# Patient Record
Sex: Male | Born: 1970 | Race: White | Hispanic: No | Marital: Single | Smoking: Former smoker
Health system: Southern US, Community
[De-identification: ages and names within clinical notes are randomized; demographics above are authoritative.]

## PROBLEM LIST (undated history)

## (undated) DIAGNOSIS — E119 Type 2 diabetes mellitus without complications: Secondary | ICD-10-CM

## (undated) DIAGNOSIS — E039 Hypothyroidism, unspecified: Secondary | ICD-10-CM

## (undated) DIAGNOSIS — R4701 Aphasia: Secondary | ICD-10-CM

## (undated) DIAGNOSIS — I509 Heart failure, unspecified: Secondary | ICD-10-CM

## (undated) DIAGNOSIS — M1990 Unspecified osteoarthritis, unspecified site: Secondary | ICD-10-CM

## (undated) DIAGNOSIS — I739 Peripheral vascular disease, unspecified: Secondary | ICD-10-CM

## (undated) DIAGNOSIS — G4733 Obstructive sleep apnea (adult) (pediatric): Secondary | ICD-10-CM

## (undated) DIAGNOSIS — F32A Depression, unspecified: Secondary | ICD-10-CM

## (undated) DIAGNOSIS — J45909 Unspecified asthma, uncomplicated: Secondary | ICD-10-CM

## (undated) DIAGNOSIS — I10 Essential (primary) hypertension: Secondary | ICD-10-CM

## (undated) DIAGNOSIS — R51 Headache: Secondary | ICD-10-CM

## (undated) DIAGNOSIS — I639 Cerebral infarction, unspecified: Secondary | ICD-10-CM

## (undated) DIAGNOSIS — I69351 Hemiplegia and hemiparesis following cerebral infarction affecting right dominant side: Secondary | ICD-10-CM

## (undated) DIAGNOSIS — I2510 Atherosclerotic heart disease of native coronary artery without angina pectoris: Secondary | ICD-10-CM

## (undated) DIAGNOSIS — R0602 Shortness of breath: Secondary | ICD-10-CM

## (undated) DIAGNOSIS — F329 Major depressive disorder, single episode, unspecified: Secondary | ICD-10-CM

## (undated) DIAGNOSIS — D649 Anemia, unspecified: Secondary | ICD-10-CM

## (undated) HISTORY — PX: THYROIDECTOMY, PARTIAL: SHX18

---

## 2013-11-04 ENCOUNTER — Emergency Department (HOSPITAL_COMMUNITY): Payer: Self-pay

## 2013-11-04 ENCOUNTER — Inpatient Hospital Stay (HOSPITAL_COMMUNITY)
Admission: EM | Admit: 2013-11-04 | Discharge: 2013-11-08 | DRG: 193 | Disposition: A | Discharge status: Home / Self Care | Payer: Self-pay | Attending: Pulmonary Disease | Admitting: Pulmonary Disease

## 2013-11-04 ENCOUNTER — Encounter (HOSPITAL_COMMUNITY): Payer: Self-pay | Admitting: Emergency Medicine

## 2013-11-04 DIAGNOSIS — I509 Heart failure, unspecified: Secondary | ICD-10-CM | POA: Diagnosis present

## 2013-11-04 DIAGNOSIS — J690 Pneumonitis due to inhalation of food and vomit: Secondary | ICD-10-CM

## 2013-11-04 DIAGNOSIS — Z7982 Long term (current) use of aspirin: Secondary | ICD-10-CM

## 2013-11-04 DIAGNOSIS — E119 Type 2 diabetes mellitus without complications: Secondary | ICD-10-CM | POA: Diagnosis present

## 2013-11-04 DIAGNOSIS — R609 Edema, unspecified: Secondary | ICD-10-CM

## 2013-11-04 DIAGNOSIS — E039 Hypothyroidism, unspecified: Secondary | ICD-10-CM | POA: Diagnosis present

## 2013-11-04 DIAGNOSIS — J9600 Acute respiratory failure, unspecified whether with hypoxia or hypercapnia: Secondary | ICD-10-CM | POA: Diagnosis present

## 2013-11-04 DIAGNOSIS — I69959 Hemiplegia and hemiparesis following unspecified cerebrovascular disease affecting unspecified side: Secondary | ICD-10-CM

## 2013-11-04 DIAGNOSIS — J189 Pneumonia, unspecified organism: Principal | ICD-10-CM | POA: Diagnosis present

## 2013-11-04 DIAGNOSIS — E662 Morbid (severe) obesity with alveolar hypoventilation: Secondary | ICD-10-CM | POA: Diagnosis present

## 2013-11-04 DIAGNOSIS — Z6841 Body Mass Index (BMI) 40.0 and over, adult: Secondary | ICD-10-CM

## 2013-11-04 DIAGNOSIS — Z91199 Patient's noncompliance with other medical treatment and regimen due to unspecified reason: Secondary | ICD-10-CM

## 2013-11-04 DIAGNOSIS — J9601 Acute respiratory failure with hypoxia: Secondary | ICD-10-CM | POA: Diagnosis present

## 2013-11-04 DIAGNOSIS — Z794 Long term (current) use of insulin: Secondary | ICD-10-CM

## 2013-11-04 DIAGNOSIS — I10 Essential (primary) hypertension: Secondary | ICD-10-CM | POA: Diagnosis present

## 2013-11-04 DIAGNOSIS — I69920 Aphasia following unspecified cerebrovascular disease: Secondary | ICD-10-CM

## 2013-11-04 DIAGNOSIS — D72829 Elevated white blood cell count, unspecified: Secondary | ICD-10-CM | POA: Diagnosis present

## 2013-11-04 DIAGNOSIS — E739 Lactose intolerance, unspecified: Secondary | ICD-10-CM | POA: Diagnosis present

## 2013-11-04 DIAGNOSIS — N179 Acute kidney failure, unspecified: Secondary | ICD-10-CM | POA: Diagnosis present

## 2013-11-04 DIAGNOSIS — R0689 Other abnormalities of breathing: Secondary | ICD-10-CM | POA: Diagnosis present

## 2013-11-04 DIAGNOSIS — Z79899 Other long term (current) drug therapy: Secondary | ICD-10-CM

## 2013-11-04 DIAGNOSIS — E785 Hyperlipidemia, unspecified: Secondary | ICD-10-CM | POA: Diagnosis present

## 2013-11-04 DIAGNOSIS — I6990 Unspecified sequelae of unspecified cerebrovascular disease: Secondary | ICD-10-CM

## 2013-11-04 DIAGNOSIS — Z87891 Personal history of nicotine dependence: Secondary | ICD-10-CM

## 2013-11-04 DIAGNOSIS — E872 Acidosis, unspecified: Secondary | ICD-10-CM | POA: Diagnosis present

## 2013-11-04 DIAGNOSIS — G4733 Obstructive sleep apnea (adult) (pediatric): Secondary | ICD-10-CM | POA: Diagnosis present

## 2013-11-04 DIAGNOSIS — J811 Chronic pulmonary edema: Secondary | ICD-10-CM

## 2013-11-04 DIAGNOSIS — Z8679 Personal history of other diseases of the circulatory system: Secondary | ICD-10-CM

## 2013-11-04 DIAGNOSIS — J810 Acute pulmonary edema: Secondary | ICD-10-CM | POA: Diagnosis present

## 2013-11-04 DIAGNOSIS — Z9119 Patient's noncompliance with other medical treatment and regimen: Secondary | ICD-10-CM

## 2013-11-04 HISTORY — DX: Depression, unspecified: F32.A

## 2013-11-04 HISTORY — DX: Peripheral vascular disease, unspecified: I73.9

## 2013-11-04 HISTORY — DX: Essential (primary) hypertension: I10

## 2013-11-04 HISTORY — DX: Hypothyroidism, unspecified: E03.9

## 2013-11-04 HISTORY — DX: Heart failure, unspecified: I50.9

## 2013-11-04 HISTORY — DX: Headache: R51

## 2013-11-04 HISTORY — DX: Morbid (severe) obesity due to excess calories: E66.01

## 2013-11-04 HISTORY — DX: Aphasia: R47.01

## 2013-11-04 HISTORY — DX: Obstructive sleep apnea (adult) (pediatric): G47.33

## 2013-11-04 HISTORY — DX: Atherosclerotic heart disease of native coronary artery without angina pectoris: I25.10

## 2013-11-04 HISTORY — DX: Type 2 diabetes mellitus without complications: E11.9

## 2013-11-04 HISTORY — DX: Shortness of breath: R06.02

## 2013-11-04 HISTORY — DX: Hemiplegia and hemiparesis following cerebral infarction affecting right dominant side: I69.351

## 2013-11-04 HISTORY — DX: Major depressive disorder, single episode, unspecified: F32.9

## 2013-11-04 HISTORY — DX: Cerebral infarction, unspecified: I63.9

## 2013-11-04 HISTORY — DX: Unspecified asthma, uncomplicated: J45.909

## 2013-11-04 HISTORY — DX: Unspecified osteoarthritis, unspecified site: M19.90

## 2013-11-04 LAB — CBC WITH DIFFERENTIAL/PLATELET
Basophils Absolute: 0 10*3/uL (ref 0.0–0.1)
Basophils Relative: 0 % (ref 0–1)
EOS ABS: 0 10*3/uL (ref 0.0–0.7)
Eosinophils Relative: 0 % (ref 0–5)
HEMATOCRIT: 37.5 % — AB (ref 39.0–52.0)
Hemoglobin: 13 g/dL (ref 13.0–17.0)
LYMPHS PCT: 7 % — AB (ref 12–46)
Lymphs Abs: 2.1 10*3/uL (ref 0.7–4.0)
MCH: 30.3 pg (ref 26.0–34.0)
MCHC: 34.7 g/dL (ref 30.0–36.0)
MCV: 87.4 fL (ref 78.0–100.0)
MONOS PCT: 2 % — AB (ref 3–12)
Monocytes Absolute: 0.6 10*3/uL (ref 0.1–1.0)
NEUTROS ABS: 27.6 10*3/uL — AB (ref 1.7–7.7)
NEUTROS PCT: 91 % — AB (ref 43–77)
PLATELETS: 318 10*3/uL (ref 150–400)
RBC: 4.29 MIL/uL (ref 4.22–5.81)
RDW: 13.6 % (ref 11.5–15.5)
SMEAR REVIEW: ADEQUATE
WBC: 30.3 10*3/uL — AB (ref 4.0–10.5)

## 2013-11-04 LAB — BLOOD GAS, ARTERIAL
Acid-Base Excess: 1 mmol/L (ref 0.0–2.0)
Acid-base deficit: 0.2 mmol/L (ref 0.0–2.0)
Bicarbonate: 24.5 mEq/L — ABNORMAL HIGH (ref 20.0–24.0)
Bicarbonate: 25.8 mEq/L — ABNORMAL HIGH (ref 20.0–24.0)
DRAWN BY: 31288
DRAWN BY: 41308
Delivery systems: POSITIVE
Delivery systems: POSITIVE
EXPIRATORY PAP: 8
FIO2: 100 %
FIO2: 1 %
INSPIRATORY PAP: 20
LHR: 15 {breaths}/min
MODE: POSITIVE
Mode: POSITIVE
O2 SAT: 94.1 %
O2 Saturation: 94.5 %
PATIENT TEMPERATURE: 98.6
PCO2 ART: 46.5 mmHg — AB (ref 35.0–45.0)
PEEP: 8 cmH2O
PH ART: 7.363 (ref 7.350–7.450)
PO2 ART: 73 mmHg — AB (ref 80.0–100.0)
PRESSURE CONTROL: 12 cmH2O
Patient temperature: 99.5
TCO2: 25.9 mmol/L (ref 0–100)
TCO2: 27.2 mmol/L (ref 0–100)
pCO2 arterial: 45.2 mmHg — ABNORMAL HIGH (ref 35.0–45.0)
pH, Arterial: 7.357 (ref 7.350–7.450)
pO2, Arterial: 68.3 mmHg — ABNORMAL LOW (ref 80.0–100.0)

## 2013-11-04 LAB — URINE MICROSCOPIC-ADD ON

## 2013-11-04 LAB — URINALYSIS, ROUTINE W REFLEX MICROSCOPIC
Bilirubin Urine: NEGATIVE
Ketones, ur: NEGATIVE mg/dL
LEUKOCYTES UA: NEGATIVE
Nitrite: NEGATIVE
PROTEIN: 100 mg/dL — AB
SPECIFIC GRAVITY, URINE: 1.019 (ref 1.005–1.030)
UROBILINOGEN UA: 0.2 mg/dL (ref 0.0–1.0)
pH: 5 (ref 5.0–8.0)

## 2013-11-04 LAB — LACTIC ACID, PLASMA: LACTIC ACID, VENOUS: 0.9 mmol/L (ref 0.5–2.2)

## 2013-11-04 LAB — TROPONIN I
Troponin I: <0.3 ng/mL (ref <0.30)
Troponin I: <0.3 ng/mL (ref <0.30)
Troponin I: <0.3 ng/mL (ref <0.30)

## 2013-11-04 LAB — COMPREHENSIVE METABOLIC PANEL
ALT: 14 U/L (ref 0–53)
AST: 15 U/L (ref 0–37)
Albumin: 3.8 g/dL (ref 3.5–5.2)
Alkaline Phosphatase: 109 U/L (ref 39–117)
BUN: 22 mg/dL (ref 6–23)
CALCIUM: 9 mg/dL (ref 8.4–10.5)
CO2: 22 mEq/L (ref 19–32)
CREATININE: 1.28 mg/dL (ref 0.50–1.35)
Chloride: 96 mEq/L (ref 96–112)
GFR calc Af Amer: 78 mL/min — ABNORMAL LOW (ref >90)
GFR calc non Af Amer: 68 mL/min — ABNORMAL LOW (ref >90)
Glucose, Bld: 319 mg/dL — ABNORMAL HIGH (ref 70–99)
Potassium: 5.4 mEq/L — ABNORMAL HIGH (ref 3.7–5.3)
SODIUM: 133 meq/L — AB (ref 137–147)
TOTAL PROTEIN: 7.5 g/dL (ref 6.0–8.3)
Total Bilirubin: 1.3 mg/dL — ABNORMAL HIGH (ref 0.3–1.2)

## 2013-11-04 LAB — BASIC METABOLIC PANEL
BUN: 28 mg/dL — AB (ref 6–23)
CO2: 22 mEq/L (ref 19–32)
Calcium: 8.6 mg/dL (ref 8.4–10.5)
Chloride: 98 mEq/L (ref 96–112)
Creatinine, Ser: 1.98 mg/dL — ABNORMAL HIGH (ref 0.50–1.35)
GFR calc non Af Amer: 40 mL/min — ABNORMAL LOW (ref >90)
GFR, EST AFRICAN AMERICAN: 46 mL/min — AB (ref >90)
Glucose, Bld: 257 mg/dL — ABNORMAL HIGH (ref 70–99)
POTASSIUM: 4.1 meq/L (ref 3.7–5.3)
Sodium: 135 mEq/L — ABNORMAL LOW (ref 137–147)

## 2013-11-04 LAB — CBC
HEMATOCRIT: 33.5 % — AB (ref 39.0–52.0)
Hemoglobin: 11.5 g/dL — ABNORMAL LOW (ref 13.0–17.0)
MCH: 30.3 pg (ref 26.0–34.0)
MCHC: 34.3 g/dL (ref 30.0–36.0)
MCV: 88.4 fL (ref 78.0–100.0)
Platelets: 261 10*3/uL (ref 150–400)
RBC: 3.79 MIL/uL — ABNORMAL LOW (ref 4.22–5.81)
RDW: 13.8 % (ref 11.5–15.5)
WBC: 19.9 10*3/uL — ABNORMAL HIGH (ref 4.0–10.5)

## 2013-11-04 LAB — GLUCOSE, CAPILLARY
GLUCOSE-CAPILLARY: 204 mg/dL — AB (ref 70–99)
GLUCOSE-CAPILLARY: 247 mg/dL — AB (ref 70–99)
Glucose-Capillary: 257 mg/dL — ABNORMAL HIGH (ref 70–99)
Glucose-Capillary: 195 mg/dL — ABNORMAL HIGH (ref 70–99)
Glucose-Capillary: 166 mg/dL — ABNORMAL HIGH (ref 70–99)
Glucose-Capillary: 224 mg/dL — ABNORMAL HIGH (ref 70–99)
Glucose-Capillary: 192 mg/dL — ABNORMAL HIGH (ref 70–99)

## 2013-11-04 LAB — I-STAT ARTERIAL BLOOD GAS, ED
Acid-base deficit: 2 mmol/L (ref 0.0–2.0)
Bicarbonate: 25.1 mEq/L — ABNORMAL HIGH (ref 20.0–24.0)
O2 SAT: 88 %
PCO2 ART: 53.2 mmHg — AB (ref 35.0–45.0)
PH ART: 7.282 — AB (ref 7.350–7.450)
TCO2: 27 mmol/L (ref 0–100)
pO2, Arterial: 61 mmHg — ABNORMAL LOW (ref 80.0–100.0)

## 2013-11-04 LAB — HEMOGLOBIN A1C
Hgb A1c MFr Bld: 8.8 % — ABNORMAL HIGH (ref <5.7)
Mean Plasma Glucose: 206 mg/dL — ABNORMAL HIGH (ref <117)

## 2013-11-04 LAB — I-STAT TROPONIN, ED: TROPONIN I, POC: 0.02 ng/mL (ref 0.00–0.08)

## 2013-11-04 LAB — INFLUENZA PANEL BY PCR (TYPE A & B)
H1N1 flu by pcr: NOT DETECTED
Influenza A By PCR: NEGATIVE
Influenza B By PCR: NEGATIVE

## 2013-11-04 LAB — MRSA PCR SCREENING: MRSA by PCR: NEGATIVE

## 2013-11-04 LAB — I-STAT CG4 LACTIC ACID, ED: LACTIC ACID, VENOUS: 1.27 mmol/L (ref 0.5–2.2)

## 2013-11-04 LAB — CBG MONITORING, ED: Glucose-Capillary: 264 mg/dL — ABNORMAL HIGH (ref 70–99)

## 2013-11-04 LAB — PRO B NATRIURETIC PEPTIDE: Pro B Natriuretic peptide (BNP): 110.1 pg/mL (ref 0–125)

## 2013-11-04 LAB — TSH: TSH: 0.456 u[IU]/mL (ref 0.350–4.500)

## 2013-11-04 MED ORDER — ALBUTEROL SULFATE (2.5 MG/3ML) 0.083% IN NEBU: 2.5000 mg | INHALATION_SOLUTION | RESPIRATORY_TRACT | Status: DC

## 2013-11-04 MED ORDER — IPRATROPIUM BROMIDE 0.02 % IN SOLN: 0.5000 mg | RESPIRATORY_TRACT | Status: DC | PRN

## 2013-11-04 MED ORDER — FUROSEMIDE 10 MG/ML IJ SOLN
80.0000 mg | Freq: Two times a day (BID) | INTRAMUSCULAR | Status: DC
  Administered 2013-11-04: 80 mg via INTRAVENOUS
  Filled 2013-11-04 (×3): qty 8

## 2013-11-04 MED ORDER — ALBUTEROL SULFATE (2.5 MG/3ML) 0.083% IN NEBU
2.5000 mg | INHALATION_SOLUTION | Freq: Four times a day (QID) | RESPIRATORY_TRACT | Status: DC
  Administered 2013-11-04: 2.5 mg via RESPIRATORY_TRACT
  Filled 2013-11-04: qty 3

## 2013-11-04 MED ORDER — CHLORHEXIDINE GLUCONATE 0.12 % MT SOLN
15.0000 mL | Freq: Two times a day (BID) | OROMUCOSAL | Status: DC
  Administered 2013-11-04 – 2013-11-08 (×7): 15 mL via OROMUCOSAL
  Filled 2013-11-04 (×12): qty 15

## 2013-11-04 MED ORDER — ASPIRIN EC 81 MG PO TBEC
81.0000 mg | DELAYED_RELEASE_TABLET | Freq: Every day | ORAL | Status: DC
  Administered 2013-11-04 – 2013-11-08 (×5): 81 mg via ORAL
  Filled 2013-11-04 (×5): qty 1

## 2013-11-04 MED ORDER — ENOXAPARIN SODIUM 40 MG/0.4ML ~~LOC~~ SOLN
40.0000 mg | SUBCUTANEOUS | Status: DC
  Administered 2013-11-05 – 2013-11-08 (×4): 40 mg via SUBCUTANEOUS
  Filled 2013-11-04 (×6): qty 0.4

## 2013-11-04 MED ORDER — INSULIN GLARGINE 100 UNIT/ML ~~LOC~~ SOLN
25.0000 [IU] | Freq: Every day | SUBCUTANEOUS | Status: DC
  Administered 2013-11-04 – 2013-11-07 (×4): 25 [IU] via SUBCUTANEOUS
  Filled 2013-11-04 (×6): qty 0.25

## 2013-11-04 MED ORDER — ALBUTEROL SULFATE (2.5 MG/3ML) 0.083% IN NEBU
2.5000 mg | INHALATION_SOLUTION | Freq: Four times a day (QID) | RESPIRATORY_TRACT | Status: DC
  Administered 2013-11-04 – 2013-11-06 (×9): 2.5 mg via RESPIRATORY_TRACT
  Filled 2013-11-04 (×9): qty 3

## 2013-11-04 MED ORDER — DEXTROSE 5 % IV SOLN
500.0000 mg | INTRAVENOUS | Status: DC
  Administered 2013-11-04 – 2013-11-05 (×2): 500 mg via INTRAVENOUS
  Filled 2013-11-04 (×4): qty 500

## 2013-11-04 MED ORDER — BIOTENE DRY MOUTH MT LIQD
15.0000 mL | Freq: Two times a day (BID) | OROMUCOSAL | Status: DC
  Administered 2013-11-04 – 2013-11-08 (×8): 15 mL via OROMUCOSAL

## 2013-11-04 MED ORDER — DEXTROSE 5 % IV SOLN: 500.0000 mg | Freq: Once | INTRAVENOUS | Status: DC

## 2013-11-04 MED ORDER — NYSTATIN 100000 UNIT/GM EX POWD
Freq: Three times a day (TID) | CUTANEOUS | Status: DC
  Administered 2013-11-04 – 2013-11-08 (×13): via TOPICAL
  Filled 2013-11-04: qty 15

## 2013-11-04 MED ORDER — CARVEDILOL 3.125 MG PO TABS
3.1250 mg | ORAL_TABLET | Freq: Two times a day (BID) | ORAL | Status: DC
  Administered 2013-11-04: 3.125 mg via ORAL
  Filled 2013-11-04 (×3): qty 1

## 2013-11-04 MED ORDER — DEXTROSE 5 % IV SOLN
1.0000 g | INTRAVENOUS | Status: DC
  Filled 2013-11-04: qty 10

## 2013-11-04 MED ORDER — IPRATROPIUM-ALBUTEROL 0.5-2.5 (3) MG/3ML IN SOLN: 3.0000 mL | RESPIRATORY_TRACT | Status: DC | PRN

## 2013-11-04 MED ORDER — ATORVASTATIN CALCIUM 80 MG PO TABS
80.0000 mg | ORAL_TABLET | Freq: Every day | ORAL | Status: DC
  Administered 2013-11-06 – 2013-11-07 (×2): 80 mg via ORAL
  Filled 2013-11-04 (×6): qty 1

## 2013-11-04 MED ORDER — ALBUTEROL SULFATE (2.5 MG/3ML) 0.083% IN NEBU: 2.5000 mg | INHALATION_SOLUTION | RESPIRATORY_TRACT | Status: DC | PRN

## 2013-11-04 MED ORDER — CARVEDILOL 3.125 MG PO TABS
3.1250 mg | ORAL_TABLET | Freq: Two times a day (BID) | ORAL | Status: DC
  Administered 2013-11-05 – 2013-11-08 (×7): 3.125 mg via ORAL
  Filled 2013-11-04 (×11): qty 1

## 2013-11-04 MED ORDER — FUROSEMIDE 10 MG/ML IJ SOLN
80.0000 mg | Freq: Once | INTRAMUSCULAR | Status: AC
  Administered 2013-11-04: 80 mg via INTRAVENOUS
  Filled 2013-11-04: qty 8

## 2013-11-04 MED ORDER — INSULIN ASPART 100 UNIT/ML ~~LOC~~ SOLN: 0.0000 [IU] | SUBCUTANEOUS | Status: DC

## 2013-11-04 MED ORDER — ENOXAPARIN SODIUM 30 MG/0.3ML ~~LOC~~ SOLN
30.0000 mg | SUBCUTANEOUS | Status: DC
  Administered 2013-11-04: 30 mg via SUBCUTANEOUS
  Filled 2013-11-04 (×2): qty 0.3

## 2013-11-04 MED ORDER — LEVOTHYROXINE SODIUM 200 MCG PO TABS
200.0000 ug | ORAL_TABLET | Freq: Every day | ORAL | Status: DC
  Administered 2013-11-05 – 2013-11-08 (×5): 200 ug via ORAL
  Filled 2013-11-04 (×5): qty 1

## 2013-11-04 MED ORDER — IPRATROPIUM-ALBUTEROL 0.5-2.5 (3) MG/3ML IN SOLN: 3.0000 mL | Freq: Once | RESPIRATORY_TRACT | Status: DC

## 2013-11-04 MED ORDER — SODIUM CHLORIDE 0.9 % IV SOLN: 250.0000 mL | INTRAVENOUS | Status: DC | PRN

## 2013-11-04 MED ORDER — INSULIN ASPART 100 UNIT/ML ~~LOC~~ SOLN
0.0000 [IU] | SUBCUTANEOUS | Status: DC
  Administered 2013-11-04: 3 [IU] via SUBCUTANEOUS
  Administered 2013-11-04: 8 [IU] via SUBCUTANEOUS
  Administered 2013-11-04: 3 [IU] via SUBCUTANEOUS
  Administered 2013-11-04: 8 [IU] via SUBCUTANEOUS
  Administered 2013-11-04: 3 [IU] via SUBCUTANEOUS
  Administered 2013-11-04: 2 [IU] via SUBCUTANEOUS
  Administered 2013-11-05: 3 [IU] via SUBCUTANEOUS
  Administered 2013-11-05: 11 [IU] via SUBCUTANEOUS
  Administered 2013-11-05: 5 [IU] via SUBCUTANEOUS
  Administered 2013-11-05: 3 [IU] via SUBCUTANEOUS
  Administered 2013-11-05: 8 [IU] via SUBCUTANEOUS
  Administered 2013-11-05 – 2013-11-06 (×2): 3 [IU] via SUBCUTANEOUS

## 2013-11-04 MED ORDER — VITAMIN D (ERGOCALCIFEROL) 1.25 MG (50000 UNIT) PO CAPS
50000.0000 [IU] | ORAL_CAPSULE | ORAL | Status: DC
  Administered 2013-11-06 – 2013-11-08 (×2): 50000 [IU] via ORAL
  Filled 2013-11-04 (×2): qty 1

## 2013-11-04 MED ORDER — GABAPENTIN 100 MG PO CAPS
100.0000 mg | ORAL_CAPSULE | Freq: Two times a day (BID) | ORAL | Status: DC
  Administered 2013-11-04 – 2013-11-08 (×9): 100 mg via ORAL
  Filled 2013-11-04 (×10): qty 1

## 2013-11-04 MED ORDER — PIPERACILLIN-TAZOBACTAM 3.375 G IVPB
3.3750 g | Freq: Three times a day (TID) | INTRAVENOUS | Status: DC
  Administered 2013-11-04 – 2013-11-06 (×7): 3.375 g via INTRAVENOUS
  Filled 2013-11-04 (×10): qty 50

## 2013-11-04 MED ORDER — BUPROPION HCL 75 MG PO TABS
75.0000 mg | ORAL_TABLET | Freq: Two times a day (BID) | ORAL | Status: DC
  Administered 2013-11-04 – 2013-11-08 (×9): 75 mg via ORAL
  Filled 2013-11-04 (×10): qty 1

## 2013-11-04 MED ORDER — SODIUM CHLORIDE 0.9 % IJ SOLN: 3.0000 mL | INTRAMUSCULAR | Status: DC | PRN

## 2013-11-04 MED ORDER — NEBIVOLOL HCL 2.5 MG PO TABS: 2.5000 mg | ORAL_TABLET | Freq: Every day | ORAL | Status: DC

## 2013-11-04 MED ORDER — FUROSEMIDE 10 MG/ML IJ SOLN
40.0000 mg | Freq: Two times a day (BID) | INTRAMUSCULAR | Status: AC
  Administered 2013-11-04 – 2013-11-05 (×2): 40 mg via INTRAVENOUS
  Filled 2013-11-04: qty 4

## 2013-11-04 MED ORDER — DEXTROSE 5 % IV SOLN
1.0000 g | Freq: Once | INTRAVENOUS | Status: AC
  Administered 2013-11-04: 1 g via INTRAVENOUS
  Filled 2013-11-04: qty 10

## 2013-11-04 MED ORDER — SODIUM CHLORIDE 0.9 % IJ SOLN
3.0000 mL | Freq: Two times a day (BID) | INTRAMUSCULAR | Status: DC
  Administered 2013-11-04 – 2013-11-07 (×8): 3 mL via INTRAVENOUS

## 2013-11-04 NOTE — ED Provider Notes (Signed)
Ultrasound Guided Angiocath insertion Performed by: Pilar JarvisBrtalik, Laurinda Carreno  Consent: Verbal consent obtained. Risks and benefits: risks, benefits and alternatives were discussed Time out: Immediately prior to procedure a "time out" was called to verify the correct patient, procedure, equipment, support staff and site/side marked as required. Preparation: Patient was prepped and draped in the usual sterile fashion. Vein Location: L AC Ultrasound Guided Gauge: 20 ga Normal blood return and flush without difficulty Patient tolerance: Patient tolerated the procedure well with no immediate complications.     Pilar Jarvisoug Kenyata Napier, MD 11/04/13 0030

## 2013-11-04 NOTE — Progress Notes (Signed)
Pt transported on Servo-bipap to 45M w/ no apparent complications.  Report given to unit RT.

## 2013-11-04 NOTE — ED Notes (Signed)
Patient has been experiencing respiratory distress x2 days which worsened today. EMS placed patient on CPAP and got a low sat of 60% and a high sat of 88%. Patient has rales in all 4 lobes. 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 Patient has peripheral swelling which wife states is worse than normal. BP 128/86 HR 90.

## 2013-11-04 NOTE — ED Provider Notes (Signed)
CSN: [id number]     Arrival date & time 11/04/13  0011 History   First MD Initiated Contact with Patient 11/04/13 0022     Chief Complaint  Patient presents with  . Respiratory Distress     (Consider location/radiation/quality/duration/timing/severity/associated sxs/prior Treatment) The history is provided by the EMS personnel. The history is limited by the condition of the patient (Altered mental status).   43 year old male is brought in by EMS because of respiratory difficulty. Patient is unable to give any history but he apparently has been having difficulty breathing for 2 days. He was noted to be hypoxic en route. He was started on BiPAP on arrival in the ED.  History reviewed. No pertinent past medical history. History reviewed. No pertinent past surgical history. No family history on file. History  Substance Use Topics  . Smoking status: Not on file  . Smokeless tobacco: Not on file  . Alcohol Use: Not on file    Review of Systems  Unable to perform ROS: Mental status change      Allergies  Review of patient's allergies indicates not on file.  Home Medications  No current outpatient prescriptions on file. BP 122/77  Temp(Src) 98.4 F (36.9 C) (Axillary)  Resp 33  SpO2 98% Physical Exam  Nursing note and vitals reviewed.  Morbidly obese 43 year old male, resting comfortably and in no acute distress. Vital signs are significant for tachypnea with respiratory rate of. Oxygen saturation is 98%, which is normal. Head is normocephalic and atraumatic. PERRLA, EOMI. Oropharynx is clear. Neck is nontender and supple without adenopathy or JVD. Back is nontender and there is no CVA tenderness. Lungs have scattered the expiratory wheezes and bibasilar rales. Chest is nontender. Heart has regular rate and rhythm without murmur. Abdomen is soft, flat, nontender without masses or hepatosplenomegaly and peristalsis is normoactive. Extremities have 3+ edema, full range of  motion is present. Skin is warm and dry without rash. Neurologic: He is awake but non-verbal, does not follow commands, cranial nerves are intact, there are no gross motor or sensory deficits.  ED Course  Procedures (including critical care time) Labs Review Results for orders placed during the hospital encounter of 11/04/13  CBC WITH DIFFERENTIAL      Result Value Ref Range   WBC 30.3 (*) 4.0 - 10.5 K/uL   RBC 4.29  4.22 - 5.81 MIL/uL   Hemoglobin 13.0  13.0 - 17.0 g/dL   HCT 74.2 (*) 59.5 - 63.8 %   MCV 87.4  78.0 - 100.0 fL   MCH 30.3  26.0 - 34.0 pg   MCHC 34.7  30.0 - 36.0 g/dL   RDW 75.6  43.3 - 29.5 %   Platelets 318  150 - 400 K/uL   Neutrophils Relative % 91 (*) 43 - 77 %   Lymphocytes Relative 7 (*) 12 - 46 %   Monocytes Relative 2 (*) 3 - 12 %   Eosinophils Relative 0  0 - 5 %   Basophils Relative 0  0 - 1 %   Neutro Abs 27.6 (*) 1.7 - 7.7 K/uL   Lymphs Abs 2.1  0.7 - 4.0 K/uL   Monocytes Absolute 0.6  0.1 - 1.0 K/uL   Eosinophils Absolute 0.0  0.0 - 0.7 K/uL   Basophils Absolute 0.0  0.0 - 0.1 K/uL   RBC Morphology POLYCHROMASIA PRESENT     WBC Morphology MILD LEFT SHIFT (1-5% METAS, OCC MYELO, OCC BANDS)     Smear Review PLATELETS  APPEAR ADEQUATE    COMPREHENSIVE METABOLIC PANEL      Result Value Ref Range   Sodium 133 (*) 137 - 147 mEq/L   Potassium 5.4 (*) 3.7 - 5.3 mEq/L   Chloride 96  96 - 112 mEq/L   CO2 22  19 - 32 mEq/L   Glucose, Bld 319 (*) 70 - 99 mg/dL   BUN 22  6 - 23 mg/dL   Creatinine, Ser 1.61  0.50 - 1.35 mg/dL   Calcium 9.0  8.4 - 09.6 mg/dL   Total Protein 7.5  6.0 - 8.3 g/dL   Albumin 3.8  3.5 - 5.2 g/dL   AST 15  0 - 37 U/L   ALT 14  0 - 53 U/L   Alkaline Phosphatase 109  39 - 117 U/L   Total Bilirubin 1.3 (*) 0.3 - 1.2 mg/dL   GFR calc non Af Amer 68 (*) >90 mL/min   GFR calc Af Amer 78 (*) >90 mL/min  PRO B NATRIURETIC PEPTIDE      Result Value Ref Range   Pro B Natriuretic peptide (BNP) 110.1  0 - 125 pg/mL  URINALYSIS, ROUTINE  W REFLEX MICROSCOPIC      Result Value Ref Range   Color, Urine YELLOW  YELLOW   APPearance CLOUDY (*) CLEAR   Specific Gravity, Urine 1.019  1.005 - 1.030   pH 5.0  5.0 - 8.0   Glucose, UA >1000 (*) NEGATIVE mg/dL   Hgb urine dipstick LARGE (*) NEGATIVE   Bilirubin Urine NEGATIVE  NEGATIVE   Ketones, ur NEGATIVE  NEGATIVE mg/dL   Protein, ur 045 (*) NEGATIVE mg/dL   Urobilinogen, UA 0.2  0.0 - 1.0 mg/dL   Nitrite NEGATIVE  NEGATIVE   Leukocytes, UA NEGATIVE  NEGATIVE  URINE MICROSCOPIC-ADD ON      Result Value Ref Range   WBC, UA 0-2  <3 WBC/hpf   RBC / HPF 21-50  <3 RBC/hpf   Bacteria, UA RARE  RARE   Casts HYALINE CASTS (*) NEGATIVE  I-STAT CG4 LACTIC ACID, ED      Result Value Ref Range   Lactic Acid, Venous 1.27  0.5 - 2.2 mmol/L  I-STAT TROPOININ, ED      Result Value Ref Range   Troponin i, poc 0.02  0.00 - 0.08 ng/mL   Comment 3           I-STAT ARTERIAL BLOOD GAS, ED      Result Value Ref Range   pH, Arterial 7.282 (*) 7.350 - 7.450   pCO2 arterial 53.2 (*) 35.0 - 45.0 mmHg   pO2, Arterial 61.0 (*) 80.0 - 100.0 mmHg   Bicarbonate 25.1 (*) 20.0 - 24.0 mEq/L   TCO2 27  0 - 100 mmol/L   O2 Saturation 88.0     Acid-base deficit 2.0  0.0 - 2.0 mmol/L   Patient temperature 98.6 F     Collection site RADIAL, ALLEN'S TEST ACCEPTABLE     Drawn by Operator     Sample type ARTERIAL    CBG MONITORING, ED      Result Value Ref Range   Glucose-Capillary 264 (*) 70 - 99 mg/dL   Imaging Review Dg Chest Port 1 View  11/04/2013   CLINICAL DATA:  Shortness of breath  EXAM: PORTABLE CHEST - 1 VIEW  COMPARISON:  None.  FINDINGS: Multifocal patchy opacities in the lungs bilaterally. Differential considerations include multifocal pneumonia or moderate to severe pulmonary edema.  No pneumothorax.  Cardiomegaly.  IMPRESSION: Multifocal patchy opacities bilaterally, possibly reflecting multifocal pneumonia or moderate to severe pulmonary edema.   Electronically Signed   By: Charline BillsSriyesh   Krishnan M.D.   On: 11/04/2013 00:52   Images viewed by me.   EKG Interpretation   Date/Time:  Saturday November 04 2013 00:24:54 EDT Ventricular Rate:  90 PR Interval:  159 QRS Duration: 96 QT Interval:  400 QTC Calculation: 489 R Axis:   -17 Text Interpretation:  Age not entered, assumed to be  43 years old for  purpose of ECG interpretation Sinus rhythm Probable left atrial  enlargement Borderline left axis deviation Anteroseptal infarct, age  indeterminate T wave inversion in the  Lateral leads No old tracing to  compare Confirmed by [facility]  MD, Martesha Niedermeier ([id number]) on 11/04/2013 2:16:38 AM      CRITICAL CARE Performed by: FAOZH,YQMVHGLICK,Ramond Darnell Total critical care time: 65 minutes Critical care time was exclusive of separately billable procedures and treating other patients. Critical care was necessary to treat or prevent imminent or life-threatening deterioration. Critical care was time spent personally by me on the following activities: development of treatment plan with patient and/or surrogate as well as nursing, discussions with consultants, evaluation of patient's response to treatment, examination of patient, obtaining history from patient or surrogate, ordering and performing treatments and interventions, ordering and review of laboratory studies, ordering and review of radiographic studies, pulse oximetry and re-evaluation of patient's condition.  MDM   Final diagnoses:  Acute respiratory failure  Peripheral edema  Leukocytosis    Respiratory failure which appears to have multiple causes. He clinically does appear to have CHF and certainly has some component of pickwickian syndrome. He may also have component of congestive heart failure. On BiPAP, he is maintaining adequate oxygen saturations. Workup has been initiated. He has no old records at this institution.  He continues to maintain adequate oxygen saturations on BiPAP. X-ray shows almost complete white out of lungs which is  probably due to CHF. However, BNP has actually come back normal. WBC is markedly elevated at 30,000. Therefore, he is empirically started on antibiotics to treat community-acquired pneumonia with ceftriaxone and azithromycin. He is also started on furosemide. Blood gas appears to be venous based on O2 saturation relative to pulse oximetry and shows modest CO2 retention. Case is discussed with Dr. Lovell SheehanJenkins of [facility] who agrees to admit the patient.  Ultrasound guided IV placement was done by resident under my direct supervision. I was present for all key components of the procedure.  Dione Boozeavid Eilah Common, MD 11/04/13 [phone]

## 2013-11-04 NOTE — Progress Notes (Signed)
Inpatient Diabetes Program Recommendations  AACE/ADA: New Consensus Statement on Inpatient Glycemic Control (2013)  Target Ranges:  Prepandial:   less than 140 mg/dL      Peak postprandial:   less than 180 mg/dL (1-2 hours)      Critically ill patients:  140 - 180 mg/dL    Results for Patrick Case, Patrick Case (MRN [id number]) as of 11/04/2013 13:04  Ref. Range 11/04/2013 00:29 11/04/2013 04:15 11/04/2013 08:43 11/04/2013 08:59 11/04/2013 11:52  Glucose-Capillary Latest Range: 70-99 mg/dL 213264 (H) 086257 (H) 578204 (H) 195 (H) 166 (H)      Reason for Assessment: Received referral for this patient. Patient admitted with SOB/PNA.  Has history of DM2, CVA. HTN.  Diabetes history: Type 2 DM Home DM meds: Lantus 25 units QHS + Humalog 3-15 units tid per SSI [facility] [place] (per Dr. Clarnce FlockHarvette's H&P)   **Patient was started on Novolog Moderate SSI Q4 hours at 4am.  CBGs have started to improve.  **Note patient to start his home dose of Lantus tonight.  A1c has been ordered to assess pre-hospital glucose control.  **Patient to transfer to unit 56M ICU today.   Will follow. Ambrose FinlandJeannine Johnston Raziel Koenigs RN, MSN, CDE Diabetes Coordinator Inpatient Diabetes Program Team Pager: [phone] (8a-10p)

## 2013-11-04 NOTE — Progress Notes (Signed)
[facility] PROGRESS NOTE  Quentin Mullingatrick Yebra ZOX:[id number]:[id number] DOB: 08/15/1970 DOA: 11/04/2013 PCP: No PCP Per Patient  Brief narrative: 43 year old male with past medical history of CHF (no records of 2 D ECHO in EPIC but this admission ECHO showed normal EF), history of CVA and right sided hemiparesis, hypertension, diabetes, OHS/OSA who [facility]04/2013 with worsening shortness of breath, cough, orthopnea and leg swelling for past few days prior to this admission. Patient reported cough productive of yellowish sputum. In ED, patient was found to be hypoxic based on ABG findings of pH 7.28, pCO2 53 and pO2 61. BP was 114/49, Tmax was 100.1 F and RR 34, HR 94. CXR showed multifocal pneumonia and possible pulmonary edema. His BNP was WNL. Blood work further showed leukocytosis of 30. He was started on azithromycin and rocephin for treatment of pneumonia. He also required BiPAP on admission.  Assessment/Plan:  Principal Problem:   Acute respiratory failure with hypoxia  - likely due to OSA/OHS and pneumonia - respiratory status stable on BiPAP. Appreciate PCCM consult and recommendations - pt is on azithromycin and rocephin; blood cultures were not obtained at the time of the admission but we will obtain it now; also follow up on legionella, strep pneumonia, influenza and resp culture results - Albuterol and Atrovent nebulizers as needed and scheduled  Active Problems:   CAP (community acquired pneumonia) - on azithromycin and rocephin - follow up blood culture, resp culture, legionella, strep pneumonia, influenza results   OSA (obstructive sleep apnea), Obesity hypoventilation syndrome - on BiPAP   History of CHF (congestive heart failure) - normal BNP and normal EF on this admission - will restart low dose coreg 3.125 mg BID and hold Norvasc and lisinopril due to soft BP - lasix 40 mg IV BID   Hypertension - Soft BP so hold norvasc and lisinopril - will continue coreg but low  dose 3.125 mg BID   Diabetes mellitus - check A1c - restart home Lantus 25 units at bedtime - gabapentin for neuropathy   Unspecified hypothyroidism - continue levothyroxine   Leukocytosis - secondary to pneumonia   Morbid obesity - nutrition consulted   Code Status: full code Family Communication: no family at the bedside  Disposition Plan: home when stable   Manson PasseyEVINE, Ariannie Penaloza, MD  [facility] Pager [phone]  If 7PM-7AM, please contact night-coverage [email or web address] Password [email or web address] 11/04/2013, 10:30 AM   LOS: 0 days   Consultants:  PCCM  Procedures:  None   Antibiotics:  Azithromycin 3/28 -->  Rocephin 3/28 -->  HPI/Subjective: On BiPAP, no distress.  Objective: Filed Vitals:   11/04/13 0500 11/04/13 0606 11/04/13 0800 11/04/13 0806  BP:  114/49 111/52   Pulse:  86 92   Temp:   100.1 F (37.8 C)   TempSrc:   Axillary   Resp:   28   Height:      Weight: 144.5 kg (318 lb 9 oz)     SpO2:   97% 95%    Intake/Output Summary (Last 24 hours) at 11/04/13 1030 Last data filed at 11/04/13 0700  Gross per 24 hour  Intake    160 ml  Output   1650 ml  Net  -1490 ml    Exam:   General:  Pt is alert, has BiPAP, morbidly obese  Cardiovascular: Regular rate and rhythm, S1/S2 appreciated  Respiratory: diminished breath sounds bilaterally, no wheezing   Abdomen: Soft, non tender, obese, non distended, bowel sounds present, no guarding  Extremities: +  1-2 LE pitting edema, pulses DP and PT palpable bilaterally  Neuro: Grossly nonfocal  Data Reviewed: Basic Metabolic Panel:  Recent Labs Lab 11/04/13 0031  NA 133*  K 5.4*  CL 96  CO2 22  GLUCOSE 319*  BUN 22  CREATININE 1.28  CALCIUM 9.0   Liver Function Tests:  Recent Labs Lab 11/04/13 0031  AST 15  ALT 14  ALKPHOS 109  BILITOT 1.3*  PROT 7.5  ALBUMIN 3.8   No results found for this basename: LIPASE, AMYLASE,  in the last 168 hours No results found for this basename: AMMONIA,  in  the last 168 hours CBC:  Recent Labs Lab 11/04/13 0031  WBC 30.3*  NEUTROABS 27.6*  HGB 13.0  HCT 37.5*  MCV 87.4  PLT 318   Cardiac Enzymes:  Recent Labs Lab 11/04/13 0600  TROPONINI <0.30   BNP: No components found with this basename: POCBNP,  CBG:  Recent Labs Lab 11/04/13 0029 11/04/13 0415 11/04/13 0843 11/04/13 0859  GLUCAP 264* 257* 204* 195*    MRSA PCR SCREENING     Status: None   Collection Time    11/04/13  4:45 AM      Result Value Ref Range Status   MRSA by PCR NEGATIVE  NEGATIVE Final     Studies: Dg Chest Port 1 View 11/04/2013    IMPRESSION: Multifocal patchy opacities bilaterally, possibly reflecting multifocal pneumonia or moderate to severe pulmonary edema.   Electronically Signed   By: Charline Bills M.D.   On: 11/04/2013 00:52    Scheduled Meds: . albuterol  2.5 mg Nebulization Q6H  . aspirin EC  81 mg Oral Daily  . atorvastatin  80 mg Oral Daily  . azithromycin  500 mg Intravenous Q24H  . buPROPion  75 mg Oral BID  . carvedilol  3.125 mg Oral BID WC  . cefTRIAXone   1 g Intravenous Q24H  . enoxaparin (LOVENOX)   30 mg Subcutaneous Q24H  . furosemide  40 mg Intravenous Q12H  . gabapentin  100 mg Oral BID  . insulin aspart  0-15 Units Subcutaneous 6 times per day  . insulin glargine  25 Units Subcutaneous QHS  .  levothyroxine  200 mcg Oral QAC breakfast  . nystatin   Topical TID

## 2013-11-04 NOTE — Significant Event (Signed)
Rapid Response Event Note  Overview:  Called by patient placement for help with identifying appropriate unit for patient. 6M has been assigned. Patient is stable and ready for transport.     Initial Focused Assessment:   Interventions:   Event Summary:   at      at          Patrick Case, Fran Mcree Ann

## 2013-11-04 NOTE — Progress Notes (Signed)
Transferred patient to [facility] bed[facility] via bed. Patient A&Ox4 with no signs of any distress, VS stable,and  on Bipap. Oriented to room. Gave report to Pathmark StoresMelinda RN.

## 2013-11-04 NOTE — Progress Notes (Signed)
ANTIBIOTIC CONSULT NOTE - INITIAL  Pharmacy Consult for Zosyn Indication: aspiration PNA  Allergies  Allergen Reactions  . Other Anaphylaxis    Apricots and peaches   . Lactose Intolerance (Gi) Diarrhea    Patient Measurements: Height: 5\' 5"  (165.1 cm) Weight: 318 lb 9 oz (144.5 kg) IBW/kg (Calculated) : 61.5  Vital Signs: Temp: 100.1 F (37.8 C) (03/28 0800) Temp src: Axillary (03/28 0800) BP: 101/50 mmHg (03/28 1110) Pulse Rate: 100 (03/28 1110) Intake/Output from previous day: 03/27 0701 - 03/28 0700 In: 160 [P.O.:120; I.V.:40] Out: 1650 [Urine:1650] Intake/Output from this shift: Total I/O In: 160 [P.O.:120; I.V.:40] Out: 600 [Urine:600]  Labs:  Recent Labs  11/04/13 0031  WBC 30.3*  HGB 13.0  PLT 318  CREATININE 1.28   Estimated Creatinine Clearance: 100.7 ml/min (by C-G formula based on Cr of 1.28). No results found for this basename: VANCOTROUGH, Leodis Binet, VANCORANDOM, GENTTROUGH, GENTPEAK, GENTRANDOM, TOBRATROUGH, TOBRAPEAK, TOBRARND, AMIKACINPEAK, AMIKACINTROU, AMIKACIN,  in the last 72 hours   Microbiology: Recent Results (from the past 720 hour(s))  MRSA PCR SCREENING     Status: None   Collection Time    11/04/13  4:45 AM      Result Value Ref Range Status   MRSA by PCR NEGATIVE  NEGATIVE Final   Comment:            The GeneXpert MRSA Assay (FDA     approved for NASAL specimens     only), is one component of a     comprehensive MRSA colonization     surveillance program. It is not     intended to diagnose MRSA     infection nor to guide or     monitor treatment for     MRSA infections.    Medical History: Past Medical History  Diagnosis Date  . CVA (cerebral infarction) 06/2012 and 07/2012    x 2  . Hypertension   . Diabetes mellitus   . Hypothyroid   . OSA (obstructive sleep apnea)     not on CPAP or BIPAP  . Expressive aphasia   . Hemiparesis affecting right side as late effect of cerebrovascular accident   . Morbid obesity      Medications:  Scheduled:  . albuterol  2.5 mg Nebulization Q6H  . aspirin EC  81 mg Oral Daily  . atorvastatin  80 mg Oral q1800  . azithromycin  500 mg Intravenous Q24H  . buPROPion  75 mg Oral BID  . carvedilol  3.125 mg Oral BID WC  . enoxaparin (LOVENOX) injection  30 mg Subcutaneous Q24H  . furosemide  40 mg Intravenous Q12H  . gabapentin  100 mg Oral BID  . insulin aspart  0-15 Units Subcutaneous 6 times per day  . insulin glargine  25 Units Subcutaneous QHS  . [START ON 11/05/2013] levothyroxine  200 mcg Oral QAC breakfast  . nystatin   Topical TID  . sodium chloride  3 mL Intravenous Q12H  . [START ON 11/06/2013] Vitamin D (Ergocalciferol)  50,000 Units Oral Q M,W,F   Assessment: 43 yo M presented to ED d/t complaints of worsening SOB, cough, orthopnea, and edema over prior 3 days.  CXR revealed pulmonary edema. Patient was placed on Rocephin and Azithromycin.  Now Rocephin has been discontinued and pharmacy has been consulted to add Zosyn for concerns of aspiration pneumonia.  Labs reveal elevated WBC of 30.3 and SCr 1.28 with estimated CrCl >100.    Rocephin 3/28 1gm x1 Azithromycin 3/28>> Zosyn  3/28>>  Goal of Therapy:  Resolution of infection  Plan:  - start Zosyn IV 3.375g q8h (4h extended infusion) - monitor kidney function, WBC, temperature curve, any cultures, and clinical progression  Harrold DonathNathan E. Achilles [place], PharmD Clinical Pharmacist - Resident Pager: [phone] Pharmacy: [phone] 11/04/2013 12:00 PM

## 2013-11-04 NOTE — ED Notes (Signed)
Report received from Fayrene FearingJames, [place]RN. Assumed Care.

## 2013-11-04 NOTE — Consult Note (Signed)
NAMQuentin Mulling:  Chancellor, Eyob              ACCOUNT NO.:  [id number]  MEDICAL RECORD NO.:  [id number]  LOCATION:  2C15C                        FACILITY:  [facility]  PHYSICIAN:  Barbaraann ShareKeith M. Deklyn Trachtenberg, MD,FCCPDATE OF BIRTH:  [birth year] 09[birth year] 1971  DATE OF CONSULTATION:  11/04/2013 DATE OF DISCHARGE:                                CONSULTATION   REFERRING PHYSICIAN:  [facility].  HISTORY OF PRESENT ILLNESS:  The patient is a 43 year old morbidly obese male, who I have been asked to see for acute respiratory failure and abnormal chest x-ray.  The patient has a history of CVA in 2013 with expressive aphasia and right-sided hemiparesis as well as obstructive sleep apnea for which he does not wear a positive pressure device.  He was admitted in the early morning hours with increasing shortness of breath, cough, as well as worsening edema.  In the emergency room, he was found to have diffuse bilateral infiltrates and hypoxemia and hypercarbia.  This was initially felt to be pulmonary edema, however, his brain natriuretic peptide was totally normal.  Ultimately, it was noted that he has purulent mucus, low-grade fever, and a white blood cell count of 30,000 most consistent with pneumonia.  The patient currently is on a bilevel device 12/8 with 100% FiO2 and running saturations of 94%.  He has only mild increased work of breathing surprisingly.  He is he is being treated with Rocephin and azithromycin, with diuresis as well.  PAST MEDICAL HISTORY: 1. Significant for CVA x2. 2. Hypertension. 3. Diabetes. 4. Hypothyroidism. 5. Sleep apnea, for which he is not on a positive pressure device. 6. He is status post partial thyroidectomy.  ALLERGIES: 1. THE PATIENT IS LACTOSE INTOLERANT. 2. ALSO ALLERGIC TO APRICOTS AND PEACHES WHICH CAUSES ANAPHYLAXIS.  SOCIAL HISTORY:  The patient is on disability, and has a long history of tobacco abuse but quit after his CVA in 2013.  Other social history was not able to be  obtained secondary to the patient with acute respiratory failure, on BiPAP.  FAMILY HISTORY:  From the chart shows a history of hypertension, COPD, lung cancer, and diabetes.  REVIEW OF SYSTEMS:  Not able to be obtained because of the patient's respiratory failure and requiring bilevel.  PHYSICAL EXAMINATION:  GENERAL:  He is a morbidly obese male with mild increased work of breathing who was on BiPAP. HEENT:  Pupils equal, round, reactive to light and accommodation. Extraocular muscles are intact.  Nose and oropharynx not able to be examined. NECK:  Large with no obvious palpable thyromegaly or lymphadenopathy. It was impossible to assess for JVD. CHEST:  Very poor depth of inspiration despite bilevel, and diffuse crackles.  There were no wheezes.  CARDIAC:  With mildly tachycardic, but regular rhythm. ABDOMEN:  Soft, nontender, nondistended with good bowel sounds. GENITAL, RECTAL, BREAST EXAMINATION:  Not done, not indicated. EXTREMITIES:  Lower extremities showed 1+ edema, pulses were difficult to palpate but were present.  No calf tenderness noted. NEUROLOGIC:  The patient was able to follow commands and had minimal movement of his right leg, some movement of his right upper extremity, and appeared to be normal in his left upper and left lower extremity.  IMPRESSION:  Acute respiratory  failure secondary to community-acquired pneumonia with possible aspiration.  I cannot exclude the possibility of a component of congestive heart failure, however, I think this is less likely given his clinical presentation at this point.  The patient is requiring bilevel for noninvasive ventilator support, and I am very concerned that he will decompensate further and require endotracheal intubation with mechanical ventilation.  He is morbidly obese with small lung volumes and has poor reserve.  I think he would benefit from transfer to the ICU for closer monitoring in the event of  further decompensation.  SUGGESTIONS: 1. Transfer the patient to the ICU for further monitoring. 2. Would change Rocephin to Zosyn in order to better cover for     possible aspiration. 3. Add albuterol nebulizers for pulmonary toilet. 4. Continue noninvasive positive pressure ventilation at this point. 5. Agree with gentle diuresis, but will need to watch blood pressure     and renal function very closely. 6. Aggressive treatment of hyperglycemia.  Time spent with the patient in reviewing his records and x-rays was approximately 42 minutes.     Barbaraann Share, MD,FCCP     [facility]  D:  11/04/2013  T:  11/04/2013  Job:  [id number]

## 2013-11-04 NOTE — H&P (Signed)
[facility] History and Physical  Patrick Case ZOX:[id number] DOB: 1971/02/16 DOA: 11/04/2013  Referring physician:  EDP PCP:   Dr.  Zonia Case Patrick Case, ) Specialists:   Chief Complaint:  SOB   HPI: Patrick Case is a 43 y.o. male with a history of CVA (in 2013)with Expressive Aphasia and Right sided Hemiparesis, HTN, DM2, and OSA who was sent to the ED due to complaints of worsening SOB, increased cough and orthopnea and edema over the past 3 days.  He and his wife are visiting their daughter who is a Consulting civil engineer a Chartered certified accountant and they are from the [employer].   He developed worsening cough today and the sputum began as clear but progressed to an orange mucus.  He denies any fevers or chill or chest pain.   In the ED , he was evaluated and was found to be hypoxic and an ABG was done which revealed Respiratory Acidosis with CO2 retention.  His Chest X-ray  Revealed florid Pulmonary Edema.   He was placed on BIPAP and had improvement.   He was also placed on antibiotic therapy to cover CAP due to his cough and leukocytosis.    He was referred for medical admission.      Review of Systems:  Constitutional: No Weight Loss, Night Sweats, Fevers, Chills, Fatigue, or +Generalized Weakness HEENT: No Headaches, Difficulty Swallowing,Tooth/Dental Problems,Sore Throat,  No Sneezing, Rhinitis, Ear Ache, Nasal Congestion, or Post Nasal Drip,  Cardio-vascular:  No Chest pain, +Orthopnea, PND, +Edema in lower extremities, Anasarca, Dizziness, Palpitations  Resp: +Dyspnea, +DOE, No +Productive Cough, No Hemoptysis, No Wheezing.    GI: No Heartburn, Indigestion, Abdominal Pain, Nausea, Vomiting, Diarrhea, Change in Bowel Habits,  Loss of Appetite  GU: No Dysuria, Change in Color of Urine, No Urgency or Frequency.  No flank pain.  Musculoskeletal: No Joint Pain or Swelling.  No Decreased Range of Motion. No Back Pain.  Neurologic: No Syncope, No Seizures, +Muscle Weakness, Paresthesia, Vision Disturbance  or Loss, No Diplopia, No Vertigo, +Difficulty Walking,  Skin: No Rash or Lesions. Psych: No Change in Mood or Affect. No Depression or Anxiety. No Memory loss. No Confusion or Hallucinations   Past Medical History  Diagnosis Date  . CVA (cerebral infarction) 06/2012 and 07/2012    x 2  . Hypertension   . Diabetes mellitus   . Hypothyroid   . OSA (obstructive sleep apnea)     not on CPAP or BIPAP  . Expressive aphasia   . Hemiparesis affecting right side as late effect of cerebrovascular accident   . Morbid obesity       Past Surgical History  Procedure Laterality Date  . Thyroidectomy, partial         Prior to Admission medications   Not on File      Allergies  Allergen Reactions  . Other Anaphylaxis    Apricots and peaches      Social History:  Married,  Walks without assistance, Disabled, Former Smoker, Quit after CVAs in 2013.    has no tobacco, alcohol, and drug history on file.     Family History  Problem Relation Age of Onset  . Hypertension Mother   . Hypertension Sister     x 3  . COPD Mother   . Lung cancer Father   . Diabetes Mellitus II Mother        Physical Exam:  GEN:  Pleasant Morbidly Obese  43 y.o. Caucasian male on BIPAP examined  and in no acute  distress; cooperative with exam Filed Vitals:   11/04/13 0030 11/04/13 0100  BP: 134/72 122/77  Temp:  98.4 F (36.9 C)  TempSrc:  Axillary  Resp: 34 33  SpO2:  98%   Blood pressure 122/77, temperature 98.4 F (36.9 C), temperature source Axillary, resp. rate 33, SpO2 98.00%. PSYCH: He is alert and oriented x4; does not appear anxious does not appear depressed; affect is normal HEENT: Normocephalic and Atraumatic, Mucous membranes pink; PERRLA; EOM intact; Fundi:  Benign;  No scleral icterus, Nares: Patent, Oropharynx: Clear,  Fair Dentition, Neck:  FROM, no cervical lymphadenopathy nor thyromegaly or carotid bruit; no JVD; Breasts:: Not examined CHEST WALL: No tenderness CHEST:  Normal respiration, clear to auscultation bilaterally HEART: Regular rate and rhythm; no murmurs rubs or gallops BACK: No kyphosis or scoliosis; no CVA tenderness ABDOMEN: Positive Bowel Sounds, Obese, soft non-tender; no masses, no organomegaly, +Pannus; +Intertriginous candida. Rectal Exam: Not done EXTREMITIES: No cyanosis, clubbing or 3+ EDEMA BLEs,  No ulcerations. Genitalia: not examined PULSES: 2+ and symmetric SKIN: Normal hydration no rash or ulceration CNS:   Alert and Oriented x 4, + Expressive Aphasia and Right Sided Hemiparesis   (Chronic)  Vascular: pulses palpable throughout    Labs on Admission:  Basic Metabolic Panel:  Recent Labs Lab 11/04/13 0031  NA 133*  K 5.4*  CL 96  CO2 22  GLUCOSE 319*  BUN 22  CREATININE 1.28  CALCIUM 9.0   Liver Function Tests:  Recent Labs Lab 11/04/13 0031  AST 15  ALT 14  ALKPHOS 109  BILITOT 1.3*  PROT 7.5  ALBUMIN 3.8   No results found for this basename: LIPASE, AMYLASE,  in the last 168 hours No results found for this basename: AMMONIA,  in the last 168 hours CBC:  Recent Labs Lab 11/04/13 0031  WBC 30.3*  NEUTROABS 27.6*  HGB 13.0  HCT 37.5*  MCV 87.4  PLT 318   Cardiac Enzymes: No results found for this basename: CKTOTAL, CKMB, CKMBINDEX, TROPONINI,  in the last 168 hours  BNP (last 3 results)  Recent Labs  11/04/13 0016  PROBNP 110.1   CBG:  Recent Labs Lab 11/04/13 0029  GLUCAP 264*    Radiological Exams on Admission: Dg Chest Port 1 View  11/04/2013   CLINICAL DATA:  Shortness of breath  EXAM: PORTABLE CHEST - 1 VIEW  COMPARISON:  None.  FINDINGS: Multifocal patchy opacities in the lungs bilaterally. Differential considerations include multifocal pneumonia or moderate to severe pulmonary edema.  No pneumothorax.  Cardiomegaly.  IMPRESSION: Multifocal patchy opacities bilaterally, possibly reflecting multifocal pneumonia or moderate to severe pulmonary edema.   Electronically Signed   By:  Charline BillsSriyesh  Krishnan M.D.   On: 11/04/2013 00:52      EKG: Independently reviewed. Normal sinus Rhythm rate 91,  Ischemic changes in the Infero-lateral leads.   Old Versus new Anterior Infarct    Assessment/Plan:   43 y.o. male with  Principal Problem:   Acute respiratory failure Active Problems:   Acute CHF   CAP (community acquired pneumonia)   Hypercapnia   OSA (obstructive sleep apnea)   Hypertension   Hypothyroid   Diabetes mellitus   Morbid obesity   Late effects of CVA (cerebrovascular accident)   Leukocytosis     1.   Acute Respiratory Failure- Multifactorial- Due to Acute CHF, CAP and OSA,  Placed on BIPAP/O2 and improving.   Re- check ABG in 1 hour.    2.  Acute CHF- placed on the  Acute CHf Protocol to diurese with IV lasix.   Add Carvedilol Rx, but no ACR or ARB due to Renal Insufficiency.  Cycle Troponins.    3.  CAP-  IV Roecphin and Azithromycin Albuterol NEbs, and O2 PRN.    4.  Hypercapnea due to #2  And #1, and due to OSA and Obesity -Hypoventilation Syndrome.   Should Improve with BIPAP.    5.  HTN- PRN IV hydralazine, continue Carvedilol.     6.  DM2- continue Insulin Rx, and SSi coverage PRN, check Hba1C level.    7.  Late Effects CVA- Chronic.    8.  OSA-  Discussed consequences of untreated OSA.         9.  Morbid Obesity-  Weight Loss discussed.    10. Leukocytosis-  Clinical CAP/Infection versus Stress reaction, monitor Trend.         Code Status:      FULL CODE Family Communication:   Wife and Daughter at Bedside  Disposition Plan:     Inpatient  Time spent:  70 minutes  Ron Parker [facility] Pager [phone]  If 7PM-7AM, please contact night-coverage [email or web address] Password [facility] 11/04/2013, 2:54 AM

## 2013-11-04 NOTE — Consult Note (Signed)
Dictation #:  [id number]

## 2013-11-04 NOTE — Progress Notes (Signed)
INITIAL NUTRITION ASSESSMENT  DOCUMENTATION CODES Per approved criteria  -Morbid Obesity   INTERVENTION: 1.  Modify diet; resume PO diet once medically appropriate per MD discretion and as respiratory status improves. 2.  Enteral nutrition; if pt respiratory status does not improve and TF warranted. Day 1 of TF protocol start feed at 25 mL/hr for the remainder of the day.   Day 2 of TF protocol at 0600 start new goal rate of Vital 1.2  1680 mL (70 mL/hr) run from 0600-05:59.   Maintain at rate ordered unless TF's are held. If TF's are held, calculate the new volume to be provided and adjust the rate to provide total volume ordered within 24 hours (total volume-volume fed, divide this by remaining hours in the 24 hr period to get new goal rate). Max rate of 150 mL/hr. Each day at 0600 return to ordered rate. TF would provide 2016 kcal, 126g protein, and 1362 mL free water.   NUTRITION DIAGNOSIS: Inadequate oral intake related to inability to eat as evidenced by NPO, Bi-pap.   Monitor:  1.  Food/Beverage; pt meeting >/=90% estimated needs with tolerance. 2.  Enteral nutrition; initiation with tolerance if required due to inability to meet needs PO.  Pt to meet >/=90% estimated needs with nutrition support.  3.  Wt/wt change; monitor trends 4.  Labs; monitor trends.  Reason for Assessment: consult  43 y.o. male  Admitting Dx: Acute respiratory failure with hypoxia  ASSESSMENT: Pt admitted with acute respiratory failure.  Pt with expressive aphasia and right hemiparesis from previous CVA. Pt currently on Bi-pap, unable to contribute to nutrition hx.  No family at bedside.  RD consulted for assessment.   Pt currently not stable for PO intake; meals held.  Nutrition Focused Physical Exam: Subcutaneous Fat:  Orbital Region: WNL Upper Arm Region: WNL Thoracic and Lumbar Region: WNL  Muscle:  Temple Region: WNL Clavicle Bone Region: WNL Clavicle and Acromion Bone Region:  WNL Scapular Bone Region: WNL Dorsal Hand: WNL Patellar Region: WNL Anterior Thigh Region: WNL Posterior Calf Region: WNL  Edema: none present  Height: Ht Readings from Last 1 Encounters:  11/04/13 5\' 5"  (1.651 m)    Weight: Wt Readings from Last 1 Encounters:  11/04/13 318 lb 9 oz (144.5 kg)    Ideal Body Weight: 61.8 kg  % Ideal Body Weight: 233%  Wt Readings from Last 10 Encounters:  11/04/13 318 lb 9 oz (144.5 kg)    Usual Body Weight: unknown  % Usual Body Weight: unable to assess  BMI:  Body mass index is 53.01 kg/(m^2).  Estimated Nutritional Needs: Kcal: 1880-2100 Protein: 95-110g Fluid: ~1.8 L/day  Skin: intact  Diet Order: Carb Control  EDUCATION NEEDS: -Education not appropriate at this time   Intake/Output Summary (Last 24 hours) at 11/04/13 1640 Last data filed at 11/04/13 1500  Gross per 24 hour  Intake    600 ml  Output   2250 ml  Net  -1650 ml    Last BM: 3/27  Labs:   Recent Labs Lab 11/04/13 0031  NA 133*  K 5.4*  CL 96  CO2 22  BUN 22  CREATININE 1.28  CALCIUM 9.0  GLUCOSE 319*    CBG (last 3)   Recent Labs  11/04/13 0859 11/04/13 1152 11/04/13 1432  GLUCAP 195* 166* 224*    Scheduled Meds: . albuterol  2.5 mg Nebulization Q6H  . antiseptic oral rinse  15 mL Mouth Rinse q12n4p  . aspirin EC  81  mg Oral Daily  . atorvastatin  80 mg Oral q1800  . azithromycin  500 mg Intravenous Q24H  . buPROPion  75 mg Oral BID  . carvedilol  3.125 mg Oral BID WC  . chlorhexidine  15 mL Mouth Rinse BID  . [START ON 11/05/2013] enoxaparin (LOVENOX) injection  40 mg Subcutaneous Q24H  . furosemide  40 mg Intravenous Q12H  . gabapentin  100 mg Oral BID  . insulin aspart  0-15 Units Subcutaneous 6 times per day  . insulin glargine  25 Units Subcutaneous QHS  . [START ON 11/05/2013] levothyroxine  200 mcg Oral QAC breakfast  . nystatin   Topical TID  . piperacillin-tazobactam (ZOSYN)  IV  3.375 g Intravenous Q8H  . sodium  chloride  3 mL Intravenous Q12H  . [START ON 11/06/2013] Vitamin D (Ergocalciferol)  50,000 Units Oral Q M,W,F    Continuous Infusions:   Past Medical History  Diagnosis Date  . CVA (cerebral infarction) 06/2012 and 07/2012    x 2  . Hypertension   . Diabetes mellitus   . Hypothyroid   . OSA (obstructive sleep apnea)     not on CPAP or BIPAP  . Expressive aphasia   . Hemiparesis affecting right side as late effect of cerebrovascular accident   . Morbid obesity     Past Surgical History  Procedure Laterality Date  . Thyroidectomy, partial      Loyce DysKacie Zoran Yankee, MS RD LDN Clinical Inpatient Dietitian Pager: [phone] Weekend/After hours pager: [phone]

## 2013-11-05 ENCOUNTER — Inpatient Hospital Stay (HOSPITAL_COMMUNITY): Payer: Self-pay

## 2013-11-05 DIAGNOSIS — J811 Chronic pulmonary edema: Secondary | ICD-10-CM

## 2013-11-05 DIAGNOSIS — I517 Cardiomegaly: Secondary | ICD-10-CM

## 2013-11-05 LAB — BASIC METABOLIC PANEL
BUN: 25 mg/dL — AB (ref 6–23)
CHLORIDE: 98 meq/L (ref 96–112)
CO2: 26 mEq/L (ref 19–32)
Calcium: 9.1 mg/dL (ref 8.4–10.5)
Creatinine, Ser: 1.74 mg/dL — ABNORMAL HIGH (ref 0.50–1.35)
GFR calc non Af Amer: 47 mL/min — ABNORMAL LOW (ref >90)
GFR, EST AFRICAN AMERICAN: 54 mL/min — AB (ref >90)
Glucose, Bld: 175 mg/dL — ABNORMAL HIGH (ref 70–99)
POTASSIUM: 4.4 meq/L (ref 3.7–5.3)
Sodium: 137 mEq/L (ref 137–147)

## 2013-11-05 LAB — STREP PNEUMONIAE URINARY ANTIGEN: STREP PNEUMO URINARY ANTIGEN: NEGATIVE

## 2013-11-05 LAB — GLUCOSE, CAPILLARY
GLUCOSE-CAPILLARY: 138 mg/dL — AB (ref 70–99)
GLUCOSE-CAPILLARY: 227 mg/dL — AB (ref 70–99)
GLUCOSE-CAPILLARY: 332 mg/dL — AB (ref 70–99)
Glucose-Capillary: 167 mg/dL — ABNORMAL HIGH (ref 70–99)
Glucose-Capillary: 161 mg/dL — ABNORMAL HIGH (ref 70–99)
Glucose-Capillary: 341 mg/dL — ABNORMAL HIGH (ref 70–99)
Glucose-Capillary: 260 mg/dL — ABNORMAL HIGH (ref 70–99)

## 2013-11-05 LAB — LEGIONELLA ANTIGEN, URINE: Legionella Antigen, Urine: NEGATIVE

## 2013-11-05 LAB — HEMOGLOBIN A1C
HEMOGLOBIN A1C: 8.8 % — AB (ref <5.7)
Mean Plasma Glucose: 206 mg/dL — ABNORMAL HIGH (ref <117)

## 2013-11-05 MED ORDER — ACETAMINOPHEN 325 MG PO TABS
650.0000 mg | ORAL_TABLET | Freq: Four times a day (QID) | ORAL | Status: DC | PRN
  Administered 2013-11-05 (×2): 650 mg via ORAL
  Filled 2013-11-05 (×2): qty 2

## 2013-11-05 MED ORDER — TRAMADOL HCL 50 MG PO TABS
25.0000 mg | ORAL_TABLET | Freq: Four times a day (QID) | ORAL | Status: DC | PRN
  Administered 2013-11-05 – 2013-11-08 (×3): 25 mg via ORAL
  Filled 2013-11-05 (×3): qty 1

## 2013-11-05 MED ORDER — PERFLUTREN LIPID MICROSPHERE
1.0000 mL | INTRAVENOUS | Status: AC | PRN
  Administered 2013-11-05: 4 mL via INTRAVENOUS
  Filled 2013-11-05: qty 10

## 2013-11-05 NOTE — Progress Notes (Addendum)
Name: Patrick Case MRN: [id number] DOB: 01/07/1971    ADMISSION DATE:  11/04/2013 CONSULTATION DATE:  3/28  [facility]  PRIMARY SERVICE:  [facility]   CHIEF COMPLAINT:  Very short of breath   BRIEF PATIENT DESCRIPTION: 43 yo morbidly obese male hx of CVA and OSA -CPAP noncompliant admitted 3/28 for resp distress requiring BIPAP . PCCM consulted 3/28 moved to ICU .   SIGNIFICANT EVENTS / STUDIES:  Transferred to ICU 3/28 on BIPAP  3/29 2 D echo >>  LINES / TUBES:   CULTURES: 3/28 BC x 2 >> 3/28 Influenza >>  ANTIBIOTICS: 3/28 Azithromycin>> 3/28 Zosyn >>  HISTORY OF PRESENT ILLNESS:   The patient is a 43 year old morbidly obese pulmonary consulted on 3/28  male, who I have been asked to see for acute respiratory failure and  abnormal chest x-ray. The patient has a history of CVA in 2013 with  expressive aphasia and right-sided hemiparesis as well as obstructive  sleep apnea for which he does not wear a positive pressure device. He  was admitted in the early morning hours 3/28 with increasing shortness of  breath, cough, as well as worsening edema. In the emergency room, he  was found to have diffuse bilateral infiltrates and hypoxemia and  hypercarbia. This was initially felt to be pulmonary edema, however,  his brain natriuretic peptide was totally normal. Ultimately, it was  noted that he has purulent mucus, low-grade fever, and a white blood  cell count of 30,000 most consistent with pneumonia. The patient  currently is on a bilevel device 12/8 with 100% FiO2 and running  saturations of 94%. He has only mild increased work of breathing  surprisingly. He is he is being treated with Rocephin and azithromycin,  with diuresis as well. Pulmonary consulted , pt to be transferred to ICU (3/28)    SUBJECTIVE:  Feeling better, less dyspnea Decreased WOB   Remains on BIPAP overnight   VITAL SIGNS: Temp:  [98.8 F (37.1 C)-102.5 F (39.2 C)] 99.5 F (37.5 C)  (03/29 0833) Pulse Rate:  [72-101] 85 (03/29 0800) Resp:  [17-30] 20 (03/29 0800) BP: (101-148)/(39-82) 113/55 mmHg (03/29 0800) SpO2:  [91 %-100 %] 97 % (03/29 0800) FiO2 (%):  [70 %-100 %] 70 % (03/29 0600) Weight:  [142.7 kg (314 lb 9.5 oz)] 142.7 kg (314 lb 9.5 oz) (03/29 0357)  PHYSICAL EXAMINATION:   GENERAL: He is a morbidly obese male calm on BIPAP  HEENT:  PERRL, dry mucosa   NECK: Large with no obvious palpable thyromegaly or lymphadenopathy.   CHEST: diminished BS in bases , no wheezing  ABDOMEN: Soft, nontender, nondistended with good bowel sounds.  EXTREMITIES: Lower extremities showed tr -1+ edema, pulses were difficult  to palpate but were present. No calf tenderness noted.  NEUROLOGIC: a/o , f/c    Recent Labs Lab 11/04/13 0031 11/04/13 1540 11/05/13 0230  NA 133* 135* 137  K 5.4* 4.1 4.4  CL 96 98 98  CO2 22 22 26   BUN 22 28* 25*  CREATININE 1.28 1.98* 1.74*  GLUCOSE 319* 257* 175*    Recent Labs Lab 11/04/13 0031 11/04/13 1540  HGB 13.0 11.5*  HCT 37.5* 33.5*  WBC 30.3* 19.9*  PLT 318 261   Dg Chest Port 1 View  11/05/2013   CLINICAL DATA:  Followup infiltrates.  EXAM: PORTABLE CHEST - 1 VIEW  COMPARISON:  11/04/2013  FINDINGS: Bilateral airspace opacities markedly improved. Mild interstitial prominence persists, likely mild edema. Cardiomegaly.  No effusions. No acute bony abnormality.  IMPRESSION: Significant improvement in bilateral airspace disease with mild interstitial opacities, likely mild residual edema.   Electronically Signed   By: Charlett Nose M.D.   On: 11/05/2013 07:05   Dg Chest Port 1 View  11/04/2013   CLINICAL DATA:  Shortness of breath  EXAM: PORTABLE CHEST - 1 VIEW  COMPARISON:  None.  FINDINGS: Multifocal patchy opacities in the lungs bilaterally. Differential considerations include multifocal pneumonia or moderate to severe pulmonary edema.  No pneumothorax.  Cardiomegaly.  IMPRESSION: Multifocal patchy opacities bilaterally,  possibly reflecting multifocal pneumonia or moderate to severe pulmonary edema.   Electronically Signed   By: Charline Bills M.D.   On: 11/04/2013 00:52    ASSESSMENT / PLAN:  PULMONARY :  1.  Acute respiratory failure secondary to acute CHF exacerbation (diastolic dysfunction) Also likely community aquired pneumonia, but given the rapid response think the majority of the problem on 3/28 was pulmonary edema (5liters negative today) 3/29 >wbc tr down , decreased wob , good UOP w/ neg bal (-5 L ) , LA tr down   Plan  Continue IV Abx  Follow cx data  Tr LA  2 D echo pending  Continue BD  Change BIPAP to As needed  And At bedtime   Titrate O2 via  for sat >90% . Continue lasix today, goal -1L   2.DM   Plan  SSI   3. HTN   Plan  Cont coreg   4. Hyperlipidemia   Plan  Cont statin   5. Hypothyroid  tsh ok   Plan  Cont synthroid    6. Renal Insufficiency   Plan  Monitor closely    PARRETT,TAMMY NP- C Pulmonary and Critical Care Medicine [facility] Pager: [phone]  11/05/2013, 9:05 AM  Attending:  I have seen and examined the patient with nurse practitioner/resident and agree with the note above.    He has done well.  As noted above, he has CAP (fever, WBC, cough) but I think the majority of the problem on 3/28 was CHF and pulmonary edema.  Needs CHF work up.  OK to transition back to SDU, [facility]  Yolonda Kida PCCM Pager: [phone] Cell: [phone] If no response, call [phone]

## 2013-11-05 NOTE — Progress Notes (Signed)
  Echocardiogram 2D Echocardiogram has been performed.  Georgian CoWILLIAMS, Tyr Franca 11/05/2013, 9:18 AM

## 2013-11-05 NOTE — Progress Notes (Signed)
Pt taken off BIPAP and placed on 4lpm [place]. Pt tolerating well at this time, and vitals are within normal limits at this time. RT will continue to monitor.

## 2013-11-06 ENCOUNTER — Inpatient Hospital Stay (HOSPITAL_COMMUNITY): Payer: Self-pay

## 2013-11-06 ENCOUNTER — Encounter (HOSPITAL_COMMUNITY): Payer: Self-pay | Admitting: *Deleted

## 2013-11-06 LAB — BASIC METABOLIC PANEL
BUN: 18 mg/dL (ref 6–23)
CHLORIDE: 100 meq/L (ref 96–112)
CO2: 25 mEq/L (ref 19–32)
Calcium: 9.3 mg/dL (ref 8.4–10.5)
Creatinine, Ser: 1.29 mg/dL (ref 0.50–1.35)
GFR calc non Af Amer: 67 mL/min — ABNORMAL LOW (ref >90)
GFR, EST AFRICAN AMERICAN: 78 mL/min — AB (ref >90)
Glucose, Bld: 169 mg/dL — ABNORMAL HIGH (ref 70–99)
POTASSIUM: 4.8 meq/L (ref 3.7–5.3)
Sodium: 140 mEq/L (ref 137–147)

## 2013-11-06 LAB — GLUCOSE, CAPILLARY
GLUCOSE-CAPILLARY: 178 mg/dL — AB (ref 70–99)
GLUCOSE-CAPILLARY: 290 mg/dL — AB (ref 70–99)
GLUCOSE-CAPILLARY: 306 mg/dL — AB (ref 70–99)
Glucose-Capillary: 153 mg/dL — ABNORMAL HIGH (ref 70–99)
Glucose-Capillary: 135 mg/dL — ABNORMAL HIGH (ref 70–99)
Glucose-Capillary: 256 mg/dL — ABNORMAL HIGH (ref 70–99)

## 2013-11-06 LAB — CBC
HEMATOCRIT: 33.8 % — AB (ref 39.0–52.0)
HEMOGLOBIN: 11.5 g/dL — AB (ref 13.0–17.0)
MCH: 30.3 pg (ref 26.0–34.0)
MCHC: 34 g/dL (ref 30.0–36.0)
MCV: 89.2 fL (ref 78.0–100.0)
Platelets: 265 10*3/uL (ref 150–400)
RBC: 3.79 MIL/uL — ABNORMAL LOW (ref 4.22–5.81)
RDW: 13.7 % (ref 11.5–15.5)
WBC: 16.4 10*3/uL — ABNORMAL HIGH (ref 4.0–10.5)

## 2013-11-06 MED ORDER — INSULIN ASPART 100 UNIT/ML ~~LOC~~ SOLN
0.0000 [IU] | Freq: Every day | SUBCUTANEOUS | Status: DC
  Administered 2013-11-06 – 2013-11-07 (×2): 3 [IU] via SUBCUTANEOUS

## 2013-11-06 MED ORDER — INSULIN ASPART 100 UNIT/ML ~~LOC~~ SOLN
0.0000 [IU] | Freq: Three times a day (TID) | SUBCUTANEOUS | Status: DC
Start: 2013-11-06 — End: 2013-11-08
  Administered 2013-11-06: 15 [IU] via SUBCUTANEOUS
  Administered 2013-11-06: 8 [IU] via SUBCUTANEOUS
  Administered 2013-11-07 (×2): 11 [IU] via SUBCUTANEOUS
  Administered 2013-11-07: 4 [IU] via SUBCUTANEOUS
  Administered 2013-11-08 (×2): 11 [IU] via SUBCUTANEOUS
  Administered 2013-11-08: 7 [IU] via SUBCUTANEOUS

## 2013-11-06 MED ORDER — ALBUTEROL SULFATE (2.5 MG/3ML) 0.083% IN NEBU
2.5000 mg | INHALATION_SOLUTION | RESPIRATORY_TRACT | Status: DC | PRN
  Administered 2013-11-08: 2.5 mg via RESPIRATORY_TRACT

## 2013-11-06 MED ORDER — PNEUMOCOCCAL VAC POLYVALENT 25 MCG/0.5ML IJ INJ
0.5000 mL | INJECTION | INTRAMUSCULAR | Status: AC
Start: 2013-11-07 — End: 2013-11-07
  Administered 2013-11-07: 0.5 mL via INTRAMUSCULAR
  Filled 2013-11-06: qty 0.5

## 2013-11-06 MED ORDER — INFLUENZA VAC SPLIT QUAD 0.5 ML IM SUSP
0.5000 mL | INTRAMUSCULAR | Status: AC
  Administered 2013-11-07: 0.5 mL via INTRAMUSCULAR
  Filled 2013-11-06: qty 0.5

## 2013-11-06 MED ORDER — FUROSEMIDE 10 MG/ML IJ SOLN
40.0000 mg | Freq: Once | INTRAMUSCULAR | Status: AC
  Administered 2013-11-06: 40 mg via INTRAVENOUS
  Filled 2013-11-06: qty 4

## 2013-11-06 MED ORDER — INSULIN GLARGINE 100 UNIT/ML ~~LOC~~ SOLN: 25.0000 [IU] | Freq: Every day | SUBCUTANEOUS | Status: DC

## 2013-11-06 NOTE — Progress Notes (Signed)
  Inpatient Diabetes Program Recommendations  AACE/ADA: New Consensus Statement on Inpatient Glycemic Control (2013)  Target Ranges:  Prepandial:   less than 140 mg/dL      Peak postprandial:   less than 180 mg/dL (1-2 hours)      Critically ill patients:  140 - 180 mg/dL   Reason for Visit: referral received.  Attempted to speak to patient however he is aphasic and gets frustrated.  Called and spoke to girlfriend Marcelino DusterMichelle.  She states that she administers patients insulin at home.  His blood sugars had improved however she states that they have worsened recently.  She states that he does well with his meals however he tends to snack which she believes contributes to elevated CBG's.  She states that recently she has had to give Novolog 9-12 units with each meal. Results for Patrick Case, Patrick Case (MRN [id number]) as of 11/06/2013 15:21  Ref. Range 11/04/2013 12:20  Hemoglobin A1C Latest Range: <5.7 % 8.8 (H)   Diabetes history: Type 2 diabetes Outpatient Diabetes medications: Lantus 25 units daily, Novolog 150-200 mg/dL=3 units, [phone] units, 251-300-9 units, 301-350 mg/dL-12 untis, [phone] FA/OZ-30mg/dL-15 units Current orders for Inpatient glycemic control: Lantus 25 units q HS, and Novolog resistant tid with meals.  It appears that patient may benefit from the addition of Novolog meal coverage 4 units tid with meals (in addition to correction) to prevent spikes in CBG's from CHO.  Will also provide information regarding "Diabetes Friendly Snacks" and request dietician consult.  Will follow. Beryl MeagerJenny Deaisa Merida, RN, BC-ADM Inpatient Diabetes Coordinator Pager [phone]

## 2013-11-06 NOTE — Progress Notes (Signed)
Patient received to 2W. Denies chest pain or shortness of breath. Request to sit in bed side chair. No changes from previous assessment. VSS. Call bell near.Mamie LeversBaldwin, Koren Plyler E

## 2013-11-06 NOTE — Clinical Documentation Improvement (Signed)
Presents with Respiratory Failure, possible Aspiration Pneumonia or CAP, Morbid Obesity with BMI of 53.   Patient's initial WBC = 30.3  HR ranging from 78 to 99  RR ranging from 19 to 34  Temperatures ranging from 99.9 Axillary to 102.5 Orally  Patient with Pneumonia, Respiratory Failure  Antibiotics initiated  Please clarify if you feel Sepsis: Ruled in, present and evolving on admission              Ruled out    Please document findings in next progress note and discharge summary.     Thank You, Shellee MiloEileen T Sirr Kabel ,RN Clinical Documentation Specialist:  [phone] : Cell: [phone] [place]- Health Information Management

## 2013-11-06 NOTE — Progress Notes (Signed)
Pt states he will try to wear mask tonight and will call when he is ready for bed.

## 2013-11-06 NOTE — Progress Notes (Signed)
   Name: Patrick Case MRN: [id number] DOB: 05/10/1971    ADMISSION DATE:  11/04/2013 CONSULTATION DATE:  3/28  [facility]  PRIMARY SERVICE:  TRH   CHIEF COMPLAINT:  Very short of breath   BRIEF PATIENT DESCRIPTION:  43 yo morbidly obese male hx of CVA and OSA -CPAP noncompliant admitted 3/28 for resp distress requiring BIPAP. PCCM consulted 3/28 moved to ICU.   SIGNIFICANT EVENTS / STUDIES:  Transferred to ICU 3/28 on BIPAP  3/29 Echo: LVEF 65-60%. Grade 1 diastolic dysfunction  LINES / TUBES:   CULTURES: 3/28 BC x 2 >> 3/28 Influenza >>  ANTIBIOTICS: 3/28 Azithromycin>>3/30 3/28 Zosyn >>3/30 Levofloxacin 3/30 >>    SUBJECTIVE:  No distress. No new complaints. Comfortable on [place] O2   VITAL SIGNS: Temp:  [97.9 F (36.6 C)-99.2 F (37.3 C)] 99 F (37.2 C) (03/30 1602) Pulse Rate:  [70-95] 82 (03/30 1200) Resp:  [16-25] 22 (03/30 1200) BP: (70-144)/(42-102) 107/58 mmHg (03/30 1200) SpO2:  [93 %-100 %] 95 % (03/30 1200) FiO2 (%):  [50 %] 50 % (03/30 0134)  PHYSICAL EXAMINATION: GENERAL: NAD  HEENT:  WNL  CHEST: clear throughout ABDOMEN: Soft, nontender, +BS  EXTREMITIES: 3+ symmetric edema NEUROLOGIC: mild exp aphasia, MAEs  I have reviewed all of today's lab results. Relevant abnormalities are discussed in the A/P section   CXR: improving bilateral infiltrates  ASSESSMENT: Acute respiratory failure Diffuse pulmonary infiltrates - improving AKI - Cr improving Severe volume overload Obesity H/O OSA - noncompliant with CPAP H/O CVA with residual mild expressive aphasia DM2 Htn - controlled Hyperlipidemia  Hypothyroidism   Plan  Transfer to Tele Lasix X 1 3/30 Change abx to PO levofloxacin Check BNP and PCT in AM 3/31 Recheck CXR 3/31 Cont nocturnal BiPAP as ordered   Billy Fischeravid Manjinder Breau, MD ; [facility] service Mobile [phone].  After 5:30 PM or weekends, call [phone]

## 2013-11-06 NOTE — Progress Notes (Signed)
UR Completed.  [phone] 11/06/2013 Vangie BickerBrown, Darin Arndt Jane

## 2013-11-07 ENCOUNTER — Inpatient Hospital Stay (HOSPITAL_COMMUNITY): Payer: MEDICAID

## 2013-11-07 LAB — CBC
HEMATOCRIT: 34 % — AB (ref 39.0–52.0)
HEMOGLOBIN: 11.4 g/dL — AB (ref 13.0–17.0)
MCH: 30 pg (ref 26.0–34.0)
MCHC: 33.5 g/dL (ref 30.0–36.0)
MCV: 89.5 fL (ref 78.0–100.0)
PLATELETS: 324 10*3/uL (ref 150–400)
RBC: 3.8 MIL/uL — AB (ref 4.22–5.81)
RDW: 13.4 % (ref 11.5–15.5)
WBC: 15 10*3/uL — AB (ref 4.0–10.5)

## 2013-11-07 LAB — GLUCOSE, CAPILLARY
GLUCOSE-CAPILLARY: 180 mg/dL — AB (ref 70–99)
GLUCOSE-CAPILLARY: 260 mg/dL — AB (ref 70–99)
GLUCOSE-CAPILLARY: 290 mg/dL — AB (ref 70–99)
Glucose-Capillary: 268 mg/dL — ABNORMAL HIGH (ref 70–99)

## 2013-11-07 LAB — BASIC METABOLIC PANEL
BUN: 18 mg/dL (ref 6–23)
CHLORIDE: 98 meq/L (ref 96–112)
CO2: 28 mEq/L (ref 19–32)
Calcium: 9.3 mg/dL (ref 8.4–10.5)
Creatinine, Ser: 1.3 mg/dL (ref 0.50–1.35)
GFR calc Af Amer: 77 mL/min — ABNORMAL LOW (ref >90)
GFR calc non Af Amer: 66 mL/min — ABNORMAL LOW (ref >90)
Glucose, Bld: 166 mg/dL — ABNORMAL HIGH (ref 70–99)
POTASSIUM: 4.4 meq/L (ref 3.7–5.3)
SODIUM: 139 meq/L (ref 137–147)

## 2013-11-07 LAB — PROCALCITONIN: Procalcitonin: 0.75 ng/mL

## 2013-11-07 LAB — PRO B NATRIURETIC PEPTIDE: Pro B Natriuretic peptide (BNP): 36.7 pg/mL (ref 0–125)

## 2013-11-07 MED ORDER — ALBUTEROL SULFATE (2.5 MG/3ML) 0.083% IN NEBU
2.5000 mg | INHALATION_SOLUTION | Freq: Three times a day (TID) | RESPIRATORY_TRACT | Status: DC
  Administered 2013-11-07 – 2013-11-08 (×4): 2.5 mg via RESPIRATORY_TRACT
  Filled 2013-11-07 (×5): qty 3

## 2013-11-07 MED ORDER — LEVOFLOXACIN 500 MG PO TABS
500.0000 mg | ORAL_TABLET | Freq: Every day | ORAL | Status: DC
  Administered 2013-11-07 – 2013-11-08 (×2): 500 mg via ORAL
  Filled 2013-11-07 (×2): qty 1

## 2013-11-07 NOTE — Progress Notes (Signed)
Transferred to 2w16. O2 sat 79% on Room air; patient denies shortness of breath. Returned to 4L nasal cannula. Patient to bed side chair. Call bell near. Will continue to monitor.Mamie LeversBaldwin, Christophe Rising E

## 2013-11-07 NOTE — Plan of Care (Signed)
Problem: Food- and Nutrition-Related Knowledge Deficit (NB-1.1) Goal: Nutrition education Formal process to instruct or train a patient/client in a skill or to impart knowledge to help patients/clients voluntarily manage or modify food choices and eating behavior to maintain or improve health. Outcome: Completed/Met Date Met:  11/07/13  RD consulted for nutrition education regarding diabetes.     Lab Results  Component Value Date    HGBA1C 8.8* 11/04/2013    RD provided "Carbohydrate Counting for People with Diabetes" handout from the Academy of Nutrition and Dietetics. Discussed different food groups and their effects on blood sugar, emphasizing carbohydrate-containing foods. Provided list of carbohydrates and recommended serving sizes of common foods.  Discussed importance of controlled and consistent carbohydrate intake throughout the day. Provided examples of ways to balance meals/snacks and encouraged intake of high-fiber, whole grain complex carbohydrates. Teach back method used.  Expect fair compliance.  Body mass index is 50.77 kg/(m^2). Pt meets criteria for Obesity Class III based on current BMI.  Current diet order is Carbohydrate Modified, patient is consuming approximately 100% of meals at this time. Labs and medications reviewed. No further nutrition interventions warranted at this time. If additional nutrition issues arise, please re-consult RD.  Arthur Holms, RD, LDN Pager #: [phone] After-Hours Pager #: [phone]

## 2013-11-07 NOTE — Discharge Summary (Signed)
Physician Discharge Summary  Patient ID: Patrick Case MRN: [id number] DOB/AGE: 05-09-1971 43 y.o.  Admit date: 11/04/2013 Discharge date: 11/08/2013    Discharge Diagnoses:  Acute Respiratory Failure - resolving. Pulmonary Edema - resolved. CAP - resolving. Hx of OSA Hyperlipidemia HTN DM Hypothyroidism                                                                       DISCHARGE PLAN BY DIAGNOSIS     Acute Respiratory Failure - resolving. Pulmonary Edema - resolved. CAP - resolving. Hx of OSA Discharge Plan: - Continue Levofloxacin (stop date 11/13/13) - Continue Oxygen as needed, Case management has contacted [employer] who will assist in setting up home O2. - Will need to schedule appt with PCP to arrange for sleep study and CPAP setup.  Hyperlipidemia HTN Discharge Plan: - Continue home meds. - Follow up with PCP.  DM Discharge Plan: - Continue home meds. - Follow up with PCP.  Hypothyroidism Discharge Plan: - Continue home meds. - Follow up with PCP.                DISCHARGE SUMMARY   Patrick Case is a 43 y.o. y/o male with a PMH of morbid obesity, HTN, DM2, OSA, CVA, CHF, presented to [facility] ED on 11/04/13 with worsening SOB, cough, orthopnea, and edema x 3 days.  In ED, pt was found to be in respiratory acidosis with hypoxia and hypercarbia.  CXR suggested pulmonary edema although BNP was totally normal.  Pt was placed on BiPAP with good response.  He was started on Rocephin/Azithromycin to cover CAP due to productive cough, low grade fever, and leukocytosis.  During hospitalization, he was given lasix which caused him to be - 8.1 L since admission. An echo was obtained which showed grade 1 diastolic dysfunction. Antibiotics were changed to Levofloxacin PO on 3/31, he will complete a 7 day course (stop date of 11/13/13). He is still requiring 4L of oxygen but overall, has improved.  Hypoxemia is presumed secondary to CAP  which is resolving and likely obesity  hypoventilation syndrome.  On 4/1, he was deemed stable for discharge home.  He will be discharged with home O2 and will require follow up with his PCP so that he can be referred to a pulmonologist for a sleep study and CPAP setup.             SIGNIFICANT DIAGNOSTIC STUDIES / EVENTS 3/28: Transferred to ICU on BIPAP. 3/29 Echo: LVEF 65-60%. Grade 1 diastolic dysfunction. 3/30: Out of ICU to tele.  MICRO DATA  3/28 BC x 2 >>   ANTIBIOTICS Azithromycin 3/28 >>> 3/30  Zosyn 3/28 >>> 3/30  Levofloxacin 3/31 >>> (stop date 4/6)  CONSULTS None  TUBES / LINES PIV  Discharge Exam: General: Pleasant male, obese, resting in recliner, in NAD. Neuro: A&O x 3, non-focal.  HEENT: Patrick Case/AT. PERRL, sclerae anicteric. Cardiovascular: RRR, no M/R/G.  Lungs: Respirations even and unlabored.  CTA bilaterally, No W/R/R.  Abdomen: BS x 4, soft, NT/ND.  Musculoskeletal: No gross deformities, no edema.  Skin: Intact, warm, no rashes.    Filed Vitals:   11/07/13 1701 11/07/13 2142 11/08/13 0453 11/08/13 0804  BP: 155/75 146/80 135/71   Pulse: 90 79 71  Temp:  98.2 F (36.8 C) 97.9 F (36.6 C)   TempSrc:  Oral Oral   Resp:  19 19   Height:      Weight:   303 lb 12.7 oz (137.8 kg)   SpO2:  98% 98% 97%     Discharge Labs  BMET  Recent Labs Lab 11/04/13 0031 11/04/13 1540 11/05/13 0230 11/06/13 0915 11/07/13 0505  NA 133* 135* 137 140 139  K 5.4* 4.1 4.4 4.8 4.4  CL 96 98 98 100 98  CO2 '22 22 26 25 28  ' GLUCOSE 319* 257* 175* 169* 166*  BUN 22 28* 25* 18 18  CREATININE 1.28 1.98* 1.74* 1.29 1.30  CALCIUM 9.0 8.6 9.1 9.3 9.3    CBC  Recent Labs Lab 11/04/13 1540 11/06/13 0915 11/07/13 0505  HGB 11.5* 11.5* 11.4*  HCT 33.5* 33.8* 34.0*  WBC 19.9* 16.4* 15.0*  PLT 261 265 324    Anti-Coagulation No results found for this basename: INR,  in the last 168 hours      Discharge Orders   Future Orders Complete By Expires   Call MD for:  difficulty breathing,  headache or visual disturbances  As directed    Call MD for:  extreme fatigue  As directed    Call MD for:  persistant dizziness or light-headedness  As directed    Call MD for:  temperature >100.4  As directed    Diet - low sodium heart healthy  As directed    Increase activity slowly  As directed             Medication List         amLODipine 10 MG tablet  Commonly known as:  NORVASC  Take 10 mg by mouth daily.     aspirin EC 81 MG tablet  Take 81 mg by mouth daily.     atorvastatin 80 MG tablet  Commonly known as:  LIPITOR  Take 80 mg by mouth daily.     buPROPion 75 MG tablet  Commonly known as:  WELLBUTRIN  Take 75 mg by mouth 2 (two) times daily.     carvedilol 25 MG tablet  Commonly known as:  COREG  Take 25 mg by mouth 2 (two) times daily with a meal.     gabapentin 100 MG capsule  Commonly known as:  NEURONTIN  Take 100 mg by mouth 2 (two) times daily.     insulin glargine 100 UNIT/ML injection  Commonly known as:  LANTUS  Inject 25 Units into the skin at bedtime.     insulin lispro 100 UNIT/ML injection  Commonly known as:  HUMALOG  Inject 3-15 Units into the skin 3 (three) times daily before meals. Sliding scale     levofloxacin 500 MG tablet  Commonly known as:  LEVAQUIN  Take 1 tablet (500 mg total) by mouth daily.     levothyroxine 200 MCG tablet  Commonly known as:  SYNTHROID, LEVOTHROID  Take 200 mcg by mouth daily before breakfast.     lisinopril 40 MG tablet  Commonly known as:  PRINIVIL,ZESTRIL  Take 40 mg by mouth daily.     minoxidil 10 MG tablet  Commonly known as:  LONITEN  Take 20 mg by mouth daily.     traMADol 50 MG tablet  Commonly known as:  ULTRAM  Take 50 mg by mouth every 6 (six) hours as needed for moderate pain.     Vitamin D (Ergocalciferol) 50000 UNITS Caps capsule  Commonly known as:  DRISDOL  Take 50,000 Units by mouth every Monday, Wednesday, and Friday.          Disposition: Home.  Discharged  Condition: Patrick Case has met maximum benefit of inpatient care and is medically stable and cleared for discharge.  Patient is pending follow up as above.      Follow up: PCP for sleep study referral and monitoring of O2 saturation  Time spent on disposition:  Greater than 45 minutes.   Montey Hora, [place] [facility] Pgr: [phone]  or [phone]   Attending:  I have seen and examined the patient with nurse practitioner/resident and agree with the note above.

## 2013-11-07 NOTE — Care Management Note (Signed)
    Page 1 of 2   11/08/2013     1:56:49 PM   CARE MANAGEMENT NOTE 11/08/2013  Patient:  Patrick Case,Patrick Case   Account Number:  [id number]  Date Initiated:  11/06/2013  Documentation initiated by:  [facility]ARAH  Subjective/Objective Assessment:   ADmitted with resp failure.     Action/Plan:   Anticipated DC Date:  11/08/2013   Anticipated DC Plan:  HOME W HOME HEALTH SERVICES      DC Planning Services  CM consult      Choice offered to / List presented to:     DME arranged  [facility]      [facility]        Status of service:  Completed, signed off Medicare Important Message given?   (If response is "NO", the following Medicare IM given date fields will be blank) Date Medicare IM given:   Date Additional Medicare IM given:    Discharge Disposition:  HOME/SELF CARE  Per UR Regulation:  Reviewed for med. necessity/level of care/duration of stay  If discussed at Long Length of Stay Meetings, dates discussed:    Comments:  Contact:  Portoukalian,Michelle Other [phone]  11/08/13 Suki Crockett,RN,BSN [phone] [facility] IS ABLE TO PROVIDE HOME OXYGEN AND PORTABLE TANKS FOR PT.  PLAN IS TO DELIVER PORTABLE TANKS AND CONCENTRATOR TO WIFE AT [facility] WHERE THEY ARE STAYING; WIFE WILL BRING PORTABLE O2 TANK TO HOSPITAL FOR TRANSPORT HOME ONCE DELIVERED.  11/08/13 Doshie Maggi,RN,BSN [phone] NOTIFIED BY [facility] THAT THEY WILL NOT BE ABLE TO PICK UP PT FOR HOME O2, DUE TO LACK OF INSURANCE.  CALLED [facility] [place]; SPOKE WITH ATNITA:  SHE REFERRED ME TO [facility] [place], THAT CAN SERVICE [place] (PHONE [phone]); SPOKE WITH HEATHER-FAXED REFERRAL TO [phone] FOR REVIEW.  11/07/13 Jamesyn Lindell,RN,BSN [phone] [facility] [place]; WILL NEED HOME OXY[place].  [facility] IN [place] AND CAN SERVICE THIS PT; FAXED REFERRAL TO Chalmers CaterJAMES BRYANT WITH [facility], FAX# [phone].  PT WILL LIKELY NEED SEVERAL PORTABLE TANKS [place];  [place] [facility] TO ARRANGE FOR HOME SET UP ONCE PT BACK IN HIS AREA.  PT/W[place] STATE [place] UP[place].  PLAN TO SEE PCP TO DISCUSS SLEEP STUDY AND IMMINENT CPAP SET UP ASAP.  11-06-13 2:30pm Avie ArenasSarah Brown, RNBSN [phone]- [phone] [place] daughter who is in college.  h/o CVA - CXR shows Pulmonary edema.

## 2013-11-07 NOTE — Progress Notes (Addendum)
   Name: Patrick Case MRN: [id number] DOB: 11/18/1970    ADMISSION DATE:  11/04/2013 CONSULTATION DATE:  3/28  [facility]  PRIMARY SERVICE:  TRH   CHIEF COMPLAINT:  Very short of breath   BRIEF PATIENT DESCRIPTION:  43 yo morbidly obese male hx of CVA and OSA - CPAP noncompliant admitted 3/28 for resp distress requiring BIPAP. PCCM consulted 3/28 moved to ICU.   SIGNIFICANT EVENTS / STUDIES:  3/28: Transferred to ICU on BIPAP  3/29 Echo: LVEF 65-60%. Grade 1 diastolic dysfunction 3/30: Out of ICU to tele.  LINES / TUBES:  CULTURES: 3/28 BC x 2 >> 3/28 Influenza >>  ANTIBIOTICS: Azithromycin 3/28 >>> 3/30 Zosyn 3/28 >>> 3/30 Levofloxacin 3/31  >>> (stop date 4/6)   SUBJECTIVE:  No acute events, rested well overnight.  Denies chest pain, SOB, fevers/chills/sweats.  VITAL SIGNS: Temp:  [97.9 F (36.6 C)-99 F (37.2 C)] 98 F (36.7 C) (03/31 0611) Pulse Rate:  [76-84] 84 (03/31 0751) Resp:  [19-22] 19 (03/31 0611) BP: (107-158)/(54-87) 158/87 mmHg (03/31 0751) SpO2:  [95 %-99 %] 95 % (03/31 0909) Weight:  [305 lb 1.9 oz (138.4 kg)] 305 lb 1.9 oz (138.4 kg) (03/31 0611)  PHYSICAL EXAMINATION: GENERAL: Sitting in recliner, in NAD. HEENT:  WNL  CV:  RRR, no M/R/G appreciated. RESP:  CTAB, no W/R/R. ABDOMEN: Soft, nontender, +BS  EXTREMITIES: 2+ non-pitting edema. NEUROLOGIC: mild exp aphasia, MAEs  I have reviewed all of today's lab results. Relevant abnormalities are discussed in the A/P section  CXR: pulmonary edema resolved.  ASSESSMENT: Acute respiratory failure Diffuse pulmonary infiltrates vs pulmonary edema - improved Leukocytosis AKI - Cr improving Acute on chronic diastolic heart failure> improving Obesity H/O OSA - noncompliant with CPAP H/O CVA with residual mild expressive aphasia DM2 HTN - controlled Hyperlipidemia  Hypothyroidism  Plan: Change abx to PO levofloxacin, 500mg  QD x 7 days. Monitor WBC's/fever curve. Cont nocturnal  BiPAP as ordered. Possible d/c in AM, 11/08/13.   Rutherford Guysahul Desai, PA - [facility] [facility] Pgr: [phone]  or [phone]   Attending:  I have seen and examined the patient with nurse practitioner/resident and agree with the note above.   Still requiring a lot of oxygen, but clinically doing very well; presumably hypoxemia is due to CAP (resolving) and perhaps some residual pulmonary edema (though he looks dry today).  Will plan to DC on CPAP and will need to follow up with us in the pulmonary clinic.  He will need a BIPAP titration study.  D/C in AM  Yolonda KidaMCQUAID, [facility] [place] P[facility]M Pager: [phone] Cell: [phone] If no response, call [phone]

## 2013-11-08 DIAGNOSIS — I6990 Unspecified sequelae of unspecified cerebrovascular disease: Secondary | ICD-10-CM

## 2013-11-08 LAB — GLUCOSE, CAPILLARY
GLUCOSE-CAPILLARY: 269 mg/dL — AB (ref 70–99)
Glucose-Capillary: 215 mg/dL — ABNORMAL HIGH (ref 70–99)
Glucose-Capillary: 263 mg/dL — ABNORMAL HIGH (ref 70–99)

## 2013-11-08 MED ORDER — LEVOFLOXACIN 500 MG PO TABS: 500.0000 mg | ORAL_TABLET | Freq: Every day | ORAL | Status: AC

## 2013-11-08 NOTE — Progress Notes (Signed)
Discharged to home with family office visits in place teaching done , [place]

## 2013-11-10 LAB — CULTURE, BLOOD (ROUTINE X 2)
CULTURE: NO GROWTH
Culture: NO GROWTH

## 2014-10-26 IMAGING — CR DG CHEST 1V PORT
1 series · 1 of 1 positions shown · non-contrast
Comparison: 11/06/2013

CLINICAL DATA: Respiratory failure

EXAM:
PORTABLE CHEST - 1 VIEW

[AP]
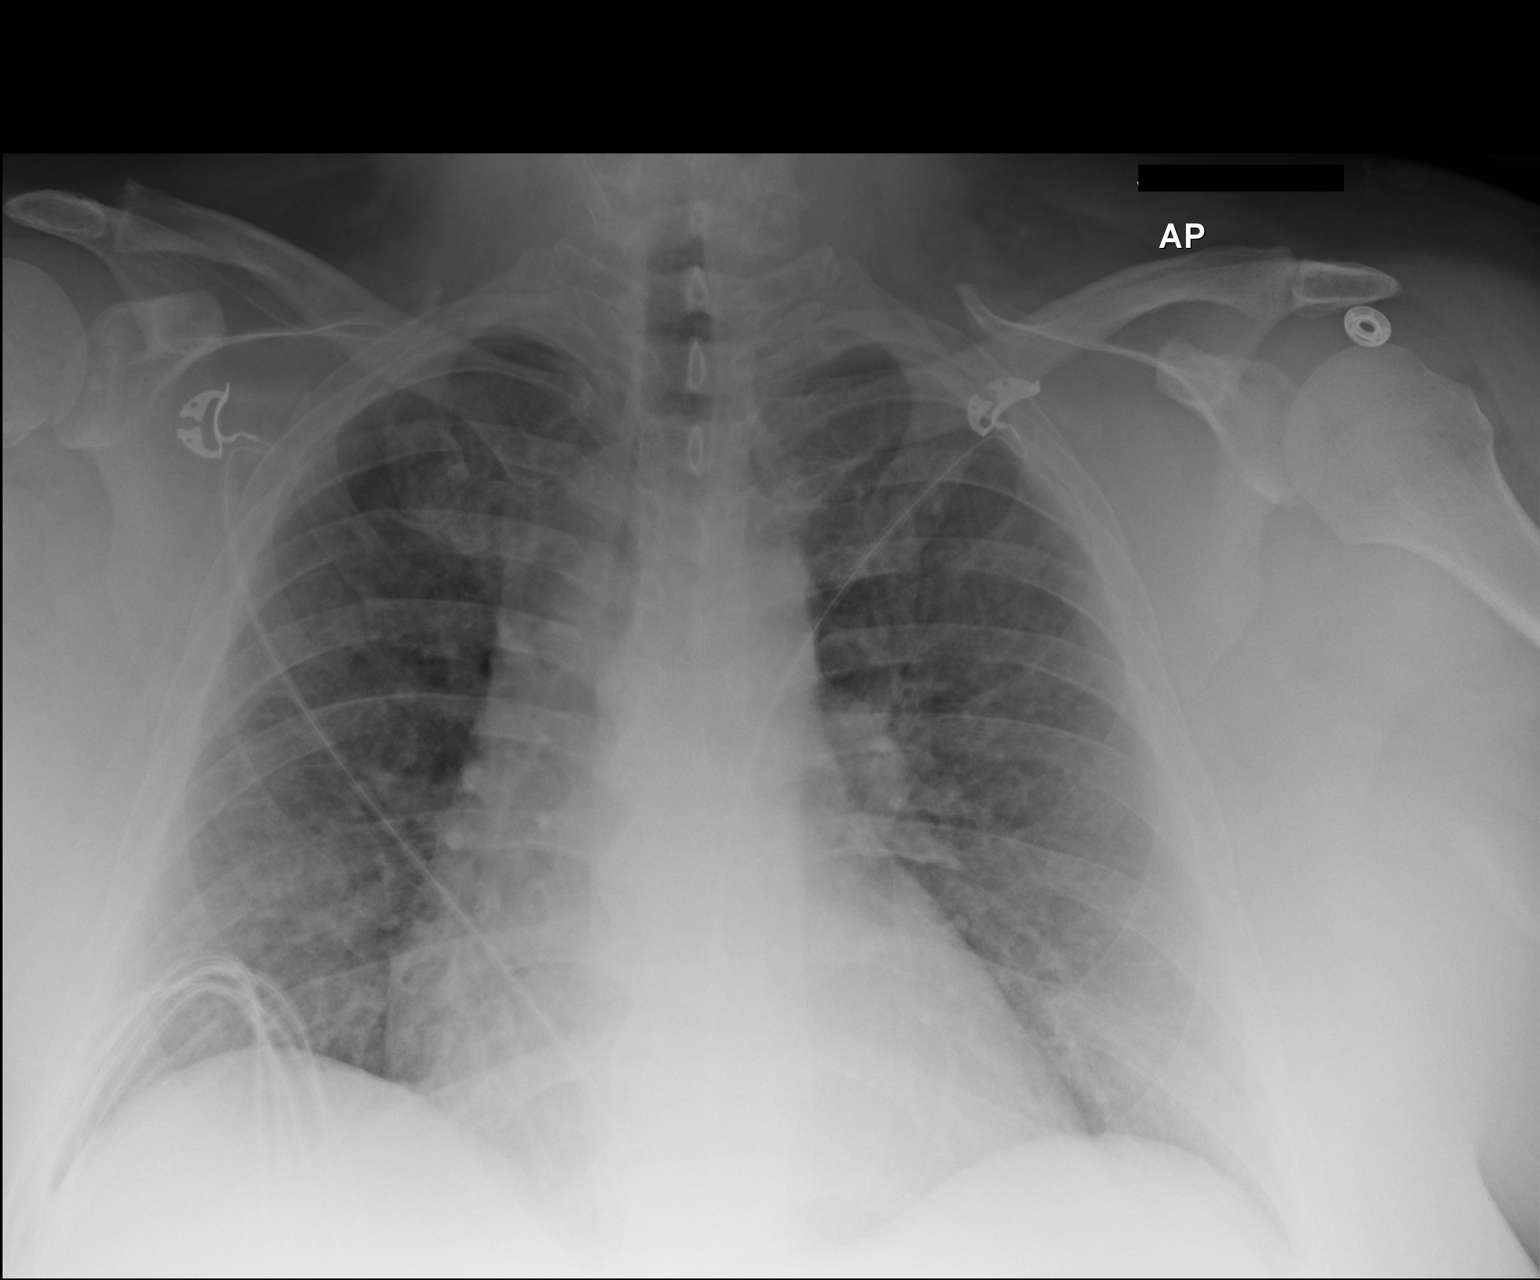

[1 of 1 positions shown; findings below may reference images not displayed]

FINDINGS: Near complete resolution of pulmonary edema. Negative for pneumonia
or effusion. Heart size is mildly enlarged.
IMPRESSION: Interval resolution of pulmonary edema.  Lungs are now clear.

## 2020-08-29 LAB — PSA PROSTATIC SPECIFIC ANTIGEN: PSA: 0.8 ng/mL (ref 0.0–4.0)

## 2020-08-29 LAB — PSA, DIAGNOSTIC (PROSTATE SPECIFIC AG): Prostate Specific Ag: 0.8 ng/mL (ref 0.0–4.0)

## 2021-02-24 ENCOUNTER — Inpatient Hospital Stay
Admit: 2021-02-24 | Discharge: 2021-03-05 | Disposition: A | Discharge status: Skilled Nursing Facility | Payer: MEDICARE | Attending: Internal Medicine | Admitting: Internal Medicine

## 2021-02-24 DIAGNOSIS — I610 Nontraumatic intracerebral hemorrhage in hemisphere, subcortical: Principal | ICD-10-CM

## 2021-02-24 LAB — COMPREHENSIVE METABOLIC PANEL
ALT: 25 U/L (ref 10–49)
AST: 18 U/L (ref 0.0–33.9)
Albumin: 3.1 gm/dl — ABNORMAL LOW (ref 3.4–5.0)
Alkaline Phosphatase: 109 U/L (ref 46–116)
Anion Gap: 8 mmol/L (ref 5–15)
BUN: 41 mg/dl — ABNORMAL HIGH (ref 9–23)
CO2: 23 mEq/L (ref 20–31)
Calcium: 8.8 mg/dl (ref 8.7–10.4)
Chloride: 106 mEq/L (ref 98–107)
Creatinine: 3.51 mg/dl — ABNORMAL HIGH (ref 0.70–1.30)
EGFR IF NonAfrican American: 20
GFR African American: 24
Glucose: 120 mg/dl — ABNORMAL HIGH (ref 74–106)
Potassium: 4.9 mEq/L — ABNORMAL HIGH (ref 3.4–4.5)
Sodium: 137 mEq/L (ref 136–145)
Total Bilirubin: 0.5 mg/dl (ref 0.30–1.20)
Total Protein: 6.4 gm/dl (ref 5.7–8.2)

## 2021-02-24 LAB — CBC WITH AUTO DIFFERENTIAL
Basophils %: 0.6 % (ref 0–3)
Eosinophils %: 1.9 % (ref 0–5)
Hematocrit: 37.2 % (ref 37.0–50.0)
Hemoglobin: 12.8 gm/dl (ref 12.4–17.2)
Immature Granulocytes: 0.3 % (ref 0.0–3.0)
Lymphocytes %: 17.9 % — ABNORMAL LOW (ref 28–48)
MCH: 32.2 pg (ref 23.0–34.6)
MCHC: 34.4 gm/dl (ref 30.0–36.0)
MCV: 93.7 fL (ref 80.0–98.0)
MPV: 8.9 fL (ref 6.0–10.0)
Monocytes %: 4.8 % (ref 1–13)
Neutrophils %: 74.5 % — ABNORMAL HIGH (ref 34–64)
Nucleated RBCs: 0 (ref 0–0)
Platelets: 383 10*3/uL (ref 140–450)
RBC: 3.97 M/uL (ref 3.80–5.70)
RDW-SD: 43.5 (ref 35.1–43.9)
WBC: 13.8 10*3/uL — ABNORMAL HIGH (ref 4.0–11.0)

## 2021-02-24 LAB — PROTIME-INR
INR: 1 (ref 0.1–1.1)
Protime: 12.1 seconds (ref 10.2–12.9)

## 2021-02-24 LAB — POCT GLUCOSE: POC Glucose: 116 mg/dL — ABNORMAL HIGH (ref 65–105)

## 2021-02-24 LAB — CBC WITH AUTOMATED DIFF
BASOPHILS: 0.6 % (ref 0–3)
EOSINOPHILS: 1.9 % (ref 0–5)
HCT: 37.2 % (ref 37.0–50.0)
HGB: 12.8 gm/dl (ref 12.4–17.2)
IMMATURE GRANULOCYTES: 0.3 % (ref 0.0–3.0)
LYMPHOCYTES: 17.9 % — ABNORMAL LOW (ref 28–48)
MCH: 32.2 pg (ref 23.0–34.6)
MCHC: 34.4 gm/dl (ref 30.0–36.0)
MCV: 93.7 fL (ref 80.0–98.0)
MONOCYTES: 4.8 % (ref 1–13)
MPV: 8.9 fL (ref 6.0–10.0)
NEUTROPHILS: 74.5 % — ABNORMAL HIGH (ref 34–64)
NRBC: 0 (ref 0–0)
PLATELET: 383 10*3/uL (ref 140–450)
RBC: 3.97 M/uL (ref 3.80–5.70)
RDW-SD: 43.5 (ref 35.1–43.9)
WBC: 13.8 10*3/uL — ABNORMAL HIGH (ref 4.0–11.0)

## 2021-02-24 LAB — METABOLIC PANEL, COMPREHENSIVE
ALT (SGPT): 25 U/L (ref 10–49)
AST (SGOT): 18 U/L (ref 0.0–33.9)
Albumin: 3.1 gm/dl — ABNORMAL LOW (ref 3.4–5.0)
Alk. phosphatase: 109 U/L (ref 46–116)
Anion gap: 8 mmol/L (ref 5–15)
BUN: 41 mg/dl — ABNORMAL HIGH (ref 9–23)
Bilirubin, total: 0.5 mg/dl (ref 0.30–1.20)
CO2: 23 mEq/L (ref 20–31)
Calcium: 8.8 mg/dl (ref 8.7–10.4)
Chloride: 106 mEq/L (ref 98–107)
Creatinine: 3.51 mg/dl — ABNORMAL HIGH (ref 0.70–1.30)
GFR est AA: 24
GFR est non-AA: 20
Glucose: 120 mg/dl — ABNORMAL HIGH (ref 74–106)
Potassium: 4.9 mEq/L — ABNORMAL HIGH (ref 3.4–4.5)
Protein, total: 6.4 gm/dl (ref 5.7–8.2)
Sodium: 137 mEq/L (ref 136–145)

## 2021-02-24 LAB — PROTHROMBIN TIME + INR
INR: 1 (ref 0.1–1.1)
Prothrombin time: 12.1 seconds (ref 10.2–12.9)

## 2021-02-24 LAB — GLUCOSE, POC: Glucose (POC): 116 mg/dL — ABNORMAL HIGH (ref 65–105)

## 2021-02-24 MED ORDER — SODIUM CHLORIDE 0.9 % INJECTION
40 mg | Freq: Every day | INTRAMUSCULAR | Status: DC
Start: 2021-02-24 — End: 2021-02-25
  Administered 2021-02-25: 13:00:00 via INTRAVENOUS

## 2021-02-24 MED ORDER — GLUCAGON 1 MG INJECTION
1 mg | INTRAMUSCULAR | Status: DC | PRN
Start: 2021-02-24 — End: 2021-02-25

## 2021-02-24 MED ORDER — SODIUM CHLORIDE 0.9 % IV
5005 mg/5 mL | Freq: Two times a day (BID) | INTRAVENOUS | Status: DC
Start: 2021-02-24 — End: 2021-02-24

## 2021-02-24 MED ORDER — DEXTROSE 50% IN WATER (D50W) IV SYRG
INTRAVENOUS | Status: DC | PRN
Start: 2021-02-24 — End: 2021-02-25

## 2021-02-24 MED ORDER — SODIUM CHLORIDE 0.9 % IV
2510 mg/10 mL | INTRAVENOUS | Status: DC
Start: 2021-02-24 — End: 2021-02-24
  Administered 2021-02-24: 22:00:00 via INTRAVENOUS

## 2021-02-24 MED ORDER — ONDANSETRON (PF) 4 MG/2 ML INJECTION
4 mg/2 mL | INTRAMUSCULAR | Status: DC | PRN
Start: 2021-02-24 — End: 2021-03-05

## 2021-02-24 MED ORDER — NICARDIPINE IN SODIUM CHLORIDE(ISO-OSMOTIC) 40 MG/200 ML IV PIGGY BACK
40 mg/200 mL (0.2 mg/mL) | INTRAVENOUS | Status: DC
Start: 2021-02-24 — End: 2021-02-26
  Administered 2021-02-25 (×6): via INTRAVENOUS

## 2021-02-24 MED ORDER — INSULIN LISPRO 100 UNIT/ML INJECTION
100 unit/mL | Freq: Four times a day (QID) | SUBCUTANEOUS | Status: DC
Start: 2021-02-24 — End: 2021-02-25

## 2021-02-24 MED ORDER — ROSUVASTATIN 10 MG TAB
10 mg | Freq: Every evening | ORAL | Status: DC
Start: 2021-02-24 — End: 2021-03-05
  Administered 2021-02-26 – 2021-03-05 (×8): via ORAL

## 2021-02-24 MED ORDER — NICARDIPINE IN SODIUM CHLORIDE(ISO-OSMOTIC) 20 MG/200 ML IV PIGGY BACK
INTRAVENOUS | Status: DC
Start: 2021-02-24 — End: 2021-02-24

## 2021-02-24 MED ORDER — SODIUM CHLORIDE 0.9 % IV
INTRAVENOUS | Status: DC
Start: 2021-02-24 — End: 2021-02-26
  Administered 2021-02-24 – 2021-02-25 (×2): via INTRAVENOUS

## 2021-02-24 MED ORDER — LEVETIRACETAM IN SOD CHLORIDE (ISO-OSM) 500 MG/100 ML IV PIGGY BACK
500 mg/100 mL | Freq: Two times a day (BID) | INTRAVENOUS | Status: DC
Start: 2021-02-24 — End: 2021-02-25
  Administered 2021-02-24 – 2021-02-25 (×3): via INTRAVENOUS

## 2021-02-24 MED ORDER — NALOXONE 0.4 MG/ML INJECTION
0.4 mg/mL | INTRAMUSCULAR | Status: DC | PRN
Start: 2021-02-24 — End: 2021-03-05

## 2021-02-24 MED ORDER — INSULIN GLARGINE 100 UNIT/ML INJECTION
100 unit/mL | Freq: Every evening | SUBCUTANEOUS | Status: DC
Start: 2021-02-24 — End: 2021-02-25

## 2021-02-24 MED FILL — LEVETIRACETAM IN SOD CHLORIDE (ISO-OSM) 500 MG/100 ML IV PIGGY BACK: 500 mg/100 mL | INTRAVENOUS | Qty: 100

## 2021-02-24 MED FILL — NICARDIPINE 2.5 MG/ML IV: 25 mg/10 mL | INTRAVENOUS | Qty: 20

## 2021-02-24 NOTE — H&P (Signed)
History and Physical        Patient: Thomas Jackson               Sex: male          DOA: 02/24/2021         Date of Birth:  September 29, 1970      Age:  50 y.o.        LOS:  LOS: 0 days        HPI:     Thomas Jackson is a 50 y.o. male who was transferred from [facility] for evaluation of intracerebral bleed.  I was not able to obtain much history from the patient most of the history was obtained with the help of patient's significant other.  According to patient's girlfriend they have been together for several years  Last year patient had a seizure and then stroke.  3 days ago patient started getting confused disoriented started having urine and fecal incontinence.  She took him to [facility] where he had CT done which showed right thalamic hemorrhage.    No past medical history on file.  Hypertension  DM type II  History of stroke  CKD  Seizure disorder  No past surgical history on file.  Thyroidectomy  No family history on file.  Both parents were diabetic Sister had CKD  Social History     Socioeconomic History   ??? Marital status: SINGLE       Prior to Admission medications    Not on File   Medication list was reviewed    Allergies   Allergen Reactions   ??? Amoxicillin Other (comments)   ??? Penicillin G Unknown (comments)       Subjective:   Increased confusion    Review of Systems:   Unable to obtain  Objective:     Visit Vitals  BP (!) 158/74   Pulse 75   Temp 98.4 ??F (36.9 ??C)   Resp 18   SpO2 96%       Physical Exam:     General appearance: Middle-age male well-built very confused  Head: Normocephalic, without obvious abnormality, atraumatic  Eyes: Conjunctiva pink, PERLA, EOMI  Neck: supple, no neck stiffness, No JVD  Lungs: CTA bilaterally,  Heart: Regular rhythm, no murmur  Abdomen: soft, distended non-tender, bowel sounds present,   Extremities: No edema, distal pulses palpable  Skin: Warm and dry  Psych: Appropriate Mood and Affect  Neurologic: Awake oriented to person only able to move all 4  extremities, gait not checked    Intake and Output:  Current Shift:  No intake/output data recorded.  Last three shifts:  No intake/output data recorded.    Lab/Data Reviewed:  No results found for this or any previous visit (from the past 48 hour(s)).    Imaging:  No results found.    Medications Reviewed:  Current Facility-Administered Medications   Medication Dose Route Frequency   ??? niCARdipine (CARDENE) 50 mg in 0.9% sodium chloride 250 mL infusion  0-15 mg/hr IntraVENous TITRATE   ??? dextrose (D50W) injection syrg 5-25 g  10-50 mL IntraVENous PRN   ??? glucagon (GLUCAGEN) injection 1 mg  1 mg IntraMUSCular PRN   ??? insulin glargine (LANTUS) injection 1-100 Units  1-100 Units SubCUTAneous QHS   ??? insulin lispro (HUMALOG) injection 1-100 Units  1-100 Units SubCUTAneous Q6H   ??? rosuvastatin (CRESTOR) tablet 10 mg  10 mg Oral QHS   ??? levETIRAcetam (KEPPRA) 500 mg in saline (iso-osm)  100 mL IVPB (premix)  500 mg IntraVENous Q12H     No current outpatient medications on file.       Assessment:   1.  Acute metabolic encephalopathy multifactorial  2.  Acute right thalamic hemorrhage  3.  Hypertension  4.  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  CKD stage IV  6.  Vitamin D deficiency  7.  Seizure disorder  8.  Hyperlipidemia  Plan:   1.  Patient being admitted to ICU for close monitoring.  For better hypertension control patient was started on Cardene drip which will be titrated to keep systolic blood pressure less than 140  2.  For seizure disorder patient was started on Keppra 500 mg IV twice daily  3.  Patient be kept n.p.o. until he is more awake and alert  Meanwhile patient be started on IV fluids  4.  For glycemic control patient be started on low-dose Glucomander protocol  5.  Neurosurgery consult was done.  We will repeat CT of the head in a.m. patient's previous medical record was reviewed in detail  6.  Patient's condition management plan discussed with patient and girlfriend in  detail  7.  Further management depending on patient progress and response to the treatment  Hazel Sams, MD  P# [phone]

## 2021-02-24 NOTE — ED Notes (Signed)
While tending to another pt, noticed MR. Mofield coming ut of his room with PIV line pulled and blood noted in room. Pt unsure why he was coming out of room. He was redirected, linens changed, PIV re established,Primofit attached on suction due to pt's incontinence, placed back on monitor, call bell placed w/in reach.

## 2021-02-24 NOTE — Progress Notes (Signed)
 Rounded on Pt. Pt resting in bed with eyes closed. No further needs or concerns at this time.

## 2021-02-24 NOTE — Consults (Signed)
Consults by Bethann Goo, MD at 02/24/21 1738                Author: Bethann Goo, MD  Service: Neurosurgery  Author Type: Physician       Filed: 02/24/21 1751  Date of Service: 02/24/21 1738  Status: Signed          Editor: Bethann Goo, MD (Physician)            Consult Orders        1. CONSULT  PHYSICIAN [[id number]] ordered by Elveria Royals, MD at 02/24/21 1631                                      NEUROSURGERY CONSULT NOTE          Patient: Thomas Jackson  MRN: [id number]   SSN: [id number]          Date of Birth: 1970-10-02   Age: 50 y.o.   Sex: male         CONSULT DATE: 02/24/2021      REQUESTING PHYSICIAN:  No referring provider defined for this encounter. .ref      CONSULTING PHYSICAIN:  Bethann Goo, MD      REASON FOR CONSULT: Right thalamic hemorrhage and intraventricular hemorrhage      HPI: This patient is a  50 y.o.  handed   male who presents with a history of left PCA infarction.  Apparently he  has had a known hemorrhage in the right thalamus noted on an MRI scan of the brain last January.  His family reports he has not been himself over the last 4 days.  They think he may have fallen and was on the floor for 30 minutes over the weekend.  His  current neurologic baseline is not exactly clear.   He has been hypertensive in the 160s.      He had a CT scan at the [facility] which I have been able to review.  There is a old left PCA infarction through the temporal and occipital lobes open to the parietal area.  An old left putaminal either hemorrhage now hypodensity.  Currently  there is a hemorrhage in the right thalamus about 2 cm with a small amount of blood in the third ventricle.  No hydrocephalus.        Patient Active Problem List        Diagnosis  Code         ?  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  Amoxicillin  Other (comments)         ?  Penicillin G  Unknown (comments)           No past medical history on file.       No past surgical history on file.        Social History          Socioeconomic History         ?  Marital status:  SINGLE              Spouse name:  Not on file         ?  Number of children:  Not  on file     ?  Years of education:  Not on file     ?  Highest education level:  Not on file       Occupational History        ?  Not on file       Tobacco Use         ?  Smoking status:  Not on file     ?  Smokeless tobacco:  Not on file       Substance and Sexual Activity         ?  Alcohol use:  Not on file     ?  Drug use:  Not on file     ?  Sexual activity:  Not on file        Other Topics  Concern        ?  Not on file       Social History Narrative        ?  Not on file          Social Determinants of Health          Financial Resource Strain:         ?  Difficulty of Paying Living Expenses: Not on file       Food Insecurity:         ?  Worried About Running Out of Food in the Last Year: Not on file     ?  Ran Out of Food in the Last Year: Not on file       Transportation Needs:         ?  Lack of Transportation (Medical): Not on file     ?  Lack of Transportation (Non-Medical): Not on file       Physical Activity:         ?  Days of Exercise per Week: Not on file     ?  Minutes of Exercise per Session: Not on file       Stress:         ?  Feeling of Stress : Not on file       Social Connections:         ?  Frequency of Communication with Friends and Family: Not on file     ?  Frequency of Social Gatherings with Friends and Family: Not on file     ?  Attends Religious Services: Not on file     ?  Active Member of Clubs or Organizations: Not on file     ?  Attends Banker Meetings: Not on file     ?  Marital Status: Not on file       Intimate Partner Violence:         ?  Fear of Current or Ex-Partner: Not on file     ?  Emotionally Abused: Not on file     ?  Physically Abused: Not on file     ?  Sexually Abused: Not on file       Housing Stability:         ?  Unable to Pay for Housing in the Last  Year: Not on file     ?  Number of Places Lived in the Last Year: Not on file        ?  Unstable Housing in the Last Year: Not on file  No family history on file.        Current Facility-Administered Medications          Medication  Dose  Route  Frequency           ?  niCARdipine (CARDENE) 50 mg in 0.9% sodium chloride 250 mL infusion   0-15 mg/hr  IntraVENous  TITRATE           ?  dextrose (D50W) injection syrg 5-25 g   10-50 mL  IntraVENous  PRN     ?  glucagon (GLUCAGEN) injection 1 mg   1 mg  IntraMUSCular  PRN     ?  insulin glargine (LANTUS) injection 1-100 Units   1-100 Units  SubCUTAneous  QHS     ?  insulin lispro (HUMALOG) injection 1-100 Units   1-100 Units  SubCUTAneous  Q6H     ?  rosuvastatin (CRESTOR) tablet 10 mg   10 mg  Oral  QHS           ?  levETIRAcetam (KEPPRA) 500 mg in saline (iso-osm) 100 mL IVPB (premix)   500 mg  IntraVENous  Q12H          No current outpatient medications on file.           EXAM:        Visit Vitals      BP  (!) 158/74     Pulse  75     Temp  98.4 ??F (36.9 ??C)     Resp  18        SpO2  96%              Labs Reviewed:   No results found for this or any previous visit (from the past 24 hour(s)).      Mental Status and language function:      He is awake and alert.  Pupils are reactive face is symmetric.  He mainly has a receptive aphasia with word salad.  He is unable to name objects or repeat.  He can follow commands with visual cues.  He is able to raise both hands without a drift move  both lower limbs symmetrically.      LABS:        CBC       No results found for: WBC, RBC, HCT, MCV, MCH, MCHC, RDW, HCTEXT       Basic Metabolic Profile       No results found for: NA, POTASSIUM, CHLORIDE, CO2, BUN           Coagulation       No results found for: INR, APTT, INREXT            STUDIES:   As above      IMPRESSION:    50 y.o.  male with history of left  PCA infarction and left putaminal hemorrhage or infarction   Now with right thalamic hemorrhage, apparently  chronic and noted on an MRI in January, small amount of intraventricular hemorrhage         PLAN:   Certainly no plans for surgical intervention.   We should keep the systolic blood pressure less than 150, given the unclear etiology of his left PCA infarction.   Follow-up CT scan in the morning         Bethann Goo, MD   02/24/2021    5:38 PM

## 2021-02-24 NOTE — ED Notes (Signed)
[facility] transfer  50 y/o male- [facility]  Cc: confusion, seizures, frequent falls since Thursday. R side weakness. Aphasia  CT thalamic bleed  Received Keppra.  Cardene goal SBP <150  139/77  NIH- 6    EMS reports stopping cardene en route per verbal order as systolic was 119

## 2021-02-24 NOTE — ED Notes (Signed)
Call ahead note:    [facility] transfer  50 y/o male- [facility]  Cc: confusion, seizures, frequent falls since Thursday. R side weakness. Aphasia  CT thalamic bleed  Received Keppra.  Cardene goal SBP <150  139/77  NIH- 6

## 2021-02-24 NOTE — Consults (Signed)
[facility]     ICU Consult Note    PATIENT:  Thomas Jackson  SEX:   male  DOB: 1971/02/08  AGE: 50 y.o.  DOA: 02/24/2021  LOS: LOS: 0 days   Code Status: No Order    DATE OF CONSULT:February 24, 2021    REFERRING PHYSICIAN: Dr. Geanie Cooley    REASON FOR CONSULTATION: ICU management, intracranial hemorrhage    ASSESSMENT:     - Acute right thalamic hemorrhage with new minimal extension into the third ventricle on CT 02/24/2021 at [facility].  This is increased since previously visualized hemorrhage on August 15, 2020.   - Acute on chronic kidney disease. Baseline creatinine 3 in 09/2020. Current creatinine is 3.7 at [facility]  - Seizure disorder  - Diabetes  - Hypertension: uncontrolled. Suspect Noncomplaince with meds.  - History of coronary artery disease  - Hypothyroidism following partial thyroidectomy  - Hyperlipidemia   -History of stroke in 2013 with right-sided deficits  - History of intracranial hemorrhage January 2022 seen at [facility] in [facility]    PLAN:     -Currently on room air. Supplemental PRN  to keep SpO2 >92%.  - Obtain CBC, CMP and INR  -Cardene as needed to maintain systolic blood pressure less than 140  -Aspiration precautions  - Keppra twice daily,  -No intervention per neurosurgery at this time.  - Repeat head CT in a.m.  - Home medications per hospitalist  - NIH and neurochecks per ICU protocol  -Avoid nephrotoxins. Renally dose medications. Monitor urine output. Daily weights  -Electrolyte replacements as needed.   -SLP, OT and PT consults  -Glucomander per protocol  -SUP: Protonix    -DVT prophylaxis: SCDs, no anticoagulation due to intracranial hemorrhage    -BMP and CBC in the AM    -Further recommendations will be based on the patient's response to recommended treatment and results of the investigation ordered.    [x] Total critical care time exclusive of procedures 35 minutes with complex decision  making performed and > 50% time spent in face to face consultation.    HISTORY OF PRESENT ILLNESS     Thomas Jackson is a 50 y.o. WHITE/NON-HISPANIC male with PMHx of diabetes with insulin dependence, seizures, hypertension, CAD and previous intracranial hemorrhage in January 2022 who went to the [facility] after family felt that he had been not himself for around 4 days.  They felt that he may have had some falls at home.  Patient at [facility] was found to have intracranial hemorrhage.  It was determined he needed to be transferred to [facility].  Upon arrival to [facility], neurosurgery was consulted and determined that patient could not have CTA due to creatinine of greater than 3.  [facility] was consulted for admission and PCCM for ICU management.    Interval History/Events:  Patient is alert in his bed.  Nods his head appropriately but when asked a specific question gives answer that is not related to the question.  Patient has spontaneous movement of all 4 extremities.  No discernible weakness.  No facial droop.  Pupils are equal and reactive.  Patient's blood pressure is greater than 150.  I have restarted the Cardene drip and notified the bedside nurse.    Past Medical History  No past medical history on file.    Past Surgical History  No past surgical history  on file.    Family History  No family history on file.    Social History  Social History     Tobacco Use   ??? Smoking status: Not on file   ??? Smokeless tobacco: Not on file   Substance Use Topics   ??? Alcohol use: Not on file   ??? Drug use: Not on file        Medications  Prior to Admission medications    Not on File       Current Facility-Administered Medications   Medication Dose Route Frequency   ??? niCARdipine (CARDENE) 50 mg in 0.9% sodium chloride 250 mL infusion  0-15 mg/hr IntraVENous TITRATE     No current outpatient medications on file.       Allergy  Allergies    Allergen Reactions   ??? Amoxicillin Other (comments)   ??? Penicillin G Unknown (comments)         ROS     Review of Systems     Unable to perform an adequate HPI and ROS within the constraints imposed by the patient's altered mental status.    Physical Exam        GENERAL: Awake, alert, comfortable.  Aphasic with nonsensical response  HEENT: Oral mucosa moist. No thrush.   NECK: Supple. Trachea midline.   RESPIRATORY: Bilateral BS present, decreased at bases. No rales or rhonchi.   CARDIOVASCULAR: S1 and S2 present. No murmur, rub, or thrill.   ABDOMEN: Soft and nontender with positive bowel sounds. No organomegaly.   MUSCULOSKELETAL: No joint effusions or joint tenderness.   SKIN: No rash. Skin is warm, dry, and intact.   NEUROLOGICAL: Conscious, aphasic moving all 4 extremities with no obvious deficit.      Patient Vitals for the past 24 hrs:   Temp Pulse Resp BP SpO2   02/24/21 1647 98.4 ??F (36.9 ??C) -- -- -- --   02/24/21 1637 -- 75 18 (!) 158/74 96 %       Intake/Output:   Last shift:      No intake/output data recorded.  Last 3 shifts: No intake/output data recorded.  No intake or output data in the 24 hours ending 02/24/21 1711     Imaging:  CT Results (most recent):  No results found for this or any previous visit.    XR Results (most recent):  No results found for this or any previous visit.    Reviewed CT results from [facility] hospital.    [x] See my orders for details    My assessment, plan of care, findings, medications, side effects etc were discussed with:  [x] nursing [] PT/OT    [x] respiratory therapy [x] Dr.   [] family [x] Patient     Thank you for asking to assist in this patient's care. We will follow along with you.    , ACNPC-AG, FNP  Critical Care Nurse Practitioner  Pager: [phone]  February 24, 2021  5:11 PM         Dragon medical dictation software was used for portions of this report. Unintended voice transcription errors may have occurred.    I have reviewed the patient's  chart. I saw and examined the patient, personally performed the critical or key portions of the service and discussed the management with the treatment team. I reviewed the NP's note and concur with the history, findings and plan of care, which include's my edits and my assessment and plan. Laboratory, hemodynamic and radiological data were reviewed.  Murriel Hopper MD

## 2021-02-24 NOTE — Progress Notes (Signed)
Initial Stroke Team Assessment  Time Patient Arrived: 1620  Time Stroke Activated: 1630  Last known Time/Date at Baseline: 02/20/21  Physician at Bedside: 1623  Patient transferred from [facility], per notes increasing confusion per his family stating since the 14th not acting himself she states thinks he has fallen may be spent 30 minutes on the floor this weekend having increased incontinence.  Hx of R thalamic hemorrhage in January 2022. CT scan at [facility] shows hyperdensity within right thalamus consistent with acute rehemorrhage.  Patient is on Cardene gtt.    NIHSS:  Total: 5 (02/24/21 1721)     ED Triage Vitals   Enc Vitals Group      BP 02/24/21 1637 (!) 158/74      Pulse (Heart Rate) 02/24/21 1637 75      Resp Rate 02/24/21 1637 18      Temp 02/24/21 1647 98.4 F (36.9 C)      Temp src --       O2 Sat (%) 02/24/21 1637 96 %      Weight --       Height --       Head Circumference --       Peak Flow --       Pain Score --       Pain Loc --       Pain Edu? --       Excl. in GC? --        Swallow screen was deferred.    Stroke education brochure including information on hemorrhagic stroke was provided to: patient.  Rationale for acute work-up of symptoms explained.

## 2021-02-24 NOTE — ED Notes (Signed)
Report given to Travis, RN

## 2021-02-24 NOTE — ED Provider Notes (Signed)
ED Provider Notes by Elveria Royals, MD at 02/24/21 1630                Author: Elveria Royals, MD  Service: Emergency Medicine  Author Type: Physician       Filed: 02/24/21 1658  Date of Service: 02/24/21 1630  Status: Signed          Editor: Elveria Royals, MD (Physician)               [facility]   Emergency Department Treatment Report          Patient: Thomas Jackson  Age: 50 y.o.  Sex: male          Date of Birth: [birth year]  Admit Date: 02/24/2021  PCP: Smith Mince, Not On File, MD     MRN: [id number]   CSN: [id number]            Room: ER23/ER23  Time Dictated: 4:30 PM            I hereby certify this patient for admission based upon medical necessity as ??   noted below:      Chief Complaint    tx [facility] ER        History of Present Illness     50 y.o. male  transferred from the [facility] ER for abnormal CT.      Per [facility] record patient presenting with increasing confusion family stating since the 14th oriented not acting himself she states thinks he has fallen may be spent 30 minutes on the floor this weekend having increased incontinence no fevers  or chills with a history of stroke in January.  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  CO2 26.  Magnesium 1.8, CBC showing a hemoglobin of 12.6 white count 13.8 and platelets of 367             Anatomical Region  Laterality  Modality         Extremity  --  Digital Radiography           Impression    Performed by [facility]  (ACCREDITED BY THE COL[employer]GE OF AMER[employer] PATHOLOGISTS)   IMPRESSION:     1. No significant acute osseous abnormality.                 Reading Doctor: Blanchard Mane     Electronic Signature by:  Blanchard Mane      Narrative   Performed by [facility]  (ACCREDITED BY THE COLLEGE OF AMERICAN PATHOLOGISTS)   . HISTORY: Fall     FINDINGS: 2 views of the right foreleg obtained. There is no evidence of fracture or dislocation. No osteolytic or osteoblastic bone abnormalities are  identified. There are no radiopaque foreign bodies.      Procedure  Note      Ailstock, Sharion Balloon, MD - 02/24/2021   Formatting of this note might be different from the original.   . HISTORY: Fall     FINDINGS: 2 views  of the right foreleg obtained. There is no evidence of fracture or dislocation. No osteolytic or osteoblastic bone abnormalities are identified. There are no radiopaque foreign bodies.     IMPRESSION   IMPRESSION:     1. No significant  acute osseous abnormality.                 Reading Doctor: Blanchard Mane     Electronic Signature by: Blanchard Mane      Specimen Collected: 02/24/21 12:16 PM  Last Resulted: 02/24/21 12:17 PM     Received From: [facility]     Result Received: 02/24/21 ??4:27 PM                  Impression   Performed by [facility]  (ACCREDITED BY THE COLLEGE OF AMERICAN PATHOLOGISTS)   IMPRESSION:     1. No acute cardiopulmonary abnormality.                     Reading Doctor: Blanchard Mane     Electronic Signature  by: Blanchard Mane      Narrative   Performed by [facility]  (ACCREDITED BY THE COLLEGE OF AMERICAN PATHOLOGISTS)   HISTORY: ??Confusion     FINDINGS: ?? AP ??chest radiograph demonstrates normal cardiac silhouette size with ??normal pulmonary vascularity. Lungs are clear. There  is no evidence of pneumothorax or pleural effusion.      Procedure Note      Ailstock, Sharion Balloon, MD - 02/24/2021   Formatting of this note might be  different from the original.   HISTORY: ??Confusion     FINDINGS:  ?? AP ??chest radiograph demonstrates normal cardiac silhouette size with ??normal pulmonary vascularity. Lungs are clear. There is no evidence of pneumothorax or pleural effusion.     IMPRESSION   IMPRESSION:     1. No acute cardiopulmonary  abnormality.                     Reading Doctor: Blanchard Mane     Electronic Signature by: Blanchard Mane      Specimen Collected: 02/24/21 12:13 PM  Last Resulted: 02/24/21 12:16 PM     Received From: [facility]     Result Received: 02/24/21 ??4:27 PM              Impression   Performed by [facility]  (ACCREDITED BY THE COLLEGE OF AMERICAN PATHOLOGISTS)   IMPRESSION:     1. 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 Hyperdensity noted within right thalamus consistent  with acute rehemorrhage, maximum dimension 20 mm with new minimal extension to third ventricle increased since hemorrhage visualized on 15 August 2020.       Clinical findings of acute ischemia can precede CT findings by 24 to 48 hours and sometimes  no CT findings develop. MRI with diffusion weighted images has increased early and??overall sensitivity for ischemic findings and may be indicated.                     Reading Doctor: Blanchard Mane     Electronic  Signature by: Blanchard Mane      Narrative   Performed by [facility]  (  ACCREDITED BY THE COLLEGE OF AMERICAN PATHOLOGISTS)   HISTORY: ??Confusion     COMPARISON: 15 August 2020     TECHNIQUE: Axial noncontrast 5 mm images are obtained from skull base to vertex without intravenous contrast.      FINDINGS: Visualized orbits, paranasal sinuses, and mastoid air cells are unremarkable. 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 Hyperdensity noted within right thalamus consistent with  acute rehemorrhage, maximum dimension 20 mm with new minimal  extension to third ventricle. ??Skull base and calvarial bone window images are unremarkable.      Procedure Note      Ailstock, Sharion BalloonLysle K, MD - 02/24/2021   Formatting of this note might be different from the original.   HISTORY: ??Confusion     COMPARISON:  15 August 2020     TECHNIQUE: Axial noncontrast 5 mm images are obtained from skull base to vertex without intravenous contrast.     FINDINGS: Visualized orbits, paranasal sinuses, and mastoid air cells are unremarkable. Left lateral ventricle  hydrocephalus ex vacuo and extensive encephalomalacia left temporoparietal region. Hyperdensity noted within right thalamus consistent with acute rehemorrhage, maximum dimension 20 mm with new minimal extension to third ventricle. ??Skull base and calvarial  bone window images are unremarkable.     IMPRESSION   IMPRESSION:     1. 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 Hyperdensity noted within right thalamus consistent with  acute rehemorrhage, maximum dimension 20 mm with new minimal extension to third ventricle increased since hemorrhage visualized on 15 August 2020.       Clinical findings of acute ischemia can precede CT findings by 24 to 48 hours and sometimes  no CT findings develop. MRI with diffusion weighted images has increased early and overall sensitivity for ischemic findings and may be indicated.                     Reading Doctor: Blanchard ManeAilstock, Lysle     Electronic  Signature by: Blanchard ManeAilstock, Lysle      Specimen Collected: 02/24/21 12:08 PM  Last Resulted: 02/24/21 12:13 PM     Received From: [facility]     Result Received: 02/24/21 ??4:27 PM               Impression   [facility]formed by [facility]  (ACCREDITED BY THE COLLEGE OF AMERICAN PATHOLOGISTS)   IMPRESSION:     1. 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 Hyperdensity noted within right thalamus consistent  with acute  rehemorrhage, maximum dimension 20 mm with new minimal extension to third ventricle increased since hemorrhage visualized on 15 August 2020.       Clinical findings of acute ischemia can precede CT findings by 24 to 48 hours and sometimes  no CT findings develop. MRI with diffusion weighted images has increased early and??overall sensitivity for ischemic findings and may be indicated.                     Reading Doctor: Blanchard ManeAilstock, Lysle     Electronic  Signature by: Blanchard ManeAilstock, Lysle      Narrative   [facility]formed by [facility]  (ACCREDITED BY THE COLLEGE OF AMERICAN PATHOLOGISTS)   HISTORY: ??Confusion     COMPARISON: 15 August 2020     TECHNIQUE: Axial noncontrast 5 mm images are obtained from skull base to vertex without intravenous contrast.  FINDINGS: Visualized orbits, paranasal sinuses, and mastoid air cells are unremarkable. 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 Hyperdensity noted within right thalamus consistent with  acute rehemorrhage, maximum dimension 20 mm with new minimal extension to third ventricle. ??Skull base and calvarial bone window images are unremarkable.      Procedure Note      Ailstock, Sharion Balloon, MD - 02/24/2021   Formatting of this note might be different from the original.   HISTORY: ??Confusion     COMPARISON:  15 August 2020     TECHNIQUE: Axial noncontrast 5 mm images are obtained from skull base to vertex without intravenous contrast.     FINDINGS: Visualized orbits, paranasal sinuses, and mastoid air cells are unremarkable. Left lateral ventricle  hydrocephalus ex vacuo and extensive encephalomalacia left temporoparietal region. Hyperdensity noted within right thalamus consistent with acute rehemorrhage, maximum dimension 20 mm with new minimal extension to third ventricle. ??Skull base and calvarial  bone window images are unremarkable.     IMPRESSION   IMPRESSION:     1. Left lateral ventricle hydrocephalus ex  vacuo and extensive encephalomalacia left temporoparietal region. Hyperdensity noted within right thalamus consistent with  acute rehemorrhage, maximum dimension 20 mm with new minimal extension to third ventricle increased since hemorrhage visualized on 15 August 2020.       Clinical findings of acute ischemia can precede CT findings by 24 to 48 hours and sometimes  no CT findings develop. MRI with diffusion weighted images has increased early and overall sensitivity for ischemic findings and may be indicated.                     Reading Doctor: Blanchard Mane     Electronic  Signature by: Blanchard Mane      Specimen Collected: 02/24/21 12:08 PM  Last Resulted: 02/24/21 12:13 PM     Received From: [facility]     Result Received: 02/24/21 ??4:27 PM                    Review of Systems     Review of Systems    Unable to perform ROS: Mental status change           Past Medical/Surgical History     No past medical history on file.   No past surgical history on file.          Past Medical History:   Diagnosis Date   ? CVA (cerebral infarction) 07/2012   R SIDE AFFECTED   ? Diabetes mellitus (CMS/HCC)    ? HLD (hyperlipidemia)   ? HTN (hypertension)   ? Hypothyroid   ? Seizure (CMS/HCC)     Past Surgical History:   Procedure Laterality Date   ? SUR - OTHER: SPECIFY IN COMMENTS   partial thyroidectomy--non  [place]             Social History          Social History          Socioeconomic History         ?  Marital status:  SINGLE        Unable to obtain due to AMS     Family History     No family history on file.   Unable to obtain due to AMS     Current Medications     List from [facility] reviewed; RN to enter  None          Allergies          Allergies        Allergen  Reactions         ?  Amoxicillin  Other (comments)         ?  Penicillin G  Unknown (comments)          Physical Exam          ED Triage Vitals     Enc Vitals Group           BP          Pulse          Resp          Temp          Temp src          SpO2           Weight          Height          Head Circumference          Peak Flow          Pain Score          Pain Loc          Pain Edu?             Excl. in GC?             Physical Exam   HENT:       Head: Normocephalic.   Eyes :       Conjunctiva/sclera: Conjunctivae normal.      Pupils: Pupils are equal, round, and reactive to light.   Cardiovascular:       Rate and Rhythm: Normal rate and regular rhythm.   Pulmonary :       Effort: Pulmonary effort is normal.      Breath sounds: Normal breath sounds.   Abdominal :      Palpations: Abdomen is soft.      Tenderness: There is no abdominal tenderness.     Musculoskeletal:      Cervical back: No tenderness.      Right lower leg: No edema.      Left lower leg: No edema.    Skin:      General: Skin is dry.   Neurological :       Mental Status: He is alert.      Comments: Patient is alert.  When I asked him his name he starts talking about something that does not make sense.  When asked in the location he is talks  about something that does not make sense.  When I ask him the year he says "it is my birthday"    He does participate in exam, he has no drift in the LUE or LLE  He has mild drift in RUE and RLE  No obvious facial droop  He indicates  he can feel me touching the face arms legs b/l      Psychiatric:         Mood and Affect: Mood normal.             Impression and Management Plan     Patient transferred from outside ER he is awake and alert.   He follows commands but is confused   Consult neurosurgery consult [facility] for admission      Disc is given with images from  [facility] to the x-ray tech to upload into the system     Diagnostic Studies     Lab:    No results found for this or any previous visit (from the past 12 hour(s)).        ED Course/Medical Decision Making     Patient discussed with Dr. Su Ley neurosurgery he recommends admit to ICU, does not recommend CTA at this time, recommends systolic blood pressure less than 140 goal      Consulted ICU nurse  practitioner [facility] deans      Consulted Dr. Tasia Catchings medicine he will admit to ICU         Medications       niCARdipine (CARDENE) 50 mg in 0.9% sodium chloride 250 mL infusion (has no administration in time range)            Critical care time excluding procedures, but including direct patient care, reviewing medical records, evaluating results of diagnostic testing, discussions with family members, and consulting with physicians:3minutes        Final Diagnosis                 ICD-10-CM  ICD-9-CM          1.  Thalamic hemorrhage [facility])   I61.0  431             Disposition     ICU      Elveria Royals, MD   February 24, 2021         Dragon dictation software was used for portions of this report. Unintended errors in transcription may occur.      My signature above authenticates this document and my orders, the final ??   diagnosis (es), discharge prescription (s), and instructions in the Epic ??   record.   If you have any questions please contact [phone].   ??   Nursing notes have been reviewed by the physician/ advanced practice ??   Clinician.

## 2021-02-25 ENCOUNTER — Inpatient Hospital Stay: Admit: 2021-02-25 | Payer: MEDICARE | Primary: Family

## 2021-02-25 LAB — COMPREHENSIVE METABOLIC PANEL
ALT: 26 U/L (ref 10–49)
AST: 15 U/L (ref 0.0–33.9)
Albumin: 2.9 gm/dl — ABNORMAL LOW (ref 3.4–5.0)
Alkaline Phosphatase: 109 U/L (ref 46–116)
Anion Gap: 11 mmol/L (ref 5–15)
BUN: 44 mg/dl — ABNORMAL HIGH (ref 9–23)
CO2: 22 mEq/L (ref 20–31)
Calcium: 8.6 mg/dl — ABNORMAL LOW (ref 8.7–10.4)
Chloride: 108 mEq/L — ABNORMAL HIGH (ref 98–107)
Creatinine: 3.53 mg/dl — ABNORMAL HIGH (ref 0.70–1.30)
EGFR IF NonAfrican American: 20
GFR African American: 24
Glucose: 110 mg/dl — ABNORMAL HIGH (ref 74–106)
Potassium: 5 mEq/L — ABNORMAL HIGH (ref 3.4–4.5)
Sodium: 141 mEq/L (ref 136–145)
Total Bilirubin: 0.5 mg/dl (ref 0.30–1.20)
Total Protein: 5.7 gm/dl (ref 5.7–8.2)

## 2021-02-25 LAB — VITAMIN B12
VITAMIN B12: 377 pg/ml (ref 211–911)
Vitamin B12: 377 pg/ml (ref 211–911)

## 2021-02-25 LAB — POCT GLUCOSE
POC Glucose: 107 mg/dL — ABNORMAL HIGH (ref 65–105)
POC Glucose: 104 mg/dL (ref 65–105)
POC Glucose: 118 mg/dL — ABNORMAL HIGH (ref 65–105)
POC Glucose: 115 mg/dL — ABNORMAL HIGH (ref 65–105)
POC Glucose: 162 mg/dL — ABNORMAL HIGH (ref 65–105)

## 2021-02-25 LAB — CBC WITH AUTO DIFFERENTIAL
Basophils %: 0.5 % (ref 0–3)
Eosinophils %: 1.1 % (ref 0–5)
Hematocrit: 37 % (ref 37.0–50.0)
Hemoglobin: 12.5 gm/dl (ref 12.4–17.2)
Immature Granulocytes: 0.3 % (ref 0.0–3.0)
Lymphocytes %: 23.2 % — ABNORMAL LOW (ref 28–48)
MCH: 32.1 pg (ref 23.0–34.6)
MCHC: 33.8 gm/dl (ref 30.0–36.0)
MCV: 95.1 fL (ref 80.0–98.0)
MPV: 9.1 fL (ref 6.0–10.0)
Monocytes %: 7.1 % (ref 1–13)
Neutrophils %: 67.8 % — ABNORMAL HIGH (ref 34–64)
Nucleated RBCs: 0 (ref 0–0)
Platelets: 384 10*3/uL (ref 140–450)
RBC: 3.89 M/uL (ref 3.80–5.70)
RDW-SD: 42.5 (ref 35.1–43.9)
WBC: 14.8 10*3/uL — ABNORMAL HIGH (ref 4.0–11.0)

## 2021-02-25 LAB — BASIC METABOLIC PANEL
Anion Gap: 9 mmol/L (ref 5–15)
BUN: 38 mg/dl — ABNORMAL HIGH (ref 9–23)
CO2: 22 mEq/L (ref 20–31)
Calcium: 8.2 mg/dl — ABNORMAL LOW (ref 8.7–10.4)
Chloride: 109 mEq/L — ABNORMAL HIGH (ref 98–107)
Creatinine: 3.5 mg/dl — ABNORMAL HIGH (ref 0.70–1.30)
EGFR IF NonAfrican American: 20
GFR African American: 24
Glucose: 108 mg/dl — ABNORMAL HIGH (ref 74–106)
Potassium: 4.8 mEq/L — ABNORMAL HIGH (ref 3.4–4.5)
Sodium: 140 mEq/L (ref 136–145)

## 2021-02-25 LAB — TSH 3RD GENERATION
TSH: 2.782 u[IU]/mL (ref 0.550–4.780)
TSH: 2.782 u[IU]/mL (ref 0.550–4.780)

## 2021-02-25 LAB — HEMOGLOBIN A1C W/O EAG
Hemoglobin A1C: 6.1 % — ABNORMAL HIGH (ref 3.8–5.6)
Hemoglobin A1c: 6.1 % — ABNORMAL HIGH (ref 3.8–5.6)

## 2021-02-25 LAB — LIPID PANEL
CHOL/HDL Ratio: 3.9 Ratio (ref 0.0–5.0)
Chol/HDL Ratio: 3.9 Ratio (ref 0.0–5.0)
Cholesterol, Total: 134 mg/dl (ref 0–199)
Cholesterol, total: 134 mg/dl (ref 0–199)
HDL Cholesterol: 34 mg/dl — ABNORMAL LOW (ref 40–60)
HDL: 34 mg/dl — ABNORMAL LOW (ref 40–60)
LDL Calculated: 64 mg/dl (ref 0–130)
LDL, calculated: 64 mg/dl (ref 0–130)
Triglyceride: 181 mg/dl — ABNORMAL HIGH (ref 0–150)
Triglycerides: 181 mg/dl — ABNORMAL HIGH (ref 0–150)

## 2021-02-25 LAB — MAGNESIUM
Magnesium: 1.7 mg/dL (ref 1.6–2.6)
Magnesium: 1.7 mg/dL (ref 1.6–2.6)

## 2021-02-25 LAB — PHOSPHORUS
Phosphorus: 5 mg/dL (ref 2.4–5.1)
Phosphorus: 5 mg/dL (ref 2.4–5.1)

## 2021-02-25 LAB — CBC WITH AUTOMATED DIFF
BASOPHILS: 0.5 % (ref 0–3)
EOSINOPHILS: 1.1 % (ref 0–5)
HCT: 37 % (ref 37.0–50.0)
HGB: 12.5 gm/dl (ref 12.4–17.2)
IMMATURE GRANULOCYTES: 0.3 % (ref 0.0–3.0)
LYMPHOCYTES: 23.2 % — ABNORMAL LOW (ref 28–48)
MCH: 32.1 pg (ref 23.0–34.6)
MCHC: 33.8 gm/dl (ref 30.0–36.0)
MCV: 95.1 fL (ref 80.0–98.0)
MONOCYTES: 7.1 % (ref 1–13)
MPV: 9.1 fL (ref 6.0–10.0)
NEUTROPHILS: 67.8 % — ABNORMAL HIGH (ref 34–64)
NRBC: 0 (ref 0–0)
PLATELET: 384 10*3/uL (ref 140–450)
RBC: 3.89 M/uL (ref 3.80–5.70)
RDW-SD: 42.5 (ref 35.1–43.9)
WBC: 14.8 10*3/uL — ABNORMAL HIGH (ref 4.0–11.0)

## 2021-02-25 LAB — METABOLIC PANEL, BASIC
Anion gap: 9 mmol/L (ref 5–15)
BUN: 38 mg/dl — ABNORMAL HIGH (ref 9–23)
CO2: 22 meq/L (ref 20–31)
Calcium: 8.2 mg/dl — ABNORMAL LOW (ref 8.7–10.4)
Chloride: 109 mEq/L — ABNORMAL HIGH (ref 98–107)
Creatinine: 3.5 mg/dl — ABNORMAL HIGH (ref 0.70–1.30)
GFR est AA: 24
GFR est non-AA: 20
Glucose: 108 mg/dl — ABNORMAL HIGH (ref 74–106)
Potassium: 4.8 mEq/L — ABNORMAL HIGH (ref 3.4–4.5)
Sodium: 140 meq/L (ref 136–145)

## 2021-02-25 LAB — METABOLIC PANEL, COMPREHENSIVE
ALT (SGPT): 26 U/L (ref 10–49)
AST (SGOT): 15 U/L (ref 0.0–33.9)
Albumin: 2.9 gm/dl — ABNORMAL LOW (ref 3.4–5.0)
Alk. phosphatase: 109 U/L (ref 46–116)
Anion gap: 11 mmol/L (ref 5–15)
BUN: 44 mg/dl — ABNORMAL HIGH (ref 9–23)
Bilirubin, total: 0.5 mg/dl (ref 0.30–1.20)
CO2: 22 mEq/L (ref 20–31)
Calcium: 8.6 mg/dl — ABNORMAL LOW (ref 8.7–10.4)
Chloride: 108 mEq/L — ABNORMAL HIGH (ref 98–107)
Creatinine: 3.53 mg/dl — ABNORMAL HIGH (ref 0.70–1.30)
GFR est AA: 24
GFR est non-AA: 20
Glucose: 110 mg/dl — ABNORMAL HIGH (ref 74–106)
Potassium: 5 mEq/L — ABNORMAL HIGH (ref 3.4–4.5)
Protein, total: 5.7 gm/dl (ref 5.7–8.2)
Sodium: 141 mEq/L (ref 136–145)

## 2021-02-25 LAB — GLUCOSE, POC
Glucose (POC): 107 mg/dL — ABNORMAL HIGH (ref 65–105)
Glucose (POC): 104 mg/dL (ref 65–105)
Glucose (POC): 118 mg/dL — ABNORMAL HIGH (ref 65–105)
Glucose (POC): 115 mg/dL — ABNORMAL HIGH (ref 65–105)
Glucose (POC): 162 mg/dL — ABNORMAL HIGH (ref 65–105)

## 2021-02-25 MED ORDER — INSULIN LISPRO 100 UNIT/ML INJECTION
100 unit/mL | SUBCUTANEOUS | Status: DC | PRN
Start: 2021-02-25 — End: 2021-02-26

## 2021-02-25 MED ORDER — INSULIN GLARGINE 100 UNIT/ML INJECTION
100 unit/mL | Freq: Once | SUBCUTANEOUS | Status: DC | PRN
Start: 2021-02-25 — End: 2021-02-26

## 2021-02-25 MED ORDER — INSULIN GLARGINE 100 UNIT/ML INJECTION
100 unit/mL | Freq: Every evening | SUBCUTANEOUS | Status: DC
Start: 2021-02-25 — End: 2021-02-26
  Administered 2021-02-26: 02:00:00 via SUBCUTANEOUS

## 2021-02-25 MED ORDER — METOPROLOL TARTRATE 25 MG TAB
25 mg | Freq: Two times a day (BID) | ORAL | Status: DC
Start: 2021-02-25 — End: 2021-02-26
  Administered 2021-02-25 – 2021-02-26 (×2): via ORAL

## 2021-02-25 MED ORDER — DEXTROSE 50% IN WATER (D50W) IV SYRG
INTRAVENOUS | Status: DC | PRN
Start: 2021-02-25 — End: 2021-02-26

## 2021-02-25 MED ORDER — INSULIN LISPRO 100 UNIT/ML INJECTION
100 unit/mL | Freq: Four times a day (QID) | SUBCUTANEOUS | Status: DC
Start: 2021-02-25 — End: 2021-02-26

## 2021-02-25 MED ORDER — LEVETIRACETAM 500 MG TAB
500 mg | Freq: Two times a day (BID) | ORAL | Status: DC
Start: 2021-02-25 — End: 2021-03-05
  Administered 2021-02-26 – 2021-03-05 (×16): via ORAL

## 2021-02-25 MED ORDER — GLUCAGON 1 MG INJECTION
1 mg | INTRAMUSCULAR | Status: DC | PRN
Start: 2021-02-25 — End: 2021-03-05

## 2021-02-25 MED ORDER — PANTOPRAZOLE 40 MG TAB, DELAYED RELEASE
40 mg | Freq: Every day | ORAL | Status: DC
Start: 2021-02-25 — End: 2021-03-05
  Administered 2021-02-26 – 2021-03-05 (×10): via ORAL

## 2021-02-25 MED ORDER — AMLODIPINE 5 MG TAB
5 mg | Freq: Every day | ORAL | Status: DC
Start: 2021-02-25 — End: 2021-03-05
  Administered 2021-02-25 – 2021-03-05 (×9): via ORAL

## 2021-02-25 MED FILL — LEVETIRACETAM IN SOD CHLORIDE (ISO-OSM) 500 MG/100 ML IV PIGGY BACK: 500 mg/100 mL | INTRAVENOUS | Qty: 100

## 2021-02-25 MED FILL — METOPROLOL TARTRATE 25 MG TAB: 25 mg | ORAL | Qty: 1

## 2021-02-25 MED FILL — CARDENE 40 MG/200 ML(0.2 MG/ML) IN SOD CHLOR(ISO-OSM) INTRAVENOUS SOLN: 40 mg/200 mL (0.2 mg/mL) | INTRAVENOUS | Qty: 200

## 2021-02-25 MED FILL — AMLODIPINE 5 MG TAB: 5 mg | ORAL | Qty: 2

## 2021-02-25 MED FILL — PANTOPRAZOLE 40 MG IV SOLR: 40 mg | INTRAVENOUS | Qty: 40

## 2021-02-25 MED FILL — SODIUM CHLORIDE 0.9 % IV: INTRAVENOUS | Qty: 1000

## 2021-02-25 NOTE — Progress Notes (Signed)
Problem: Communication Impaired (Adult)  Goal: *Acute Goals and Plan of Care (Insert Text)  Description: Reassessment due: 03/03/2021  To maximize expressive/ receptive language skills, pt will:  Utilize best means of communication to make basic wants/ needs known with moderate assist/ cues  1. Name objects/ items with 50% accuracy  2. Complete automatic speech tasks with 50% accuracy  3. Repeat word with 50% accuracy  4. Answer basic yes/ no questions with 50% accuracy  5. Follow 1 step commands with 80% accuracy    Outcome: Progressing Towards Goal     Problem: Dysphagia (Adult)  Goal: *Acute Goals and Plan of Care (Insert Text)  Description: Reassessment due: 03/03/2021  Goals:  Patient will:  1. Tolerate recommended diet with </= 1 overt/ soft/ silent s/s aspiration or distress b/s for adequate po nutrition/ hydration    Outcome: Progressing Towards Goal

## 2021-02-25 NOTE — Progress Notes (Signed)
 Discharge planning: Walker, cane, bed rails, shower chair, gait belt, wedges, an w/c. Disposition is SNF per spouse. Referral sent to [facility] via CC link. Needs medical transport at discharge.     Patient admitted on 02/24/2021 from [facility] with   Chief Complaint   Patient presents with   . Aphagia   . Stroke          The patient is being treated for    PMH: No past medical history on file.     Treatment Team: Treatment Team: Attending Provider: Cinderella Manila, MD; Consulting Provider: Cinderella Manila, MD; Consulting Provider: Aretta Fairy CROME, MD; Hospitalist: Cinderella Manila, MD; Consulting Provider: Adela Everitt BROCKS, MD; Occupational Therapist: Keith Leita POUR, OT; Speech Language Pathologist: Orvin Camie PARAS, SLP; Staff Nurse: Mckinley Melvena LABOR, RN; Nurse Navigator: Carey Corean DASEN, RN; Physical Therapist: Lavella Nat BROCKS, PT      The patient has been admitted to the hospital 0 times in the past 12 months.      Patient and Family/Caregivers Goals of Care: SNF    Caregivers Participating in Plan of Care/Discharge Plan with the patient: spouse via phone and CM    Tentative dc plan: SNF    Anticipated DME needs for discharge: TBD    PRESCREENING COMPLETED FOR SNF Yes  Will patient qualify for medicaid now or in the next 180 days? Has Medicaid      Has patient had covid vaccine? Yes  Date and type if not in chart under immunization field? Pfizer    Does patient have an ACP? Yes       Does the patient have appropriate clothing available to be worn at discharge? Yes      The patient and care participants are willing to travel 40 miles area for discharge facility.  The patient and plan of care participants have been provided with a list of all available Rehab Facilities or Home Health agencies as applicable. CM will follow up with a list of facilities or agencies that are offer acceptance.    CM has disclosed any financial interest that [facility] may have with any facility or agency.    Anticipated  Discharge Date: 03/04/21 tentative      Barriers to Healthcare Success/ Readmission Risk Factors: repeat CVAs    Consults:  Palliative Care Consult Recommended: n/a  Transitional Care Clinic Referral: n/a  Transitional Nurse Navigator Referral: n/a  Oncology Navigator Referral: n/a  SW consulted: Yes  [facility] Consulted: n/a  Outside Hospital/Community Resources Referrals and Collaboration: n/a    Food/Nutrition Needs: n/a                   Dietician Consulted: n/a    RRAT Score: Low Risk            9 Total Score    9 Pt. Coverage (Medicare=5 , Medicaid, or Self-Pay=4)        Criteria that do not apply:    Has Seen PCP in Last 6 Months (Yes=3, No=0)    Married. Living with Significant Other. Assisted Living. LTAC. SNF. or   Rehab    Patient Length of Stay (>5 days = 3)    IP Visits Last 12 Months (1-3=4, 4=9, >4=11)    Charlson Comorbidity Score (Age + Comorbid Conditions)           PCP: Trudy Alan Sheller, NP . How do you get to your doctor appointment? Spouse drives    Specialists:  Pulmonary, Neurosurgery    Dialysis Unit: n/a    [facility]   Location of pharmacy address: [place], [place]  Are there any medications that you have trouble paying for? NO  Any difficulty getting your medication? NO    DME available at Home: walker, cane, bed rails, shower chair, gait belt, wedges, w/c    Home O2 L Flow: n/a            Home O2 Provider: n/a    Home Environment and Prior Level of Function: Lives at [address]  [place] [address] @[phone]. Lives with spouse  How many stories is home? Two story  Steps into home: 0 steps  Responsibilities at home include: ADL    Prior to admission open services: none    Home Health Agency: n/a  Personal Care Agency: n/a    Extended Emergency Contact Information  Primary Emergency Contact: Lovina Browning  Mobile Phone: [phone]  Relation: Spouse  Secondary Emergency Contact: [place], Schroll  Mobile Phone:  [phone]  Relation: Sister     Transportation: Needs medical transport home    Therapy Recommendations:    OT = eval and treat    PT = eval and treat    SLP =  eval and treat     RT Home O2 Evaluation =  n/a    Wound Care =  n/a    Case Management Assessment    ABUSE/NEGLECT SCREENING                          PRIMARY DECISION MAKER    Self    Spouse Michelle Portoukalan                           CARE MANAGEMENT INTERVENTIONS   Readmission Interview Completed: Not Applicable   PCP Verified by CM: Yes Oscar Pouch)   Last Visit to PCP:  (unknown)   Palliative Care Criteria Met (RRAT>21 & CHF Dx)?: No   Mode of Transport at Discharge: BLS (needs medical transport at discharge)       Transition of Care Consult (CM Consult): SNF,Discharge Planning       Reason Outside Comfort Care Chosen: Patient declined ordered home care/hospice services       Discharge Durable Medical Equipment: No   Physical Therapy Consult: Yes   Occupational Therapy Consult: Yes   Speech Therapy Consult: Yes       Reason for Referral: DCP Rounds   History Provided By: Spouse,Medical Record   Patient Orientation: Person   Cognition: Short Term Memory Deficit   Support System Response: Concerned,Cooperative   Previous Living Arrangement: Lives with Family Dependent   Home Accessibility: Multi Level Home   Prior Functional Level: Assistance with the following:,Toileting,Mobility,Bathing,Dressing   Current Functional Level: Assistance with the following:,Toileting,Mobility,Bathing,Dressing       Can patient return to prior living arrangement: Yes   Ability to make needs known:: Poor   Family able to assist with home care needs:: Yes   Pets: Dog,Cat   Needs help with pets while hospitalized: No   Social Worker Referral: PASAAR   Types of Needs Identified: Disease Management Education,Treatment Education,Functions Dependency Needs,DME,ADLs/IADLs,Activity/Exercise   Anticipated Discharge Needs: Skilled Nursing Facility   Confirm Follow Up  Transport: Other (see comment) (needs medical transport at discharge)   Confirm Transport and Arrange: Yes  DISCHARGE LOCATION    SNF

## 2021-02-25 NOTE — ED Notes (Signed)
Report called to [facility], R.N. PT ready for transport to ICU once bed is ready. Nelva Bush or Pam, R.N. charge will call once bed is ready.

## 2021-02-25 NOTE — Progress Notes (Signed)
OCCUPATIONAL THERAPY EVALUATION     Patient: Thomas Jackson (50 y.o. 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  ADULT DIET Regular    Orders, labs, and chart reviewed. Communicated with nursing staff. Patient cleared to participate. Co-eval with PT to safely assess patient's functional status with OT focus on ADLs, PT focus on mobility     ASSESSMENT:      Patient agreeable to OT evaluation, co-eval with PT. AOx2, able to respond yes/no or gesture. Expressive aphasia. Able to self-feed with Set Up A. Min A bed mobility, CGA sit<>stand and transfer into bedside chair. CGA UB ADLs, Min A LB ADLs and toileting. Patient would benefit from acute skilled OT services to address functional deficits in self care prior to safe dc with support.        Modified Rankin Score (mRS): 3 - Moderate disability; requires some help but able to walk without (human) assistance - cane, walker etc.    PLAN :  Planned Interventions: Adaptive equipment, ADI training, activity tolerance, functional balance training, functional mobility training, therapeutic exercise, therapeutic activity, patient/caregiver education and training and neuromuscular re-education.  Frequency/Duration: Patient to be seen 1-5x/week x 2 weeks.  Discharge Recommendations: skilled nursing facility (SNF)  Further Equipment Recommendations for Discharge: no needs anticipated; has DME available at home     Patient and/or family have participated as able in goal setting and plan of care.    PRIOR LEVEL OF FUNCTION:     Information was obtained by:  patient, significant other  Home environment: Patient lives with significant other in a 2 story house with bedroom on 1st floor. threshold entry, walk-in  shower with shower chair and grab bar     Prior level of function:Patient is independent in basic ADLS.  Patient ambulates independently in the home, with a RW in the community.   Prior level of Instrumental Activities of Daily Living: Patient does not perform any IADLs.  Home equipment: shower chair, raised toilet seat, grab bars, rolling walker, cane, bed rail     EDUCATION:     Barriers to Learning/Limitations: yes;  cognitive and sensory deficits-vision/hearing/speech  Education provided: Patient, Spouse, Role of OT, Benefit of activity while hospitalized, Call for assistance, Out of bed 2-3 times/day, Staff assistance with mobility, Changes positions frequently, ADL training, Safety, Functional mobility, Demonstrates adequately and Verbalized understanding.  Educational Handouts issued: NONE.    SUBJECTIVE:   "I live close to the....."  --------------------------------------------------------------------------------------------------  Pain Assessment: None reported  Pain Location:  N/A    OBJECTIVE DATA SUMMARY:     Patient found Bed, Telemetry, ICU/stepdown equip, IV, Bed alarm and Family present.    Cognition     Mental Status: Oriented to, person and place.  Communication: impaired, aphasia and expressive.  Attention span: fair(15-62min).  Follows commands: impaired and 1 step.  General Cognition: patient presents with, slow processing and delayed responses.  Hearing: grossly intact.  Vision:  glasses.      Activities of Daily Living    Eating: supervision.  Grooming: supervision.  UB Bathing: contact guard.  LB Bathing: min. assist.  UB  Dressing: contact guard.  LB Dressing: min. assist  Toileting: min. assist.    ADL Comment:  Based on clinical assessment and direct observation. Able to pull up sock on R foot. Self-feeding with Set Up A.     Mobility    Supine to sit: min. assist.    Sit to Stand: contact guard.  Functional transfers: bed to chair with  contact guard.    Mobility Comment: step around  transfer to chair without AD    Functional Balance    Static Sitting Balance: fair+.  Dynamic Sitting Balance: fair.  Static Standing Balance: fair-.  Dynamic Standing Balance: fair-.  Activity Tolerance: good.    Upper Extremity Function    BUE ROM/strength [facility] for ADLs    Final Location: bedside chair, chair alarm, all needs close, agrees to call for assistance and nursing present.       Myrtis Ser Index of Independence in Activities of Daily Living    Activities  Points (1 or 0) Independence  (1 point)  NO supervision, direction, or personal assistance Dependence   (0 points)  WITH supervision, direction, personal assistance, or total care   Bathing []  (1 POINT) Bathes self completely or needs help in bathing only a single part of the body such as the back, genital area, or disabled extremity [x]  (0 POINTS) Need help with bathing more than one part of the body, getting in or out of the tub or showering. Requires total bathing.    Dressing []  (1 POINT) Get clothes from  closets and drawers and puts on clothes and outer garments complete with fasteners. May have help tying shoes.  [x]  (0 POINTS) Needs help with dressing self or needs to be completely dressed.    Toileting []  (1 POINT) Goes to toileting, gets on and off, arranges clothes, cleans genital area without help  [x]  Needs help transferring to the toileting, cleaning self or uses bedpan or commode    Transferring []  (1 POINT) Moves in and out of the bed or chair unassisted. Mechanical transfer aids are acceptable  [x] (0 POINTS) Needs help in moving from bed to chair or requires a complete transfer   Continence  []  (1 POINT) Exercises complete self control over urination and defecation  [x]  (0 POINTS) Is partially or totally incontinent of bowel or bladder   Feeding  [x]  (1 POINT) Gets food from plate into mouth without help. Preparation of food may be done by another person []  (0 POINTS) Needs partial or total help with feeding or requires parenteral feeding.      Total Score = 1           Scoring: 6 = High (patient independent) 0 = Low (patient very dependent)      By Christene Slates, PhD, APRN, BC, [facility] [employer], and Metro Kung, PhD, ARNP, [facility]      Occupational Therapy Goals:   OT goals initiated 02/25/2021 and will be met by patient within 10 sessions     1. Patient will complete UB dressing/bathing with Mod I to increase I in ADLs.  2. Patient will complete toilet transfer with Supervision to increase I in functional transfers.  3. Patient will complete LB dressing/bathing with SBA to improve ADL function.  4. Patient will stand x 5 minutes with Fair+ balance to complete grooming tasks.  5. Patient will complete toileting tasks with Supervision to improve ADL function.   _______________________________________________________________________    Thank you for this referral.  Charisse Klinefelter, OTR/L, CNS  Certified Neuro Specialist

## 2021-02-25 NOTE — Progress Notes (Signed)
 Progress Notes by Lavella Nat BROCKS, PT at 02/25/21 1609                Author: Lavella Nat BROCKS, PT  Service: Physical Therapy  Author Type: Physical Therapist       Filed: 02/25/21 1618  Date of Service: 02/25/21 1609  Status: Signed          Editor: Lavella Nat BROCKS, PT (Physical Therapist)                  PHYSICAL THERAPY EVALUATION:                 Patient: Thomas Jackson  (50 y.o. 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 Aphasia.          Isolation:   There are currently no Active Isolations       MDRO: No active infections        Equipment: gait belt         Assessment:         Based on the objective data described below, the patient presents with   - ICU Mobility Level:  6 - Marching on spot at bedside   - Modified Rankin Score (mRS): 3 - Moderate disability; requires some help but able to walk without (human) assistance - cane, walker etc.    - decreased mobility affecting function   - decreased independence with functional mobility   - decreased tolerance to sustained activity   - active participation in mobility assessment, mobility training, therapeutic activities, and patient education   - min A bed mobility with hand held assist   - CGA transfer to bedside chair   - expressive aphasia     - motivated to participate in PT to improve functional activity tolerance and return to prior level of function      Functional Status Score for the Intensive Care Unit (FSS-ICU)      Task   Score     1.  Rolling  5 - Requires cues or coaxing, but can perform physically without assistance     2. Supine to Sit Transfer  4 - Min assist (patient performs 75% or more of the work)     3. Sit to Stand Transfer   6 - Use of bed rail, armrests, or another object to pull on for support     4. Sitting Edge of Bed  6 - Requires hands to balance        5. Walking  1 - Walks <50 feet with the assistance of 1 person OR requires the assistance of 2 people to assist with ambulation of any distance        TOTAL SCORE:  22/35        INITIAL TOTAL SCORE:                Patients rehabilitation potential for below stated goals: Good.        Recommendations:   Recommend continued physical therapy during  acute stay. Occupational Therapy and Speech  Therapy. Recommend out of bed activity to counteract ill effects of bedrest, with assistance from staff as needed.   Discharge Recommendations: Skilled nursing facility (SNF) vs. Home with home health: patient will benefit from further therapy at rehab facility  to increase strength and endurance to return to prior level of function.   Further Equipment Recommendations for Discharge: DME needs to be determined at time  of d/c from rehab.          Plan:         Patient will benefit from skilled Physical Therapy intervention to address the above impairments to return to prior level of function. Patient will be seen 3-5 times/week for at least 1 week.      Patient will achieve PT goals in 1 weeks. Goals were created with input from the  patient.        Physical Therapy Goals:        - Patient will be modified independent with bed mobility and will be modified independent  with transfers in preparation for OOB activities and ambulation.   - Patient will tolerate sitting up in chair for meals.   - Patient will ambulate with supervision/set-up for 50 feet with the least restrictive device to promote functional independence at home.    - Patient will demonstrate good balance to safely enable upright activities and reduce risk for falls.   - Patient will demonstrate good activity tolerance during functional activities.   - Patient will demonstrate good safety awareness during functional activities.   -  Patient will improve FSS-ICU score to at least 26/35 in order to indicate improved functional independence.   - Patient will be standby assist with lower extremity home exercise program to increase strength and endurance.   - Patient will utilize energy conservation techniques during functional activities.        Planned interventions:         Skilled Physical Therapy services will provide functional mobility training, therapeutic exercises, therapeutic activities, patient/caregiver education as indicated.   Skilled Physical Therapy services will modify and progress therapeutic interventions, address functional mobility deficits, address ROM and strength deficits, analyze and cue movement  patterns and assess and modify postural abnormalities to reach the stated goals.        Subjective:         Patient agreeable to PT/OT co-evaluation to maximize safety and mobility. PT emphasis on balance and mobility and OT focus on ADLs. Patient pleasant and cooperative with therapy. Limited by expressive aphasia, but uses yes/no consistently to answer simple  questions. Nice! response to being up in the bedside chair.    Patient reports 0/10 pain before treatment and 0/10 pain at conclusion of treatment.        Objective Data Summary:         Orders, labs, and chart reviewed on Bel[facility] Remington. Communicated with patient's nurse  (patient ok to be seen by PT).       Present illness history:      Patient Active Problem List           Diagnosis  Date Noted         ?  Thalamic hemorrhage (HCC)  02/24/2021         Previous medical history: No past medical history on file.        Prior Level of Function/Home Situation:    Information was obtained by patient and patient's significant other at bedside  Home environment: Patient lives with significant other in a 2 story house with bedroom on 1st floor. threshold entry,  walk-in shower with shower chair and grab bar      Prior level of function:Patient is independent in basic ADLS.   Patient ambulates independently in the home, with a RW in the community.    Home equipment: shower chair, raised toilet seat, grab bars, rolling walker, cane, bed rail         Patient found:        Bed, (+) bed/chair exit alarm, (+) IV, (+) ICU/step down equipment, (+) LE compression therapy (SCDs), (+) visitor: significant other   Most recent value for oxygen in flow sheets:   O2 Device: None (Room air) (02/25/21 0400)          Patient received/participated in 15 minutes of treatment (functional mobility training, bed mobility training, transfer training, therapeutic  activities, patient education) during/immediately following PT evaluation.        Cognitive Status:        Mental Status: Alert and participating, true assessment limited by aphasia   Communication: limited verbalization, expressive aphasia   Follows commands: follows simple commands but requires redirection for compliance  with previous directions, requires cues to stay on task   General Cognition: mildly impaired   Safety/Judgement: needs cueing for safety and precautions        Extremities Assessment:         Sensation:   Intact to light touch BLE      Strength:     WFL, grossly 4+/5 BLE      Range Of Motion:   [facility] BLE        Therapeutic Activities; Functional Mobility and Balance Status:         Focus on functional mobility training, bed mobility training, transfer training, therapeutic activities, patient education. Provided cueing for  hand placement, technique, and safety.          Bed Mobility:      Functional Status     Comments         Supine to sit  min assist/1 person assist, verbal cues, set-up, requires additional time, use of bed rails, hand held assist       Sit to supine  not tested, transferred to bedside chair       Rolling  contact guard/min assist/1 person assist, verbal cues, tactile cues, set-up, requires additional time, use of bed rails              Transfers:      Functional Status     Comments         Sit to stand  contact  guard assist/1 person assist, verbal cues, set-up, assistive device:gait belt       Stand to sit  contact guard assist/1 person assist, verbal cues, set-up, assistive device:gait belt           bed to bedside chair  contact guard assist/1 person assist, verbal cues, set-up, assistive device:gait belt              Ambulation/Gait Training:           Distance   5 feet      Analysis   step to gait pattern, decreased walking speed, shuffled gait, guarded, displays mild LOB when challenged  with distraction, motivated to increase distance        Assistance   contact guard/min assist/1 person assist, verbal cues, tactile cues, set-up, requires additional  time and cueing for sequencing, technique, obstacle avoidance, safety and safety with turns         DME   gait belt           Stair Training   not tested            Functional Balance               Static sitting    Good static: patient able to maintain  balance without handhold support, limited postural sway      Dynamic sitting    Fair dynamic: patient accepts minimal challenge; able to maintain balance while turning head/trunk      Static standing    fair (accepts min challenge)         Dynamic standing    fair (accepts min challenge)          Therapeutic Exercises:            Therapeutic Exercises   performed a few reps of each, AROM BLE, needs verbal cues to stay on task:    ankle pumps, heel slides in supine, hip ab/duction in supine, long arc quads (LAQ), straight leg raise, seated marches, standing marches           Balance   Activities/   Neuromuscular Re-Education   Worked to improve reactive postural responses: stable surface,  sitting edge of bed, weight shift, bilaterally, reaching outside of base of support, reaching high/low, emphasis on sitting and standing balance in order to actively participate in functional/self-care tasks .     Required CG for balance support/correction and verbal/tactile/visual cues for safety and technique.           Activity  Tolerance:        - requires increased time, rest breaks and repeated attempts with functional tasks   - motivated to increase activity   - generalized fatigue   - generalized muscle weakness   - needs increased time to adjust to change in position   - relieved with time to adjust to change in position   - vitals stable   - no apparent distress        Final Location:        Seated in bed side chair, all needs within reach. Patient agrees to call for assistance. Positioned with pillows for comfort. BLE elevated. Patient set up with tray. (+) chair alarm. (+) nurse notified.  (+) nurse present. Patient watching tv. Patient is glad to be OOB.        Communication/Education:        Education: benefits of activity, OOB with assist from staff, OOB activity as tolerated, OOB to chair for meals and mobilize as tolerated,  activity as tolerated to counteract ill effects of bedrest, promote healing and return to prior level of function, call staff for assistance, safety, positioning, DME, disposition, role of PT, PT plan of care, all questions answered .      Education provided to: patient    Opportunity for questions and clarification was provided.      Readiness to learn indicated by: trying to perform skills, showing interest and needs reinforcement      Barriers to learning/limitations:  Yes;  sensory deficits: vision/hearing/speech      Comprehension: In agreement         Thank you for this referral.   Nat JAYSON Friedman, PT, DPT

## 2021-02-25 NOTE — Progress Notes (Signed)
Progress Note    Patient: Thomas Jackson   Age:  50 y.o.  DOA: 02/24/2021   Admit Dx / CC: LOS:  LOS: 1 day     Active Problems   1.  Acute metabolic encephalopathy multifactorial  2.  Acute right thalamic hemorrhage  3.  Hypertension  4.  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  CKD stage IV  6.  Vitamin D deficiency  7.  Seizure disorder  8.  Hyperlipidemia  9.  History of previous stroke  Plan:   1.  Patient was seen and examined patient was feeling fine it was difficult to obtain  Any reasonable history from the patient  He is able to tell his name but he does not know why he is in the hospital he does not know anything about his medical problems.  CT of the head was repeated this morning which showed stable right thalamic hemorrhage with old temporoparietal infarct and encephalomalacia  2.  Speech therapy consult was done.  Patient be started on appropriate diet  3.  Once patient starts eating drinking he can be started on oral antihypertensive medications and Cardene drip can be weaned off  4.  For seizure disorder patient will be continued on Keppra  5.  Patient was discussed with RN in detail    Subjective:   Unable to obtain    Objective:     Visit Vitals  BP (!) 153/73   Pulse 70   Temp 98.4 ??F (36.9 ??C)   Resp 17   Ht 5' 4"  (1.626 m)   Wt 124.1 kg (273 lb 9.6 oz)   SpO2 97%   BMI 46.96 kg/m??       Physical Exam:  General appearance: Middle-age male well-built sitting comfortably no acute distress  Head: Conjunctive are pink, pupils reactive  Neck: supple, no bruit no neck stiffness  Lungs: CTA bilaterally  Heart: Regular rhythm, no murmur  Abdomen: soft, distended, non-tender. Bowel sounds Audible,  Extremities: No edema, distal pulses palpable  Skin: Warm and dry  Neurologic: Awake and alert  Following commands  Intake and Output:  Current Shift:  07/19 0701 - 07/19 1900  In: 912.5 [I.V.:912.5]  Out: 850 [Urine:850]  Last three shifts:  07/17 1901 - 07/19 0700  In: 1540.4  [I.V.:1540.4]  Out: 160 [Urine:160]    Lab/Data Reviewed:  Recent Results (from the past 48 hour(s))   GLUCOSE, POC    Collection Time: 02/24/21  6:10 PM   Result Value Ref Range    Glucose (POC) 116 (H) 65 - 105 mg/dL   CBC WITH AUTOMATED DIFF    Collection Time: 02/24/21  6:18 PM   Result Value Ref Range    WBC 13.8 (H) 4.0 - 11.0 1000/mm3    RBC 3.97 3.80 - 5.70 M/uL    HGB 12.8 12.4 - 17.2 gm/dl    HCT 37.2 37.0 - 50.0 %    MCV 93.7 80.0 - 98.0 fL    MCH 32.2 23.0 - 34.6 pg    MCHC 34.4 30.0 - 36.0 gm/dl    PLATELET 383 140 - 450 1000/mm3    MPV 8.9 6.0 - 10.0 fL    RDW-SD 43.5 35.1 - 43.9      NRBC 0 0 - 0      IMMATURE GRANULOCYTES 0.3 0.0 - 3.0 %    NEUTROPHILS 74.5 (H) 34 - 64 %    LYMPHOCYTES 17.9 (L) 28 - 48 %  MONOCYTES 4.8 1 - 13 %    EOSINOPHILS 1.9 0 - 5 %    BASOPHILS 0.6 0 - 3 %   METABOLIC PANEL, COMPREHENSIVE    Collection Time: 02/24/21  6:18 PM   Result Value Ref Range    Potassium 4.9 (H) 3.4 - 4.5 mEq/L    Chloride 106 98 - 107 mEq/L    Sodium 137 136 - 145 mEq/L    CO2 23 20 - 31 mEq/L    Glucose 120 (H) 74 - 106 mg/dl    BUN 41 (H) 9 - 23 mg/dl    Creatinine 3.51 (H) 0.70 - 1.30 mg/dl    GFR est AA 24.0      GFR est non-AA 20      Calcium 8.8 8.7 - 10.4 mg/dl    Anion gap 8 5 - 15 mmol/L    AST (SGOT) 18.0 0.0 - 33.9 U/L    ALT (SGPT) 25 10 - 49 U/L    Alk. phosphatase 109 46 - 116 U/L    Bilirubin, total 0.50 0.30 - 1.20 mg/dl    Protein, total 6.4 5.7 - 8.2 gm/dl    Albumin 3.1 (L) 3.4 - 5.0 gm/dl   PROTHROMBIN TIME + INR    Collection Time: 02/24/21  6:18 PM   Result Value Ref Range    Prothrombin time 12.1 10.2 - 12.9 seconds    INR 1.0 0.1 - 1.1     GLUCOSE, POC    Collection Time: 02/25/21  2:21 AM   Result Value Ref Range    Glucose (POC) 107 (H) 65 - 105 mg/dL   CBC WITH AUTOMATED DIFF    Collection Time: 02/25/21  3:58 AM   Result Value Ref Range    WBC 14.8 (H) 4.0 - 11.0 1000/mm3    RBC 3.89 3.80 - 5.70 M/uL    HGB 12.5 12.4 - 17.2 gm/dl    HCT 37.0 37.0 - 50.0 %    MCV 95.1 80.0  - 98.0 fL    MCH 32.1 23.0 - 34.6 pg    MCHC 33.8 30.0 - 36.0 gm/dl    PLATELET 384 140 - 450 1000/mm3    MPV 9.1 6.0 - 10.0 fL    RDW-SD 42.5 35.1 - 43.9      NRBC 0 0 - 0      IMMATURE GRANULOCYTES 0.3 0.0 - 3.0 %    NEUTROPHILS 67.8 (H) 34 - 64 %    LYMPHOCYTES 23.2 (L) 28 - 48 %    MONOCYTES 7.1 1 - 13 %    EOSINOPHILS 1.1 0 - 5 %    BASOPHILS 0.5 0 - 3 %   METABOLIC PANEL, COMPREHENSIVE    Collection Time: 02/25/21  3:58 AM   Result Value Ref Range    Potassium 5.0 (H) 3.4 - 4.5 mEq/L    Chloride 108 (H) 98 - 107 mEq/L    Sodium 141 136 - 145 mEq/L    CO2 22 20 - 31 mEq/L    Glucose 110 (H) 74 - 106 mg/dl    BUN 44 (H) 9 - 23 mg/dl    Creatinine 3.53 (H) 0.70 - 1.30 mg/dl    GFR est AA 24.0      GFR est non-AA 20      Calcium 8.6 (L) 8.7 - 10.4 mg/dl    Anion gap 11 5 - 15 mmol/L    AST (SGOT) 15.0 0.0 - 33.9 U/L    ALT (  SGPT) 26 10 - 49 U/L    Alk. phosphatase 109 46 - 116 U/L    Bilirubin, total 0.50 0.30 - 1.20 mg/dl    Protein, total 5.7 5.7 - 8.2 gm/dl    Albumin 2.9 (L) 3.4 - 5.0 gm/dl   HEMOGLOBIN A1C W/O EAG    Collection Time: 02/25/21  3:58 AM   Result Value Ref Range    Hemoglobin A1c 6.1 (H) 3.8 - 5.6 %   LIPID PANEL    Collection Time: 02/25/21  3:58 AM   Result Value Ref Range    Cholesterol, total 134 0 - 199 mg/dl    HDL Cholesterol 34 (L) 40 - 60 mg/dl    Triglyceride 181 (H) 0 - 150 mg/dl    LDL, calculated 64 0 - 130 mg/dl    CHOL/HDL Ratio 3.9 0.0 - 5.0 Ratio   MAGNESIUM    Collection Time: 02/25/21  3:58 AM   Result Value Ref Range    Magnesium 1.7 1.6 - 2.6 mg/dL   PHOSPHORUS    Collection Time: 02/25/21  3:58 AM   Result Value Ref Range    Phosphorus 5.0 2.4 - 5.1 mg/dL   TSH 3RD GENERATION    Collection Time: 02/25/21  3:58 AM   Result Value Ref Range    TSH 2.782 0.550 - 4.780 uIU/mL   VITAMIN B12    Collection Time: 02/25/21  3:58 AM   Result Value Ref Range    Vitamin B12 377 211 - 911 pg/ml   GLUCOSE, POC    Collection Time: 02/25/21  5:54 AM   Result Value Ref Range    Glucose (POC)  104 65 - 105 mg/dL   GLUCOSE, POC    Collection Time: 02/25/21  8:32 AM   Result Value Ref Range    Glucose (POC) 118 (H) 65 - 105 mg/dL   GLUCOSE, POC    Collection Time: 02/25/21 12:22 PM   Result Value Ref Range    Glucose (POC) 115 (H) 65 - 462 mg/dL   METABOLIC PANEL, BASIC    Collection Time: 02/25/21 12:42 PM   Result Value Ref Range    Potassium 4.8 (H) 3.4 - 4.5 mEq/L    Chloride 109 (H) 98 - 107 mEq/L    Sodium 140 136 - 145 mEq/L    CO2 22 20 - 31 mEq/L    Glucose 108 (H) 74 - 106 mg/dl    BUN 38 (H) 9 - 23 mg/dl    Creatinine 3.50 (H) 0.70 - 1.30 mg/dl    GFR est AA 24.0      GFR est non-AA 20      Calcium 8.2 (L) 8.7 - 10.4 mg/dl    Anion gap 9 5 - 15 mmol/L   GLUCOSE, POC    Collection Time: 02/25/21  5:43 PM   Result Value Ref Range    Glucose (POC) 162 (H) 65 - 105 mg/dL       Imaging:  CT HEAD WO CONT    Result Date: 02/25/2021  INDICATION: icb. COMPARISON: 02/24/2021. TECHNIQUE: Axial Head CT without IV contrast. Sagittal and coronal reconstructions were performed. All CT exams at this facility use one or more dose reduction techniques including automatic exposure control, mA/kV adjustment per patient's size, or iterative reconstruction technique. FINDINGS: Again noted is focal hemorrhage centered in the right basal ganglia extending into the right lateral and third ventricles. The overall size of the hemorrhage measures 2.2 x 1.5 cm, unchanged. Ventricles and  sulci are normal in size and configuration. Encephalomalacia/gliosis from remote infarct in the left posterior frontal, parietal, temporal, and occipital lobes. Encephalomalacia from remote infarct in the left basal ganglia. No CT evidence of acute large territorial infarction. Imaged portions of the paranasal sinuses and mastoid air cells are well aerated. Calvarium is unremarkable.     IMPRESSION: 1.  Stable hemorrhage in the right basal ganglia extending to the right lateral ventricle and third ventricle. 2.  Sequelae of remote left MCA  territory infarction.       Medications Reviewed:  Current Facility-Administered Medications   Medication Dose Route Frequency   ??? amLODIPine (NORVASC) tablet 10 mg  10 mg Oral DAILY   ??? metoprolol tartrate (LOPRESSOR) tablet 25 mg  25 mg Oral BID   ??? dextrose (D50W) injection syrg 5-25 g  10-50 mL IntraVENous PRN   ??? glucagon (GLUCAGEN) injection 1 mg  1 mg IntraMUSCular PRN   ??? insulin glargine (LANTUS) injection 1-100 Units  1-100 Units SubCUTAneous QHS   ??? insulin glargine (LANTUS) injection 1-100 Units  1-100 Units SubCUTAneous ONCE PRN   ??? insulin lispro (HUMALOG) injection 1-100 Units  1-100 Units SubCUTAneous AC&HS   ??? insulin lispro (HUMALOG) injection 1-100 Units  1-100 Units SubCUTAneous PRN   ??? naloxone (NARCAN) injection 0.1 mg  0.1 mg IntraVENous PRN   ??? 0.9% sodium chloride infusion  100 mL/hr IntraVENous CONTINUOUS   ??? ondansetron (ZOFRAN) injection 4 mg  4 mg IntraVENous Q4H PRN   ??? rosuvastatin (CRESTOR) tablet 10 mg  10 mg Oral QHS   ??? levETIRAcetam (KEPPRA) 500 mg in saline (iso-osm) 100 mL IVPB (premix)  500 mg IntraVENous Q12H   ??? pantoprazole (PROTONIX) 40 mg in 0.9% sodium chloride 10 mL injection  40 mg IntraVENous DAILY   ??? niCARdipine in Saline (CARDENE) 40 mg/200 mL (0.2 mg/mL) 200 ml premix infusion  0-15 mg/hr IntraVENous TITRATE         Dragon medical dictation software was used for portions of this report. Unintended errors may occur.   Elon Spanner, MD  P# [phone]  February 25, 2021

## 2021-02-25 NOTE — Progress Notes (Signed)
[facility]    ICU Daily Progress Note    Patient: Thomas Jackson               Sex: male          DOA: 02/24/2021  Date of Birth:  September 19, 1970       Age:  50 y.o.        LOS:  LOS: 1 day        Code Status: Full Code        [x] I have reviewed the flowsheet and previous day???s notes. Events, vitals, medications and notes from last 24 hours reviewed.   IMPRESSION   50 y.o.m with PMHx DM2 with insulin dependence, seizures, HTN, CAD and previous intracranial hemorrhage in 2015 and again January 2022. Evaluated at [facility] after family felt his mentation had been off for 4 days with multiple falls, CT head indicating ICH. Transferred to [facility] for neurosurgery consultation. Remains in ICU due to BP constraints on Cardene gtt.       Interval HISTORY     Interval History/Past 24 Hour Events:  Transfer to ICU. NIH 7 7/19  ASSESSMENT:   Active problem list:  - Acute right thalamic hemorrhage with new minimal extension into the third ventricle on CT 02/24/2021 at [facility].  This is increased since previously visualized hemorrhage on August 15, 2020. Last NIH 7. No intervention per Neurosurgery.  stable on CT head 02/25/2021.  - Acute on chronic kidney disease. Baseline creatinine 3 in 09/2020. Cr 3.51 > 3.53  - Seizure disorder  - Diabetes  - hyperkalemia 5.0.   - Hypertension: uncontrolled on Cardene. Suspect Noncomplaince with meds.  - History of coronary artery disease  - Hypothyroidism following partial thyroidectomy  - Hyperlipidemia   -History of stroke in 2013 with right-sided deficits  - History of intracranial hemorrhage January 2022 seen at [facility] in [place]:    - Currently on room air. Supplemental PRN  to keep SpO2 >92%.  -Cardene as needed to maintain systolic blood pressure less than 140. Hopeful to wean off.   - passed speech evaluation, cleared for regular diet. Start 26m Norvasc and 226mMetoprolol BID.    -Aspiration precautions  - Keppra twice daily  - Home medications per hospitalist  - NIH and neurochecks per ICU protocol  - continue IVFs with AKI on CKD- daily BMP  -Avoid nephrotoxins. Renally dose medications. Monitor urine output. Daily weights  -Electrolyte replacements as needed.   - Glucommander per protocol  -SLP, OT and PT consults    SUP: protonix  DVT prophylaxis: SCDs     My assessment, plan of care, findings, medications, side effects etc discussed with nursing staff.       [x] Total critical care time exclusive of procedures 31 minutes with complex decision making performed and > 50% time spent in face to face consultation.                 ROS     Review of Systems   Constitutional: Negative.    HENT: Negative.    Respiratory: Negative.    Cardiovascular: Negative.    Gastrointestinal: Negative.    Genitourinary: Negative.    Neurological: Positive for speech difficulty and weakness.         Physical Exam       GENERAL: Awake, alert, comfortable.  Aphasic with nonsensical response  HEENT: Oral mucosa moist. No thrush.  NECK: Supple. Trachea midline.   RESPIRATORY: Bilateral BS present, decreased at bases. No rales or rhonchi.   CARDIOVASCULAR: S1 and S2 present. No murmur, rub, or thrill.   ABDOMEN: Soft and nontender with positive bowel sounds. No organomegaly.   MUSCULOSKELETAL: No joint effusions or joint tenderness.   SKIN: No rash. Skin is warm, dry, and intact.   NEUROLOGICAL: Conscious, aphasic moving all 4 extremities with no obvious deficit.        Recent Labs     02/25/21  0358 02/24/21  1818   WBC 14.8* 13.8*   HCT 37.0 37.2   BUN 44* 41*   NA 141 137       Recent Results (from the past 24 hour(s))   GLUCOSE, POC    Collection Time: 02/24/21  6:10 PM   Result Value Ref Range    Glucose (POC) 116 (H) 65 - 105 mg/dL   CBC WITH AUTOMATED DIFF    Collection Time: 02/24/21  6:18 PM   Result Value Ref Range    WBC 13.8 (H) 4.0 - 11.0 1000/mm3    RBC 3.97 3.80 - 5.70 M/uL    HGB 12.8 12.4 - 17.2  gm/dl    HCT 37.2 37.0 - 50.0 %    MCV 93.7 80.0 - 98.0 fL    MCH 32.2 23.0 - 34.6 pg    MCHC 34.4 30.0 - 36.0 gm/dl    PLATELET 383 140 - 450 1000/mm3    MPV 8.9 6.0 - 10.0 fL    RDW-SD 43.5 35.1 - 43.9      NRBC 0 0 - 0      IMMATURE GRANULOCYTES 0.3 0.0 - 3.0 %    NEUTROPHILS 74.5 (H) 34 - 64 %    LYMPHOCYTES 17.9 (L) 28 - 48 %    MONOCYTES 4.8 1 - 13 %    EOSINOPHILS 1.9 0 - 5 %    BASOPHILS 0.6 0 - 3 %   METABOLIC PANEL, COMPREHENSIVE    Collection Time: 02/24/21  6:18 PM   Result Value Ref Range    Potassium 4.9 (H) 3.4 - 4.5 mEq/L    Chloride 106 98 - 107 mEq/L    Sodium 137 136 - 145 mEq/L    CO2 23 20 - 31 mEq/L    Glucose 120 (H) 74 - 106 mg/dl    BUN 41 (H) 9 - 23 mg/dl    Creatinine 3.51 (H) 0.70 - 1.30 mg/dl    GFR est AA 24.0      GFR est non-AA 20      Calcium 8.8 8.7 - 10.4 mg/dl    Anion gap 8 5 - 15 mmol/L    AST (SGOT) 18.0 0.0 - 33.9 U/L    ALT (SGPT) 25 10 - 49 U/L    Alk. phosphatase 109 46 - 116 U/L    Bilirubin, total 0.50 0.30 - 1.20 mg/dl    Protein, total 6.4 5.7 - 8.2 gm/dl    Albumin 3.1 (L) 3.4 - 5.0 gm/dl   PROTHROMBIN TIME + INR    Collection Time: 02/24/21  6:18 PM   Result Value Ref Range    Prothrombin time 12.1 10.2 - 12.9 seconds    INR 1.0 0.1 - 1.1     GLUCOSE, POC    Collection Time: 02/25/21  2:21 AM   Result Value Ref Range    Glucose (POC) 107 (H) 65 - 105 mg/dL   CBC WITH AUTOMATED DIFF  Collection Time: 02/25/21  3:58 AM   Result Value Ref Range    WBC 14.8 (H) 4.0 - 11.0 1000/mm3    RBC 3.89 3.80 - 5.70 M/uL    HGB 12.5 12.4 - 17.2 gm/dl    HCT 37.0 37.0 - 50.0 %    MCV 95.1 80.0 - 98.0 fL    MCH 32.1 23.0 - 34.6 pg    MCHC 33.8 30.0 - 36.0 gm/dl    PLATELET 384 140 - 450 1000/mm3    MPV 9.1 6.0 - 10.0 fL    RDW-SD 42.5 35.1 - 43.9      NRBC 0 0 - 0      IMMATURE GRANULOCYTES 0.3 0.0 - 3.0 %    NEUTROPHILS 67.8 (H) 34 - 64 %    LYMPHOCYTES 23.2 (L) 28 - 48 %    MONOCYTES 7.1 1 - 13 %    EOSINOPHILS 1.1 0 - 5 %    BASOPHILS 0.5 0 - 3 %   METABOLIC PANEL, COMPREHENSIVE     Collection Time: 02/25/21  3:58 AM   Result Value Ref Range    Potassium 5.0 (H) 3.4 - 4.5 mEq/L    Chloride 108 (H) 98 - 107 mEq/L    Sodium 141 136 - 145 mEq/L    CO2 22 20 - 31 mEq/L    Glucose 110 (H) 74 - 106 mg/dl    BUN 44 (H) 9 - 23 mg/dl    Creatinine 3.53 (H) 0.70 - 1.30 mg/dl    GFR est AA 24.0      GFR est non-AA 20      Calcium 8.6 (L) 8.7 - 10.4 mg/dl    Anion gap 11 5 - 15 mmol/L    AST (SGOT) 15.0 0.0 - 33.9 U/L    ALT (SGPT) 26 10 - 49 U/L    Alk. phosphatase 109 46 - 116 U/L    Bilirubin, total 0.50 0.30 - 1.20 mg/dl    Protein, total 5.7 5.7 - 8.2 gm/dl    Albumin 2.9 (L) 3.4 - 5.0 gm/dl   HEMOGLOBIN A1C W/O EAG    Collection Time: 02/25/21  3:58 AM   Result Value Ref Range    Hemoglobin A1c 6.1 (H) 3.8 - 5.6 %   LIPID PANEL    Collection Time: 02/25/21  3:58 AM   Result Value Ref Range    Cholesterol, total 134 0 - 199 mg/dl    HDL Cholesterol 34 (L) 40 - 60 mg/dl    Triglyceride 181 (H) 0 - 150 mg/dl    LDL, calculated 64 0 - 130 mg/dl    CHOL/HDL Ratio 3.9 0.0 - 5.0 Ratio   MAGNESIUM    Collection Time: 02/25/21  3:58 AM   Result Value Ref Range    Magnesium 1.7 1.6 - 2.6 mg/dL   PHOSPHORUS    Collection Time: 02/25/21  3:58 AM   Result Value Ref Range    Phosphorus 5.0 2.4 - 5.1 mg/dL   TSH 3RD GENERATION    Collection Time: 02/25/21  3:58 AM   Result Value Ref Range    TSH 2.782 0.550 - 4.780 uIU/mL   VITAMIN B12    Collection Time: 02/25/21  3:58 AM   Result Value Ref Range    Vitamin B12 377 211 - 911 pg/ml   GLUCOSE, POC    Collection Time: 02/25/21  5:54 AM   Result Value Ref Range    Glucose (POC) 104 65 - 105  mg/dL   GLUCOSE, POC    Collection Time: 02/25/21  8:32 AM   Result Value Ref Range    Glucose (POC) 118 (H) 65 - 105 mg/dL           No results for input(s): FIO2I, IFO2, HCO3I, IHCO3, HCOPOC, PCO2I, PCOPOC, IPHI, PHI, PHPOC, PO2I, PO2POC in the last 72 hours.    No lab exists for component: IPOC2    Imaging:  Personally reviewed.         Charlesetta Ivory, PA-C  Pulmonary Critical  Care Medicine  [facility]   February 25, 2021 11:33 AM       I have reviewed the patient's chart. I saw and examined the patient, personally performed the critical or key portions of the service and discussed the management with the treatment team. I reviewed the NP's note and concur with the history, findings and plan of care, which include's my edits and my assessment and plan. Laboratory, hemodynamic and radiological data were reviewed.      Murriel Hopper MD

## 2021-02-25 NOTE — Progress Notes (Signed)
 SPEECH LANGUAGE PATHOLOGY   BEDSIDE DYSPHAGIA AND SPEECH- LANGUAGE EVALUATIONS     Patient: Thomas Jackson (50 y.o. male) [birth year]  Room: I204/I204  Primary Diagnosis: Thalamic hemorrhage (HCC) [I61.0]        PMH: No past medical history on file.    Isolation:  There are currently no Active Isolations       MDRO: No current active infections  PPE: surgical mask, gloves, goggles  Precautions:  universal, aspiration and fall    Date: 02/25/2021   Start Time:  824 End Time:  847 Total Time: 23 minutes   Dysphagia Evaluation: 13 minutes    Speech- Language Evaluation: 10 minutes      ASSESSMENTS:   Orientation: Pt awake, alert, confused    Dysphagia Evaluation: Bedside swallowing evaluation completed at bedside. Patient with cough x1 with large sequential sips of thin liquids out of multiple trials. Patient needed max cues to take small sips. Patient unable to follow commands to assess oral motor function. Patient consumed solids without any overt clinical signs or symptoms suggestive of penetration or aspiration. Recommend IDDSI-7 Regular diet with IDDSI-0 Thin liquids as tolerated. Meds as tolerated. Strict aspiration precautions with supervision for PO. POC discussed with patient, RN, PA Robynn who gave orders to place diet. SLP will follow accordingly.   Functional Oral Intake Scale: Level 7: Total oral intake without restrictions    Speech- Language Evaluation:  Speech- language eval completed at bedside. Per chart review patient with severe expressive and receptive language deficits at baseline per outpatient speech therapy notes in 09/2020 but unsure what current baseline is. Patient able to state name but unable to state last name, place, situation, month, or year. Patient able to imitate simple 1 step commands with 20% accuracy. Patient unable to complete automatic speech routines, imitate counting, or label objects around the room. Patient would benefit from SLP services while in house. SLP will follow  accordingly.   Aphasia Severity Rating: ASR 1- The individual may occasionally produce words or phrases that are meaningful in context, but communication is fragmentary and not possible without significant help from the listener, e.g. guessing, questioning.  [place] is very 'one sided' with the listener bearing almost all of the burden.  An extremely limited amount or range of information is exchanged.  Misunderstandings or failed communications are frequent    PLAN/ RECOMMENDATIONS:                 Diet Recommendations: initiate IDDSI 7- regular, IDDSI 0- thin   Compensatory Swallow Strategies: small bites/ sip, slow rate of eating/ feeding, alternate solids/ liquids, meticulous oral care at least 3x/ day, basic compensatory swallow strategies   Medication Administration: as tolerated   Aspiration Precautions: supervision for PO intake  Risk(s) for Aspiration: mental ability/ status, acute CVA, brain lesions, head injury, h/o CVA, brain lesions, head injury     Therapy Recommendations: pt would benefit from initiation of SLP for dysphagia, speech- language skills 3-5x/ week to address goals per POC   Therapy Prognosis: fair   Discharge Recommendations: to be determined  Other Recommended Services: OT, PT, Dietitian     SUBJECTIVE:    Patient Stated: Thomas Jackson.   Pain Prior to Treatment: no pain reported   Pain Following Treatment: no pain reported     Diet Prior to Admission: IDDSI 7- regular, IDDSI 0- thin  Current Diet: NPO     OBJECTIVE:   Position: HOB ~60*  Oral Motor Assessment: Unable to follow  Dentition: natural  Voice:  WFL  Consistencies: thin via straw, regular   Oral Phase: WFL  Pharyngeal Phase: immediate cough following thin  Esophageal Phase: wfl for b/s swallow eval    Receptive/ Expressive Language Skills:    See care plan    Safety: Following session, pt in bed, lowest position, HOB 60*, 4 side rails up, bed alarm on  Modified Rankin Score (MRS): Defer to OT/ PT    EDUCATION:   Education  Provided: POC, deficits and diet recommendations  Individual Educated: patient  Comprehension: query pt's level carry over   Staff Education: Educated Nurse to POC, deficits and diet recommendations.      Thank you for this referral,  Camie Pickles MA-CCC-SLP  Speech Pathologist   Office [phone]

## 2021-02-26 LAB — COMPREHENSIVE METABOLIC PANEL
ALT: 19 U/L (ref 10–49)
AST: 16 U/L (ref 0.0–33.9)
Albumin: 3 gm/dl — ABNORMAL LOW (ref 3.4–5.0)
Alkaline Phosphatase: 102 U/L (ref 46–116)
Anion Gap: 8 mmol/L (ref 5–15)
BUN: 43 mg/dl — ABNORMAL HIGH (ref 9–23)
CO2: 23 mEq/L (ref 20–31)
Calcium: 8.7 mg/dl (ref 8.7–10.4)
Chloride: 108 mEq/L — ABNORMAL HIGH (ref 98–107)
Creatinine: 3.8 mg/dl — ABNORMAL HIGH (ref 0.70–1.30)
EGFR IF NonAfrican American: 18
GFR African American: 22
Glucose: 101 mg/dl (ref 74–106)
Potassium: 4.8 mEq/L — ABNORMAL HIGH (ref 3.4–4.5)
Sodium: 139 mEq/L (ref 136–145)
Total Bilirubin: 0.4 mg/dl (ref 0.30–1.20)
Total Protein: 6.1 gm/dl (ref 5.7–8.2)

## 2021-02-26 LAB — CBC
Hematocrit: 36 % — ABNORMAL LOW (ref 37.0–50.0)
Hemoglobin: 12.3 gm/dl — ABNORMAL LOW (ref 12.4–17.2)
MCH: 32.4 pg (ref 23.0–34.6)
MCHC: 34.2 gm/dl (ref 30.0–36.0)
MCV: 94.7 fL (ref 80.0–98.0)
MPV: 9.2 fL (ref 6.0–10.0)
Platelets: 360 10*3/uL (ref 140–450)
RBC: 3.8 M/uL (ref 3.80–5.70)
RDW-SD: 42.3 (ref 35.1–43.9)
WBC: 12.9 10*3/uL — ABNORMAL HIGH (ref 4.0–11.0)

## 2021-02-26 LAB — POCT GLUCOSE
POC Glucose: 111 mg/dL — ABNORMAL HIGH (ref 65–105)
POC Glucose: 89 mg/dL (ref 65–105)
POC Glucose: 149 mg/dL — ABNORMAL HIGH (ref 65–105)
POC Glucose: 136 mg/dL — ABNORMAL HIGH (ref 65–105)
POC Glucose: 128 mg/dL — ABNORMAL HIGH (ref 65–105)

## 2021-02-26 LAB — MAGNESIUM
Magnesium: 1.6 mg/dL (ref 1.6–2.6)
Magnesium: 1.6 mg/dL (ref 1.6–2.6)

## 2021-02-26 LAB — PHOSPHORUS
Phosphorus: 4.9 mg/dL (ref 2.4–5.1)
Phosphorus: 4.9 mg/dL (ref 2.4–5.1)

## 2021-02-26 LAB — FOLATE
Folate: 26.7 ng/ml — ABNORMAL HIGH (ref 5.38–24.00)
Folate: 26.7 ng/ml — ABNORMAL HIGH (ref 5.38–24.00)

## 2021-02-26 LAB — METABOLIC PANEL, COMPREHENSIVE
ALT (SGPT): 19 U/L (ref 10–49)
AST (SGOT): 16 U/L (ref 0.0–33.9)
Albumin: 3 gm/dl — ABNORMAL LOW (ref 3.4–5.0)
Alk. phosphatase: 102 U/L (ref 46–116)
Anion gap: 8 mmol/L (ref 5–15)
BUN: 43 mg/dl — ABNORMAL HIGH (ref 9–23)
Bilirubin, total: 0.4 mg/dl (ref 0.30–1.20)
CO2: 23 mEq/L (ref 20–31)
Calcium: 8.7 mg/dl (ref 8.7–10.4)
Chloride: 108 mEq/L — ABNORMAL HIGH (ref 98–107)
Creatinine: 3.8 mg/dl — ABNORMAL HIGH (ref 0.70–1.30)
GFR est AA: 22
GFR est non-AA: 18
Glucose: 101 mg/dl (ref 74–106)
Potassium: 4.8 mEq/L — ABNORMAL HIGH (ref 3.4–4.5)
Protein, total: 6.1 gm/dl (ref 5.7–8.2)
Sodium: 139 mEq/L (ref 136–145)

## 2021-02-26 LAB — CBC W/O DIFF
HCT: 36 % — ABNORMAL LOW (ref 37.0–50.0)
HGB: 12.3 gm/dl — ABNORMAL LOW (ref 12.4–17.2)
MCH: 32.4 pg (ref 23.0–34.6)
MCHC: 34.2 gm/dl (ref 30.0–36.0)
MCV: 94.7 fL (ref 80.0–98.0)
MPV: 9.2 fL (ref 6.0–10.0)
PLATELET: 360 10*3/uL (ref 140–450)
RBC: 3.8 M/uL (ref 3.80–5.70)
RDW-SD: 42.3 (ref 35.1–43.9)
WBC: 12.9 10*3/uL — ABNORMAL HIGH (ref 4.0–11.0)

## 2021-02-26 LAB — GLUCOSE, POC
Glucose (POC): 111 mg/dL — ABNORMAL HIGH (ref 65–105)
Glucose (POC): 89 mg/dL (ref 65–105)
Glucose (POC): 149 mg/dL — ABNORMAL HIGH (ref 65–105)
Glucose (POC): 136 mg/dL — ABNORMAL HIGH (ref 65–105)
Glucose (POC): 128 mg/dL — ABNORMAL HIGH (ref 65–105)

## 2021-02-26 MED ORDER — INSULIN LISPRO 100 UNIT/ML INJECTION
100 unit/mL | Freq: Four times a day (QID) | SUBCUTANEOUS | Status: DC
Start: 2021-02-26 — End: 2021-03-03
  Administered 2021-02-26 – 2021-03-02 (×9): via SUBCUTANEOUS

## 2021-02-26 MED ORDER — INSULIN GLARGINE 100 UNIT/ML INJECTION
100 unit/mL | Freq: Every evening | SUBCUTANEOUS | Status: DC
Start: 2021-02-26 — End: 2021-03-03
  Administered 2021-02-27 – 2021-03-03 (×5): via SUBCUTANEOUS

## 2021-02-26 MED ORDER — METOPROLOL TARTRATE 50 MG TAB
50 mg | Freq: Two times a day (BID) | ORAL | Status: DC
Start: 2021-02-26 — End: 2021-02-26

## 2021-02-26 MED ORDER — HYDRALAZINE 50 MG TAB
50 mg | Freq: Three times a day (TID) | ORAL | Status: DC
Start: 2021-02-26 — End: 2021-03-03
  Administered 2021-02-26 – 2021-03-03 (×16): via ORAL

## 2021-02-26 MED ORDER — FOLIC ACID 1 MG TAB
1 mg | Freq: Every day | ORAL | Status: DC
Start: 2021-02-26 — End: 2021-02-26
  Administered 2021-02-26: 13:00:00 via ORAL

## 2021-02-26 MED ORDER — INSULIN LISPRO 100 UNIT/ML INJECTION
100 unit/mL | SUBCUTANEOUS | Status: DC | PRN
Start: 2021-02-26 — End: 2021-03-05

## 2021-02-26 MED ORDER — CYANOCOBALAMIN (VITAMIN B-12) 2,500 MCG SUBLINGUAL TAB
2500 mcg | Freq: Every day | SUBLINGUAL | Status: DC
Start: 2021-02-26 — End: 2021-03-05
  Administered 2021-02-26 – 2021-03-05 (×8): via SUBLINGUAL

## 2021-02-26 MED ORDER — LABETALOL 5 MG/ML IV SOLN
5 mg/mL | Freq: Once | INTRAVENOUS | Status: AC | PRN
Start: 2021-02-26 — End: 2021-02-26
  Administered 2021-02-26: 08:00:00 via INTRAVENOUS

## 2021-02-26 MED ORDER — THIAMINE HCL 100 MG TAB
100 mg | Freq: Every day | ORAL | Status: DC
Start: 2021-02-26 — End: 2021-03-05
  Administered 2021-02-26 – 2021-03-05 (×8): via ORAL

## 2021-02-26 MED ORDER — HYDRALAZINE 20 MG/ML IJ SOLN
20 mg/mL | Freq: Four times a day (QID) | INTRAMUSCULAR | Status: DC | PRN
Start: 2021-02-26 — End: 2021-03-05
  Administered 2021-02-26 – 2021-02-27 (×2): via INTRAVENOUS

## 2021-02-26 MED ORDER — METOPROLOL TARTRATE 100 MG TAB
100 mg | Freq: Two times a day (BID) | ORAL | Status: DC
Start: 2021-02-26 — End: 2021-03-03
  Administered 2021-02-26 – 2021-03-03 (×11): via ORAL

## 2021-02-26 MED ORDER — LABETALOL 5 MG/ML IV SOLN
5 mg/mL | Freq: Four times a day (QID) | INTRAVENOUS | Status: DC | PRN
Start: 2021-02-26 — End: 2021-02-26
  Administered 2021-02-26: 10:00:00 via INTRAVENOUS

## 2021-02-26 MED ORDER — ERGOCALCIFEROL (VITAMIN D2) 50,000 UNIT CAP
1250 mcg (50,000 unit) | ORAL | Status: DC
Start: 2021-02-26 — End: 2021-03-05
  Administered 2021-02-26 – 2021-03-05 (×2): via ORAL

## 2021-02-26 MED ORDER — DEXTROSE 50% IN WATER (D50W) IV SYRG
INTRAVENOUS | Status: DC | PRN
Start: 2021-02-26 — End: 2021-03-05

## 2021-02-26 MED FILL — ERGOCALCIFEROL (VITAMIN D2) 50,000 UNIT CAP: 1250 mcg (50,000 unit) | ORAL | Qty: 1

## 2021-02-26 MED FILL — AMLODIPINE 5 MG TAB: 5 mg | ORAL | Qty: 2

## 2021-02-26 MED FILL — LEVETIRACETAM 500 MG TAB: 500 mg | ORAL | Qty: 1

## 2021-02-26 MED FILL — LANTUS U-100 INSULIN 100 UNIT/ML SUBCUTANEOUS SOLUTION: 100 unit/mL | SUBCUTANEOUS | Qty: 1

## 2021-02-26 MED FILL — ADMELOG U-100 INSULIN LISPRO 100 UNIT/ML SUBCUTANEOUS SOLUTION: 100 unit/mL | SUBCUTANEOUS | Qty: 1

## 2021-02-26 MED FILL — METOPROLOL TARTRATE 25 MG TAB: 25 mg | ORAL | Qty: 1

## 2021-02-26 MED FILL — VITAMIN B-1 100 MG TABLET: 100 mg | ORAL | Qty: 1

## 2021-02-26 MED FILL — LABETALOL 5 MG/ML IV SOLN: 5 mg/mL | INTRAVENOUS | Qty: 20

## 2021-02-26 MED FILL — PANTOPRAZOLE 40 MG TAB, DELAYED RELEASE: 40 mg | ORAL | Qty: 1

## 2021-02-26 MED FILL — METOPROLOL TARTRATE 100 MG TAB: 100 mg | ORAL | Qty: 1

## 2021-02-26 MED FILL — VITAMIN B-12  2,500 MCG SUBLINGUAL TABLET: 2500 mcg | SUBLINGUAL | Qty: 1

## 2021-02-26 MED FILL — HYDRALAZINE 50 MG TAB: 50 mg | ORAL | Qty: 1

## 2021-02-26 MED FILL — FOLIC ACID 1 MG TAB: 1 mg | ORAL | Qty: 1

## 2021-02-26 MED FILL — HYDRALAZINE 20 MG/ML IJ SOLN: 20 mg/mL | INTRAMUSCULAR | Qty: 1

## 2021-02-26 MED FILL — ROSUVASTATIN 10 MG TAB: 10 mg | ORAL | Qty: 1

## 2021-02-26 NOTE — Progress Notes (Signed)
Progress Note    Patient: Thomas Jackson   Age:  50 y.o.  DOA: 02/24/2021   Admit Dx / CC: LOS:  LOS: 2 days     Active Problems   1.  Acute metabolic encephalopathy multifactorial  2.  Acute right thalamic hemorrhage  3.  Hypertension  4.  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  CKD stage IV  6.  Vitamin D deficiency  7.  Seizure disorder  8.  Hyperlipidemia  9.  History of previous stroke  Plan:   1.  Patient seen and examined.  There has been no change in patient's mental status.  For better hypertension control metoprolol was increased to 100 mg twice daily.  We will add hydralazine 50 mg 3 times daily  2.  Oral intake has improved we will discontinue IV fluids.  3.  Patient was started on thiamine folic acid and L24 supplement  4.  For seizure disorder patient be continued on Keppra 500 mg p.o. twice daily  5.  Patient's condition management plan and discharge planning was discussed with patient significant other in detail requested for skilled nursing facility.  Patient is hemodynamically stable to be transferred to telemetry.  We will consult case manager to assist with discharge planning  Subjective:   Unable to obtain any reasonable history    Objective:     Visit Vitals  BP (!) 164/76   Pulse 75   Temp 98.1 ??F (36.7 ??C)   Resp 15   Ht _0  (1.626 m)   Wt 124.1 kg (273 lb 9.6 oz)   SpO2 98%   BMI 46.96 kg/m??       Physical Exam:  General appearance: Middle-age male well-built sitting comfortably no acute distress  Lungs: CTA bilaterally  Heart: Regular rhythm, no murmur  Abdomen: soft, distended, non-tender. Bowel sounds Audible,  Extremities: No edema, distal pulses palpable  Skin: Warm and dry  Neurologic: Awake and alert oriented to person only  Following commands, able to move all 4 extremities  Intake and Output:  Current Shift:  No intake/output data recorded.  Last three shifts:  07/18 1901 - 07/20 0700  In: 4010 [P.O.:450; I.V.:3181]  Out: 1385  [Urine:1385]    Lab/Data Reviewed:  Recent Results (from the past 48 hour(s))   GLUCOSE, POC    Collection Time: 02/24/21  6:10 PM   Result Value Ref Range    Glucose (POC) 116 (H) 65 - 105 mg/dL   CBC WITH AUTOMATED DIFF    Collection Time: 02/24/21  6:18 PM   Result Value Ref Range    WBC 13.8 (H) 4.0 - 11.0 1000/mm3    RBC 3.97 3.80 - 5.70 M/uL    HGB 12.8 12.4 - 17.2 gm/dl    HCT 37.2 37.0 - 50.0 %    MCV 93.7 80.0 - 98.0 fL    MCH 32.2 23.0 - 34.6 pg    MCHC 34.4 30.0 - 36.0 gm/dl    PLATELET 383 140 - 450 1000/mm3    MPV 8.9 6.0 - 10.0 fL    RDW-SD 43.5 35.1 - 43.9      NRBC 0 0 - 0      IMMATURE GRANULOCYTES 0.3 0.0 - 3.0 %    NEUTROPHILS 74.5 (H) 34 - 64 %    LYMPHOCYTES 17.9 (L) 28 - 48 %    MONOCYTES 4.8 1 - 13 %    EOSINOPHILS 1.9 0 - 5 %  BASOPHILS 0.6 0 - 3 %   METABOLIC PANEL, COMPREHENSIVE    Collection Time: 02/24/21  6:18 PM   Result Value Ref Range    Potassium 4.9 (H) 3.4 - 4.5 mEq/L    Chloride 106 98 - 107 mEq/L    Sodium 137 136 - 145 mEq/L    CO2 23 20 - 31 mEq/L    Glucose 120 (H) 74 - 106 mg/dl    BUN 41 (H) 9 - 23 mg/dl    Creatinine 3.51 (H) 0.70 - 1.30 mg/dl    GFR est AA 24.0      GFR est non-AA 20      Calcium 8.8 8.7 - 10.4 mg/dl    Anion gap 8 5 - 15 mmol/L    AST (SGOT) 18.0 0.0 - 33.9 U/L    ALT (SGPT) 25 10 - 49 U/L    Alk. phosphatase 109 46 - 116 U/L    Bilirubin, total 0.50 0.30 - 1.20 mg/dl    Protein, total 6.4 5.7 - 8.2 gm/dl    Albumin 3.1 (L) 3.4 - 5.0 gm/dl   PROTHROMBIN TIME + INR    Collection Time: 02/24/21  6:18 PM   Result Value Ref Range    Prothrombin time 12.1 10.2 - 12.9 seconds    INR 1.0 0.1 - 1.1     GLUCOSE, POC    Collection Time: 02/25/21  2:21 AM   Result Value Ref Range    Glucose (POC) 107 (H) 65 - 105 mg/dL   CBC WITH AUTOMATED DIFF    Collection Time: 02/25/21  3:58 AM   Result Value Ref Range    WBC 14.8 (H) 4.0 - 11.0 1000/mm3    RBC 3.89 3.80 - 5.70 M/uL    HGB 12.5 12.4 - 17.2 gm/dl    HCT 37.0 37.0 - 50.0 %    MCV 95.1 80.0 - 98.0 fL    MCH 32.1  23.0 - 34.6 pg    MCHC 33.8 30.0 - 36.0 gm/dl    PLATELET 384 140 - 450 1000/mm3    MPV 9.1 6.0 - 10.0 fL    RDW-SD 42.5 35.1 - 43.9      NRBC 0 0 - 0      IMMATURE GRANULOCYTES 0.3 0.0 - 3.0 %    NEUTROPHILS 67.8 (H) 34 - 64 %    LYMPHOCYTES 23.2 (L) 28 - 48 %    MONOCYTES 7.1 1 - 13 %    EOSINOPHILS 1.1 0 - 5 %    BASOPHILS 0.5 0 - 3 %   METABOLIC PANEL, COMPREHENSIVE    Collection Time: 02/25/21  3:58 AM   Result Value Ref Range    Potassium 5.0 (H) 3.4 - 4.5 mEq/L    Chloride 108 (H) 98 - 107 mEq/L    Sodium 141 136 - 145 mEq/L    CO2 22 20 - 31 mEq/L    Glucose 110 (H) 74 - 106 mg/dl    BUN 44 (H) 9 - 23 mg/dl    Creatinine 3.53 (H) 0.70 - 1.30 mg/dl    GFR est AA 24.0      GFR est non-AA 20      Calcium 8.6 (L) 8.7 - 10.4 mg/dl    Anion gap 11 5 - 15 mmol/L    AST (SGOT) 15.0 0.0 - 33.9 U/L    ALT (SGPT) 26 10 - 49 U/L    Alk. phosphatase 109 46 - 116 U/L  Bilirubin, total 0.50 0.30 - 1.20 mg/dl    Protein, total 5.7 5.7 - 8.2 gm/dl    Albumin 2.9 (L) 3.4 - 5.0 gm/dl   FOLATE    Collection Time: 02/25/21  3:58 AM   Result Value Ref Range    Folate 26.70 (H) 5.38 - 24.00 ng/ml   HEMOGLOBIN A1C W/O EAG    Collection Time: 02/25/21  3:58 AM   Result Value Ref Range    Hemoglobin A1c 6.1 (H) 3.8 - 5.6 %   LIPID PANEL    Collection Time: 02/25/21  3:58 AM   Result Value Ref Range    Cholesterol, total 134 0 - 199 mg/dl    HDL Cholesterol 34 (L) 40 - 60 mg/dl    Triglyceride 181 (H) 0 - 150 mg/dl    LDL, calculated 64 0 - 130 mg/dl    CHOL/HDL Ratio 3.9 0.0 - 5.0 Ratio   MAGNESIUM    Collection Time: 02/25/21  3:58 AM   Result Value Ref Range    Magnesium 1.7 1.6 - 2.6 mg/dL   PHOSPHORUS    Collection Time: 02/25/21  3:58 AM   Result Value Ref Range    Phosphorus 5.0 2.4 - 5.1 mg/dL   TSH 3RD GENERATION    Collection Time: 02/25/21  3:58 AM   Result Value Ref Range    TSH 2.782 0.550 - 4.780 uIU/mL   VITAMIN B12    Collection Time: 02/25/21  3:58 AM   Result Value Ref Range    Vitamin B12 377 211 - 911 pg/ml    GLUCOSE, POC    Collection Time: 02/25/21  5:54 AM   Result Value Ref Range    Glucose (POC) 104 65 - 105 mg/dL   GLUCOSE, POC    Collection Time: 02/25/21  8:32 AM   Result Value Ref Range    Glucose (POC) 118 (H) 65 - 105 mg/dL   GLUCOSE, POC    Collection Time: 02/25/21 12:22 PM   Result Value Ref Range    Glucose (POC) 115 (H) 65 - 706 mg/dL   METABOLIC PANEL, BASIC    Collection Time: 02/25/21 12:42 PM   Result Value Ref Range    Potassium 4.8 (H) 3.4 - 4.5 mEq/L    Chloride 109 (H) 98 - 107 mEq/L    Sodium 140 136 - 145 mEq/L    CO2 22 20 - 31 mEq/L    Glucose 108 (H) 74 - 106 mg/dl    BUN 38 (H) 9 - 23 mg/dl    Creatinine 3.50 (H) 0.70 - 1.30 mg/dl    GFR est AA 24.0      GFR est non-AA 20      Calcium 8.2 (L) 8.7 - 10.4 mg/dl    Anion gap 9 5 - 15 mmol/L   GLUCOSE, POC    Collection Time: 02/25/21  5:43 PM   Result Value Ref Range    Glucose (POC) 162 (H) 65 - 105 mg/dL   GLUCOSE, POC    Collection Time: 02/25/21  9:07 PM   Result Value Ref Range    Glucose (POC) 111 (H) 65 - 237 mg/dL   METABOLIC PANEL, COMPREHENSIVE    Collection Time: 02/26/21 12:30 AM   Result Value Ref Range    Potassium 4.8 (H) 3.4 - 4.5 mEq/L    Chloride 108 (H) 98 - 107 mEq/L    Sodium 139 136 - 145 mEq/L    CO2 23 20 - 31 mEq/L  Glucose 101 74 - 106 mg/dl    BUN 43 (H) 9 - 23 mg/dl    Creatinine 3.80 (H) 0.70 - 1.30 mg/dl    GFR est AA 22.0      GFR est non-AA 18      Calcium 8.7 8.7 - 10.4 mg/dl    Anion gap 8 5 - 15 mmol/L    AST (SGOT) 16.0 0.0 - 33.9 U/L    ALT (SGPT) 19 10 - 49 U/L    Alk. phosphatase 102 46 - 116 U/L    Bilirubin, total 0.40 0.30 - 1.20 mg/dl    Protein, total 6.1 5.7 - 8.2 gm/dl    Albumin 3.0 (L) 3.4 - 5.0 gm/dl   CBC W/O DIFF    Collection Time: 02/26/21 12:30 AM   Result Value Ref Range    WBC 12.9 (H) 4.0 - 11.0 1000/mm3    RBC 3.80 3.80 - 5.70 M/uL    HGB 12.3 (L) 12.4 - 17.2 gm/dl    HCT 36.0 (L) 37.0 - 50.0 %    MCV 94.7 80.0 - 98.0 fL    MCH 32.4 23.0 - 34.6 pg    MCHC 34.2 30.0 - 36.0 gm/dl     PLATELET 360 140 - 450 1000/mm3    MPV 9.2 6.0 - 10.0 fL    RDW-SD 42.3 35.1 - 43.9     MAGNESIUM    Collection Time: 02/26/21 12:30 AM   Result Value Ref Range    Magnesium 1.6 1.6 - 2.6 mg/dL   PHOSPHORUS    Collection Time: 02/26/21 12:30 AM   Result Value Ref Range    Phosphorus 4.9 2.4 - 5.1 mg/dL   GLUCOSE, POC    Collection Time: 02/26/21  8:10 AM   Result Value Ref Range    Glucose (POC) 89 65 - 105 mg/dL   GLUCOSE, POC    Collection Time: 02/26/21 12:10 PM   Result Value Ref Range    Glucose (POC) 149 (H) 65 - 105 mg/dL   GLUCOSE, POC    Collection Time: 02/26/21  4:09 PM   Result Value Ref Range    Glucose (POC) 136 (H) 65 - 105 mg/dL       Imaging:  No results found.      Medications Reviewed:  Current Facility-Administered Medications   Medication Dose Route Frequency    hydrALAZINE (APRESOLINE) tablet 50 mg  50 mg Oral TID    metoprolol tartrate (LOPRESSOR) tablet 100 mg  100 mg Oral BID    thiamine HCL (B-1) tablet 100 mg  100 mg Oral DAILY    folic acid (FOLVITE) tablet 1 mg  1 mg Oral DAILY    cyanocobalamin (VITAMIN B12) sublingual tablet 2,500 mcg  2,500 mcg SubLINGual DAILY    ergocalciferol capsule 50,000 Units  50,000 Units Oral Q7D    dextrose (D50W) injection syrg 5-25 g  10-50 mL IntraVENous PRN    insulin glargine (LANTUS) injection 1-100 Units  1-100 Units SubCUTAneous QHS    insulin lispro (HUMALOG) injection 1-100 Units  1-100 Units SubCUTAneous AC&HS    insulin lispro (HUMALOG) injection 1-100 Units  1-100 Units SubCUTAneous PRN    hydrALAZINE (APRESOLINE) 20 mg/mL injection 20 mg  20 mg IntraVENous Q6H PRN    amLODIPine (NORVASC) tablet 10 mg  10 mg Oral DAILY    glucagon (GLUCAGEN) injection 1 mg  1 mg IntraMUSCular PRN    levETIRAcetam (KEPPRA) tablet 500 mg  500 mg Oral BID  pantoprazole (PROTONIX) tablet 40 mg  40 mg Oral ACB    naloxone (NARCAN) injection 0.1 mg  0.1 mg IntraVENous PRN    ondansetron (ZOFRAN) injection 4 mg  4 mg IntraVENous Q4H PRN    rosuvastatin (CRESTOR)  tablet 10 mg  10 mg Oral QHS         Dragon medical dictation software was used for portions of this report. Unintended errors may occur.   Elon Spanner, MD  P# [phone]  February 26, 2021

## 2021-02-26 NOTE — Progress Notes (Signed)
[facility]    ICU Daily Progress Note    Patient: Thomas Jackson               Sex: male          DOA: 02/24/2021  Date of Birth:  [birth year]       Age:  50 y.o.        LOS:  LOS: 2 days        Code Status: Full Code        [x] I have reviewed the flowsheet and previous day???s notes. Events, vitals, medications and notes from last 24 hours reviewed.   IMPRESSION   50 y.o.m with PMHx DM2 with insulin dependence, seizures, HTN, CAD and previous intracranial hemorrhage in 2015 and again January 2022. Evaluated at [facility] after family felt his mentation had been off for 4 days with multiple falls, CT head indicating ICH. Transferred to [facility] for neurosurgery consultation. Remains in ICU due to BP constraints on Cardene gtt.       Interval HISTORY     Interval History/Past 24 Hour Events:  Patient is alert and oriented to self.  Still with word salad and expressive aphasia, unable to determine full orientation.  Patient nods no to any complaints at this time.  Denies pain, nausea, dizziness or headache.    ASSESSMENT:   Active problem list:  - Acute right thalamic hemorrhage with new minimal extension into the third ventricle on CT 02/24/2021 at [facility].  This is increased since previously visualized hemorrhage on August 15, 2020. Last NIH 7. No intervention per Neurosurgery.  stable on CT head 02/25/2021.  - Acute on chronic kidney disease. Baseline creatinine 3 in 09/2020. Cr 3.51 > 3.53>3.8  - Seizure disorder  - Diabetes  - hyperkalemia 5.0.   - Hypertension: uncontrolled. Suspect Noncomplaince with meds.  - History of coronary artery disease  - Hypothyroidism following partial thyroidectomy  - Hyperlipidemia   -History of stroke in 2013 with right-sided deficits  - History of intracranial hemorrhage January 2022 seen at [facility] in [place]:    - Currently on room air. Supplemental PRN  to keep SpO2 >92%.  -Cardene off  overnight.  - continue regular diet with aspiration precautions   - continue norvasc, hydralazine. increase metoprolol d/t HTN overnight requiring labetalol.   -Hydralazine as needed for SBP >150  - Keppra twice daily  - Home medications per hospitalist  - NIH and neurochecks per neurosurgery  -Avoid nephrotoxins. Renally dose medications. Monitor urine output. Daily weights  -Electrolyte replacements as needed.   - Glucommander per protocol  -SLP, OT and PT consults  -transfer to telemetry neuro floor  -PCCM will sign off    SUP: protonix  DVT prophylaxis: SCDs no anticoagulation due to [place]    My assessment, plan of care, findings, medications, side effects etc discussed with nursing staff.                  ROS     Review of Systems   Constitutional: Negative.    HENT: Negative.     Respiratory: Negative.     Cardiovascular: Negative.    Gastrointestinal: Negative.    Genitourinary: Negative.    Neurological:  Positive for speech difficulty and weakness.       Physical Exam       GENERAL: Awake, alert, comfortable.  Aphasic with nonsensical response  HEENT: Oral mucosa moist.  No thrush.   NECK: Supple. Trachea midline.   RESPIRATORY: Bilateral BS present, decreased at bases. No rales or rhonchi.   CARDIOVASCULAR: S1 and S2 present. No murmur, rub, or thrill.   ABDOMEN: Soft and nontender with positive bowel sounds. No organomegaly.   MUSCULOSKELETAL: No joint effusions or joint tenderness.   SKIN: No rash. Skin is warm, dry, and intact.   NEUROLOGICAL: Conscious, aphasic moving all 4 extremities with no obvious deficit.        Recent Labs     02/26/21  0030 02/25/21  1242 02/25/21  0358 02/24/21  1818   WBC 12.9*  --  14.8* 13.8*   HCT 36.0*  --  37.0 37.2   BUN 43* 38* 44* 41*   NA 139 140 141 137         Recent Results (from the past 24 hour(s))   GLUCOSE, POC    Collection Time: 02/25/21  8:32 AM   Result Value Ref Range    Glucose (POC) 118 (H) 65 - 105 mg/dL   GLUCOSE, POC    Collection Time: 02/25/21 12:22 PM    Result Value Ref Range    Glucose (POC) 115 (H) 65 - 932 mg/dL   METABOLIC PANEL, BASIC    Collection Time: 02/25/21 12:42 PM   Result Value Ref Range    Potassium 4.8 (H) 3.4 - 4.5 mEq/L    Chloride 109 (H) 98 - 107 mEq/L    Sodium 140 136 - 145 mEq/L    CO2 22 20 - 31 mEq/L    Glucose 108 (H) 74 - 106 mg/dl    BUN 38 (H) 9 - 23 mg/dl    Creatinine 3.50 (H) 0.70 - 1.30 mg/dl    GFR est AA 24.0      GFR est non-AA 20      Calcium 8.2 (L) 8.7 - 10.4 mg/dl    Anion gap 9 5 - 15 mmol/L   GLUCOSE, POC    Collection Time: 02/25/21  5:43 PM   Result Value Ref Range    Glucose (POC) 162 (H) 65 - 105 mg/dL   GLUCOSE, POC    Collection Time: 02/25/21  9:07 PM   Result Value Ref Range    Glucose (POC) 111 (H) 65 - 355 mg/dL   METABOLIC PANEL, COMPREHENSIVE    Collection Time: 02/26/21 12:30 AM   Result Value Ref Range    Potassium 4.8 (H) 3.4 - 4.5 mEq/L    Chloride 108 (H) 98 - 107 mEq/L    Sodium 139 136 - 145 mEq/L    CO2 23 20 - 31 mEq/L    Glucose 101 74 - 106 mg/dl    BUN 43 (H) 9 - 23 mg/dl    Creatinine 3.80 (H) 0.70 - 1.30 mg/dl    GFR est AA 22.0      GFR est non-AA 18      Calcium 8.7 8.7 - 10.4 mg/dl    Anion gap 8 5 - 15 mmol/L    AST (SGOT) 16.0 0.0 - 33.9 U/L    ALT (SGPT) 19 10 - 49 U/L    Alk. phosphatase 102 46 - 116 U/L    Bilirubin, total 0.40 0.30 - 1.20 mg/dl    Protein, total 6.1 5.7 - 8.2 gm/dl    Albumin 3.0 (L) 3.4 - 5.0 gm/dl   CBC W/O DIFF    Collection Time: 02/26/21 12:30 AM   Result Value Ref Range  WBC 12.9 (H) 4.0 - 11.0 1000/mm3    RBC 3.80 3.80 - 5.70 M/uL    HGB 12.3 (L) 12.4 - 17.2 gm/dl    HCT 36.0 (L) 37.0 - 50.0 %    MCV 94.7 80.0 - 98.0 fL    MCH 32.4 23.0 - 34.6 pg    MCHC 34.2 30.0 - 36.0 gm/dl    PLATELET 360 140 - 450 1000/mm3    MPV 9.2 6.0 - 10.0 fL    RDW-SD 42.3 35.1 - 43.9     MAGNESIUM    Collection Time: 02/26/21 12:30 AM   Result Value Ref Range    Magnesium 1.6 1.6 - 2.6 mg/dL   PHOSPHORUS    Collection Time: 02/26/21 12:30 AM   Result Value Ref Range    Phosphorus 4.9  2.4 - 5.1 mg/dL        Imaging:  CT Results (most recent):  Results from Hospital Encounter encounter on 02/24/21    CT HEAD WO CONT    Narrative  INDICATION: icb.  COMPARISON: 02/24/2021.  TECHNIQUE: Axial Head CT without IV contrast. Sagittal and coronal  reconstructions were performed.    All CT exams at this facility use one or more dose reduction techniques  including automatic exposure control, mA/kV adjustment per patient's size, or  iterative reconstruction technique.    FINDINGS:  Again noted is focal hemorrhage centered in the right basal ganglia extending  into the right lateral and third ventricles. The overall size of the hemorrhage  measures 2.2 x 1.5 cm, unchanged.    Ventricles and sulci are normal in size and configuration.  Encephalomalacia/gliosis from remote infarct in the left posterior frontal,  parietal, temporal, and occipital lobes. Encephalomalacia from remote infarct in  the left basal ganglia. No CT evidence of acute large territorial infarction.    Imaged portions of the paranasal sinuses and mastoid air cells are well aerated.  Calvarium is unremarkable.    Impression  IMPRESSION:  1.  Stable hemorrhage in the right basal ganglia extending to the right lateral  ventricle and third ventricle.  2.  Sequelae of remote left MCA territory infarction.      Verline Lema, FNP ACNPC-AG    Pulmonary Critical Care Medicine  [address] [facility]   February 26, 2021 9:08 AM       I have reviewed the patient's chart. I saw and examined the patient, personally performed the critical or key portions of the service and discussed the management with the treatment team. I reviewed the NP's note and concur with the history, findings and plan of care, which include's my edits and my assessment and plan. Laboratory, hemodynamic and radiological data were reviewed.      Murriel Hopper MD

## 2021-02-26 NOTE — Progress Notes (Signed)
Problem: Falls - Risk of  Goal: *Absence of Falls  Description: Document Schmid Fall Risk and appropriate interventions in the flowsheet.  Outcome: Progressing Towards Goal  Note: Fall Risk Interventions:  Mobility Interventions: Bed/chair exit alarm, Patient to call before getting OOB    Mentation Interventions: Bed/chair exit alarm    Medication Interventions: Bed/chair exit alarm, Patient to call before getting OOB    Elimination Interventions: Call light in reach, Bed/chair exit alarm    History of Falls Interventions: Bed/chair exit alarm

## 2021-02-26 NOTE — Progress Notes (Signed)
SW CONSULT: PASRR    Previous PASRR 2013    New PASRR initiated in [place] MUST    PASRR completed and updated due to patent having an existing PASRR.  Copy loaded to media and sent to [facility] via [employer] (FAX)

## 2021-02-26 NOTE — Progress Notes (Signed)
Bedside shift change report given to Maralyn Sago, RN (oncoming nurse) by Lafonda Mosses, RN (offgoing nurse). Report included the following information SBAR, Kardex, MAR, and Recent Results.

## 2021-02-26 NOTE — Progress Notes (Signed)
 SPEECH LANGUAGE PATHOLOGY   BEDSIDE DYSPHAGIA TREATMENT      Patient: Thomas Jackson (49 y.o. male) 06-14-1971  Room: I204/I204  Primary Diagnosis: Thalamic hemorrhage (HCC) [I61.0]          Isolation:  There are currently no Active Isolations       MDRO: No current active infections  PPE: surgical mask, gloves, goggles  Precautions:  universal, aspiration, and fall     Date: 02/26/2021  Start Time:  1206 End Time:  1214    TREATMENT:    Treatment: Swallow treatment completed at bedside. Patient alert and consumed solids without any overt clinical signs or symptoms suggestive of penetration or aspiration. Patient without coughing or vocal changes with trials. Patient with cough x1 with large sequential sips of thin liquids. Patient without further coughing with single sips of thin liquids. Recommend IDDSI-7 Regular diet with IDDSI-0 Thin liquids as tolerated. Meds as tolerated. Standard aspiration precautions with intermittent supervision for PO. POC discussed with patient and RN. SLP will follow accordingly.    Functional Oral Intake Scale: Level 7: Total oral intake without restrictions    PLAN/ RECOMMENDATIONS:                 Diet Recommendations: continue IDDSI 7- regular, IDDSI 0- thin   Compensatory Swallow Strategies: small bites/ sip, slow rate of eating/ feeding, alternate solids/ liquids, meticulous oral care at least 3x/ day, basic compensatory swallow strategies   Medication Administration: as tolerated   Aspiration Precautions: intermittent supervision with meals  Risk(s) for Aspiration: mental ability/ status     Therapy Recommendations: pt would benefit from continuation of SLP as per POC    Therapy Prognosis: fair    Discharge Recommendations: no follow up indicated  Other Recommended Services: OT, PT, Dietitian     SUBJECTIVE:    Patient Stated: hello.   Pain Prior to Treatment: no pain reported   Pain Following Treatment: no pain reported     OBJECTIVE:    Position: b/s chair  Consistencies: thin  via straw, regular   Oral Phase: WFL  Pharyngeal Phase: immediate cough following thin  Exercises:  n/a    Safety: Following session, pt in b/s chair and tray table, phone, and call bell within reach   Modified Rankin Score (MRS): Defer to OT/ PT    EDUCATION:   Education Provided: POC and diet recommendations  Individual Educated: patient  Comprehension: query pt's level carry over  Staff Education: Educated Nurse to POC and diet recommendations.       Thank you,  Camie Pickles MA-CCC-SLP  Speech Pathologist   Office [phone]

## 2021-02-26 NOTE — Progress Notes (Signed)
 Progress Notes by Lavella Nat BROCKS, PT at 02/26/21 1233                Author: Lavella Nat BROCKS, PT  Service: Physical Therapy  Author Type: Physical Therapist       Filed: 02/26/21 1241  Date of Service: 02/26/21 1233  Status: Signed          Editor: Lavella Nat BROCKS, PT (Physical Therapist)                    PHYSICAL THERAPY TREATMENT:        Patient: Jarred Purtee (50 y.o. 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 Aphasia.          Isolation:   There are currently no Active Isolations       MDRO: No active infections        Equipment: rolling walker, gait belt          Assessment:         Based on the objective data described below, the patient presents with   - ICU Mobility Level:  8 - Walking with assist of 1 person   - Modified Rankin Score (mRS): 3 - Moderate disability; requires some help but able to walk without (human) assistance - cane, walker etc.    - impaired but improving mobility   - active participation in mobility training, therapeutic activities, and gait training   - min/mod A x 1 supine > sit   - active participation in gait training: 60 feet with gait belt/rolling walker, CGA assist x 1   - decreased independence with functional mobility   - improving tolerance to therapeutic exercises/activities   - good motivation to participate in PT to return to prior level of function   - progressing slowly towards PT goals      Functional Status Score for the Intensive Care Unit (FSS-ICU)      Task   Score     1.  Rolling  5 - Requires cues or coaxing, but can perform physically without assistance     2. Supine to Sit Transfer  4 - Min assist (patient performs 75% or more of the work)     3. Sit to Stand Transfer  5 - Requires cues or coaxing, but  can physically perform without assistance (may push up from surface)     4. Sitting Edge of Bed  7 - Independent, maintains hands free     5. Walking  2 - Walks 50+ feet with the assistance of only 1 person      TOTAL SCORE:  23/35        INITIAL TOTAL SCORE:  22/35                  Recommendations:   Recommend continued physical therapy during acute stay. Occupational Therapy and Speech Therapy.  Recommend out of bed activity to counteract ill effects of bedrest, with assistance from staff as needed.   Discharge Recommendations: Skilled nursing facility (SNF): patient will benefit from further therapy at rehab facility  to increase strength and  endurance to return to prior level of function.   Further Equipment Recommendations for Discharge: DME needs to be determined at time of d/c from rehab.          Plan:         Patient will be followed by physical therapy to address goals per initial  plan of care.        Subjective:         Patient agreeable to PT/OT co-treatment to maximize safety and mobility. PT emphasis on balance and mobility and OT focus on ADLs. Patient pleasant and cooperative with therapy, continues  to demonstrate expressive aphasia. Uses yes/no consistently to answer simple questions. Well it depends, there's the 12 Patient trying to state the year but begins to read the numbers from the clock hanging on the wall.   Patient reports 0/10 pain before treatment and 0/10 pain at conclusion of treatment.        Objective Data Summary:         Orders, labs, and chart reviewed on [facility]. Communicated with patient's nurse (patient ok to be seen by PT).        Patient found:        Bed, (+) bed/chair exit alarm, (+) ICU/step down equipment, (+) adult brief   Most recent value for oxygen in flow sheets:   O2 Device: None (Room air) (02/26/21 0400)               Cognitive Status:        Mental Status: Alert and oriented x at least 1, limited by aphasia.     Safety/Judgement: needs cueing for safety  and precautions.        Therapeutic Activities; Functional Mobility and Balance Status:         Focus on functional mobility training, gait training, therapeutic exercises, therapeutic activities, patient education. Provided cueing for hand placement, technique, and safety.          Bed Mobility:      Functional Status     Comments         Supine to sit  min/mod assist/1 person assist, verbal cues, set-up, requires additional time, use of bed rails           Sit to supine  not tested, transferred to bedside chair           Rolling  contact guard/min assist, verbal cues, requires additional time, use of bed rails              Transfers:      Functional Status     Comments         Sit to stand  contact guard assist/1 person assist, verbal cues, set-up, requires additional time, assistive device:rolling walker, gait belt       Stand to sit  contact guard assist/1 person assist, verbal cues, set-up, requires additional time, assistive device:rolling walker, gait belt           bed to bedside chair  contact guard assist/1 person assist, verbal cues, set-up, requires additional time, assistive device:rolling walker, gait belt              Ambulation/Gait Training:           Distance   60 feet      Analysis   reciprocal gait pattern, decreased walking speed, shuffled gait, displays mild LOB when challenged with distraction, motivated to increase distance  Assistance   contact guard assist/1 person assist, verbal cues, tactile cues, set-up, requires additional time and cueing for posture, sequencing, walker management, activity  pacing, technique, obstacle avoidance, safety, and safety with turns         DME   rolling walker, gait belt           Stair Training   not tested, patient does not have to negotiate stairs at home            Functional Balance               Static sitting    Good static: patient able to maintain balance  without handhold support, limited postural sway      Dynamic sitting    Fair dynamic:  patient accepts minimal challenge; able to maintain balance while turning head/trunk      Static standing    needs assistive device, fair (accepts min challenge)         Dynamic standing    needs assistive device, fair (accepts min challenge)          Therapeutic Exercises:            Therapeutic Exercises   performed a few reps of each, AROM BLE, needs verbal cues to stay on task:    ankle pumps, heel slides in supine, hip ab/duction in supine, long arc quads (LAQ), straight leg raise, seated marches, standing marches           Balance   Activities/   Neuromuscular   Re-Education   Worked to improve reactive postural responses: stable surface,  standing, sitting, weight shift, bilaterally.     Required CGA for balance support/correction and verbal cues for safety and technique.           Activity Tolerance:        - requires increased time, rest breaks and repeated attempts with functional tasks   - motivated to increase activity   - generalized muscle weakness   - vitals stable   - no apparent distress        Final Location:        Seated in bed side chair, all needs within reach. Patient agrees to call for assistance. Positioned with pillows for comfort. Patient set up with tray. (+) chair alarm. (+) nurse notified. Patient watching  tv. Patient is glad to be OOB.        Communication/Education:        Education: benefits of activity, OOB with assist from staff, OOB activity as tolerated, OOB to chair for meals and mobilize as tolerated, activity  as tolerated to counteract ill effects of bedrest, promote healing and return to prior level of function, activity pacing, energy conservation, call staff for assistance, safety, HEP, ankle pumps to promote improved circulation and prevent DVTs, precautions,  DME, disposition, role of PT, PT plan of care, reviewed previously taught information, all questions answered.      Education provided to: patient    Opportunity for questions and clarification was provided.       Readiness to learn indicated by: trying to perform skills, asking questions, and showing interest      Barriers to learning/limitations:  Yes;  sensory deficits: vision/hearing/speech      Comprehension: In agreement      Thank you for this referral.   Nat JAYSON Friedman, PT, DPT

## 2021-02-26 NOTE — Progress Notes (Signed)
 OCCUPATIONAL THERAPY TREATMENT     Patient: Thomas Jackson (49 y.o. 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    ASSESSMENT:      Patient agreeable to OT treatment. Able to state his name, otherwise continued expressive aphasia. Min A bed mob, CGA x2 sit<>stand, CGA functional ambulation. Min A for LB ADLs, extra time required. Up in chair at end of session.     Progression toward goals:  [x]           Improving appropriately and progressing toward goals  []           Improving slowly and progressing toward goals  []           Not making progress toward goals and plan of care will be adjusted     Modified Rankin Score (mRS): 3 - Moderate disability; requires some help but able to walk without (human) assistance - cane, walker etc.     PLAN: 1-5x/week    Patient continues to benefit from skilled intervention to address the above impairments.  Continue treatment per established plan of care.    Discharge Recommendations: SNF   Further Equipment Recommendations for Discharge: None identified at this time.     EDUCATION:     Barriers to Learning/Limitations: yes;  sensory deficits-vision/hearing/speech  Education provided: Patient, Role of OT, Benefit of activity while hospitalized, Call for assistance, Out of bed 2-3 times/day, Staff assistance with mobility, Changes positions frequently, ADL training, Safety, Functional mobility, Demonstrates adequately, and Verbalized understanding.  Educational Handouts issued: NONE.    SUBJECTIVE:   Thomas Jackson.    OBJECTIVE DATA SUMMARY:   Patient found: Bed, Telemetry, ICU/stepdown equip, and Bed alarm    Pain assessment: None reported  Pain location: N/A    Mental Status: Oriented to, person, and  place  Attention span: good(>65min)  Follows commands: impaired and 1 step    Activities of Daily Living:  LB Dressing -  min. assist to don/doff socks seated in chair  Mobility:  Right Rolling -  min. assist  Supine to sit -  min. assist  Sit to Stand -  contact guard  Stand to Sit -  contact guard  Functional ambulation into hallway with CGA using RW.   Balance:  Static Sitting Balance -  good-  Dynamic Sitting Balance -  fair  Static Standing Balance -  fair  Dynamic Standing Balance -  fair-.  Activity Tolerance: good.    Final Location: bedside chair, chair alarm, all needs close, agrees to call for assistance, and nurse notified     Thank you.  Leita MARLA Shan, OTR/L, CNS  Certified Neuro Specialist

## 2021-02-27 LAB — CBC
Hematocrit: 36.6 % — ABNORMAL LOW (ref 37.0–50.0)
Hemoglobin: 12.5 gm/dl (ref 12.4–17.2)
MCH: 32 pg (ref 23.0–34.6)
MCHC: 34.2 gm/dl (ref 30.0–36.0)
MCV: 93.6 fL (ref 80.0–98.0)
MPV: 9.3 fL (ref 6.0–10.0)
Platelets: 373 10*3/uL (ref 140–450)
RBC: 3.91 M/uL (ref 3.80–5.70)
RDW-SD: 43.2 (ref 35.1–43.9)
WBC: 12.8 10*3/uL — ABNORMAL HIGH (ref 4.0–11.0)

## 2021-02-27 LAB — COMPREHENSIVE METABOLIC PANEL
ALT: 16 U/L (ref 10–49)
AST: 14 U/L (ref 0.0–33.9)
Albumin: 3.1 gm/dl — ABNORMAL LOW (ref 3.4–5.0)
Alkaline Phosphatase: 98 U/L (ref 46–116)
Anion Gap: 10 mmol/L (ref 5–15)
BUN: 44 mg/dl — ABNORMAL HIGH (ref 9–23)
CO2: 20 mEq/L (ref 20–31)
Calcium: 8.6 mg/dl — ABNORMAL LOW (ref 8.7–10.4)
Chloride: 106 mEq/L (ref 98–107)
Creatinine: 3.89 mg/dl — ABNORMAL HIGH (ref 0.70–1.30)
EGFR IF NonAfrican American: 18
GFR African American: 21
Glucose: 111 mg/dl — ABNORMAL HIGH (ref 74–106)
Potassium: 4.8 mEq/L — ABNORMAL HIGH (ref 3.4–4.5)
Sodium: 136 mEq/L (ref 136–145)
Total Bilirubin: 0.3 mg/dl (ref 0.30–1.20)
Total Protein: 6.1 gm/dl (ref 5.7–8.2)

## 2021-02-27 LAB — POCT GLUCOSE
POC Glucose: 108 mg/dL — ABNORMAL HIGH (ref 65–105)
POC Glucose: 97 mg/dL (ref 65–105)
POC Glucose: 177 mg/dL — ABNORMAL HIGH (ref 65–105)
POC Glucose: 102 mg/dL (ref 65–105)

## 2021-02-27 LAB — GLUCOSE, POC
Glucose (POC): 108 mg/dL — ABNORMAL HIGH (ref 65–105)
Glucose (POC): 97 mg/dL (ref 65–105)
Glucose (POC): 177 mg/dL — ABNORMAL HIGH (ref 65–105)
Glucose (POC): 102 mg/dL (ref 65–105)

## 2021-02-27 LAB — METABOLIC PANEL, COMPREHENSIVE
ALT (SGPT): 16 U/L (ref 10–49)
AST (SGOT): 14 U/L (ref 0.0–33.9)
Albumin: 3.1 gm/dl — ABNORMAL LOW (ref 3.4–5.0)
Alk. phosphatase: 98 U/L (ref 46–116)
Anion gap: 10 mmol/L (ref 5–15)
BUN: 44 mg/dl — ABNORMAL HIGH (ref 9–23)
Bilirubin, total: 0.3 mg/dl (ref 0.30–1.20)
CO2: 20 mEq/L (ref 20–31)
Calcium: 8.6 mg/dl — ABNORMAL LOW (ref 8.7–10.4)
Chloride: 106 mEq/L (ref 98–107)
Creatinine: 3.89 mg/dl — ABNORMAL HIGH (ref 0.70–1.30)
GFR est AA: 21
GFR est non-AA: 18
Glucose: 111 mg/dl — ABNORMAL HIGH (ref 74–106)
Potassium: 4.8 mEq/L — ABNORMAL HIGH (ref 3.4–4.5)
Protein, total: 6.1 gm/dl (ref 5.7–8.2)
Sodium: 136 mEq/L (ref 136–145)

## 2021-02-27 LAB — CBC W/O DIFF
HCT: 36.6 % — ABNORMAL LOW (ref 37.0–50.0)
HGB: 12.5 gm/dl (ref 12.4–17.2)
MCH: 32 pg (ref 23.0–34.6)
MCHC: 34.2 gm/dl (ref 30.0–36.0)
MCV: 93.6 fL (ref 80.0–98.0)
MPV: 9.3 fL (ref 6.0–10.0)
PLATELET: 373 10*3/uL (ref 140–450)
RBC: 3.91 M/uL (ref 3.80–5.70)
RDW-SD: 43.2 (ref 35.1–43.9)
WBC: 12.8 10*3/uL — ABNORMAL HIGH (ref 4.0–11.0)

## 2021-02-27 MED FILL — METOPROLOL TARTRATE 100 MG TAB: 100 mg | ORAL | Qty: 1

## 2021-02-27 MED FILL — THIAMINE HCL 100 MG TAB: 100 mg | ORAL | Qty: 1

## 2021-02-27 MED FILL — VITAMIN B-12  2,500 MCG SUBLINGUAL TABLET: 2500 mcg | SUBLINGUAL | Qty: 1

## 2021-02-27 MED FILL — HYDRALAZINE 50 MG TAB: 50 mg | ORAL | Qty: 1

## 2021-02-27 MED FILL — HYDRALAZINE 20 MG/ML IJ SOLN: 20 mg/mL | INTRAMUSCULAR | Qty: 1

## 2021-02-27 MED FILL — LEVETIRACETAM 500 MG TAB: 500 mg | ORAL | Qty: 1

## 2021-02-27 MED FILL — ADMELOG U-100 INSULIN LISPRO 100 UNIT/ML SUBCUTANEOUS SOLUTION: 100 unit/mL | SUBCUTANEOUS | Qty: 1

## 2021-02-27 MED FILL — ROSUVASTATIN 10 MG TAB: 10 mg | ORAL | Qty: 1

## 2021-02-27 MED FILL — AMLODIPINE 5 MG TAB: 5 mg | ORAL | Qty: 2

## 2021-02-27 MED FILL — PANTOPRAZOLE 40 MG TAB, DELAYED RELEASE: 40 mg | ORAL | Qty: 1

## 2021-02-27 NOTE — Progress Notes (Signed)
Spoke with Emmi at [facility]. She states they do not have a bed available and wont until at least early next week. Sent referrals to facilities in [place], [place] and [place].

## 2021-02-27 NOTE — Progress Notes (Signed)
Progress Note    Patient: Thomas Jackson   Age:  50 y.o.  DOA: 02/24/2021   Admit Dx / CC: LOS:  LOS: 3 days     Active Problems   1.  Acute metabolic encephalopathy multifactorial  2.  Acute right thalamic hemorrhage  3.  Hypertension  4.  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  CKD stage IV  6.  Vitamin D deficiency  7.  Seizure disorder  8.  Hyperlipidemia  9.  History of previous stroke  Plan:   1.  Patient seen and examined.  Patient still very confused and has both expressive and receptive  Aphasia.  Due to borderline elevated potassium level diet was changed to low potassium diet  Patient keeps removing his IV.  We will keep the IV out  2.  PT/OT and speech therapy consults were done  3.  Patient will be continued on thiamine folic acid and Z61 supplement  4.  For seizure disorder patient be continued on Keppra 500 mg p.o. twice daily  5.  Case manager consult done to assist with discharge planning.  Patient will benefit from skilled nursing facility and possibly long-term care  Subjective:   Unable to obtain any reasonable history    Objective:     Visit Vitals  BP (!) 155/98 (BP 1 Location: Left arm, BP Patient Position: Supine)   Pulse 73   Temp 98.1 ??F (36.7 ??C)   Resp 17   Ht 5' 4"  (1.626 m)   Wt 115.8 kg (255 lb 4.7 oz)   SpO2 99%   BMI 43.82 kg/m??       Physical Exam:  General appearance: Middle-age male well-built sitting comfortably no acute distress  Lungs: CTA bilaterally  Heart: Regular rhythm, no murmur  Abdomen: soft, distended, non-tender. Bowel sounds Audible,  Extremities: No edema, distal pulses palpable  Skin: Warm and dry  Neurologic:Awake and alert oriented to person only  Following simple commands, able to move all 4 extremities, gait not checked    Intake and Output:  Current Shift:  No intake/output data recorded.  Last three shifts:  07/19 1901 - 07/21 0700  In: 2333.1 [P.O.:1605; I.V.:728.1]  Out: 375 [Urine:375]    Lab/Data Reviewed:  Recent Results  (from the past 48 hour(s))   GLUCOSE, POC    Collection Time: 02/25/21  9:07 PM   Result Value Ref Range    Glucose (POC) 111 (H) 65 - 096 mg/dL   METABOLIC PANEL, COMPREHENSIVE    Collection Time: 02/26/21 12:30 AM   Result Value Ref Range    Potassium 4.8 (H) 3.4 - 4.5 mEq/L    Chloride 108 (H) 98 - 107 mEq/L    Sodium 139 136 - 145 mEq/L    CO2 23 20 - 31 mEq/L    Glucose 101 74 - 106 mg/dl    BUN 43 (H) 9 - 23 mg/dl    Creatinine 3.80 (H) 0.70 - 1.30 mg/dl    GFR est AA 22.0      GFR est non-AA 18      Calcium 8.7 8.7 - 10.4 mg/dl    Anion gap 8 5 - 15 mmol/L    AST (SGOT) 16.0 0.0 - 33.9 U/L    ALT (SGPT) 19 10 - 49 U/L    Alk. phosphatase 102 46 - 116 U/L    Bilirubin, total 0.40 0.30 - 1.20 mg/dl    Protein, total 6.1 5.7 - 8.2 gm/dl  Albumin 3.0 (L) 3.4 - 5.0 gm/dl   CBC W/O DIFF    Collection Time: 02/26/21 12:30 AM   Result Value Ref Range    WBC 12.9 (H) 4.0 - 11.0 1000/mm3    RBC 3.80 3.80 - 5.70 M/uL    HGB 12.3 (L) 12.4 - 17.2 gm/dl    HCT 36.0 (L) 37.0 - 50.0 %    MCV 94.7 80.0 - 98.0 fL    MCH 32.4 23.0 - 34.6 pg    MCHC 34.2 30.0 - 36.0 gm/dl    PLATELET 360 140 - 450 1000/mm3    MPV 9.2 6.0 - 10.0 fL    RDW-SD 42.3 35.1 - 43.9     MAGNESIUM    Collection Time: 02/26/21 12:30 AM   Result Value Ref Range    Magnesium 1.6 1.6 - 2.6 mg/dL   PHOSPHORUS    Collection Time: 02/26/21 12:30 AM   Result Value Ref Range    Phosphorus 4.9 2.4 - 5.1 mg/dL   GLUCOSE, POC    Collection Time: 02/26/21  8:10 AM   Result Value Ref Range    Glucose (POC) 89 65 - 105 mg/dL   GLUCOSE, POC    Collection Time: 02/26/21 12:10 PM   Result Value Ref Range    Glucose (POC) 149 (H) 65 - 105 mg/dL   GLUCOSE, POC    Collection Time: 02/26/21  4:09 PM   Result Value Ref Range    Glucose (POC) 136 (H) 65 - 105 mg/dL   GLUCOSE, POC    Collection Time: 02/26/21  6:10 PM   Result Value Ref Range    Glucose (POC) 128 (H) 65 - 105 mg/dL   GLUCOSE, POC    Collection Time: 02/26/21  9:13 PM   Result Value Ref Range    Glucose (POC)  108 (H) 65 - 517 mg/dL   METABOLIC PANEL, COMPREHENSIVE    Collection Time: 02/27/21 12:23 AM   Result Value Ref Range    Potassium 4.8 (H) 3.4 - 4.5 mEq/L    Chloride 106 98 - 107 mEq/L    Sodium 136 136 - 145 mEq/L    CO2 20 20 - 31 mEq/L    Glucose 111 (H) 74 - 106 mg/dl    BUN 44 (H) 9 - 23 mg/dl    Creatinine 3.89 (H) 0.70 - 1.30 mg/dl    GFR est AA 21.0      GFR est non-AA 18      Calcium 8.6 (L) 8.7 - 10.4 mg/dl    Anion gap 10 5 - 15 mmol/L    AST (SGOT) 14.0 0.0 - 33.9 U/L    ALT (SGPT) 16 10 - 49 U/L    Alk. phosphatase 98 46 - 116 U/L    Bilirubin, total 0.30 0.30 - 1.20 mg/dl    Protein, total 6.1 5.7 - 8.2 gm/dl    Albumin 3.1 (L) 3.4 - 5.0 gm/dl   CBC W/O DIFF    Collection Time: 02/27/21 12:23 AM   Result Value Ref Range    WBC 12.8 (H) 4.0 - 11.0 1000/mm3    RBC 3.91 3.80 - 5.70 M/uL    HGB 12.5 12.4 - 17.2 gm/dl    HCT 36.6 (L) 37.0 - 50.0 %    MCV 93.6 80.0 - 98.0 fL    MCH 32.0 23.0 - 34.6 pg    MCHC 34.2 30.0 - 36.0 gm/dl    PLATELET 373 140 - 450 1000/mm3  MPV 9.3 6.0 - 10.0 fL    RDW-SD 43.2 35.1 - 43.9     GLUCOSE, POC    Collection Time: 02/27/21  8:10 AM   Result Value Ref Range    Glucose (POC) 97 65 - 105 mg/dL   GLUCOSE, POC    Collection Time: 02/27/21 12:45 PM   Result Value Ref Range    Glucose (POC) 177 (H) 65 - 105 mg/dL   GLUCOSE, POC    Collection Time: 02/27/21  5:54 PM   Result Value Ref Range    Glucose (POC) 102 65 - 105 mg/dL       Imaging:  No results found.      Medications Reviewed:  Current Facility-Administered Medications   Medication Dose Route Frequency    hydrALAZINE (APRESOLINE) tablet 50 mg  50 mg Oral TID    metoprolol tartrate (LOPRESSOR) tablet 100 mg  100 mg Oral BID    thiamine HCL (B-1) tablet 100 mg  100 mg Oral DAILY    cyanocobalamin (VITAMIN B12) sublingual tablet 2,500 mcg  2,500 mcg SubLINGual DAILY    ergocalciferol capsule 50,000 Units  50,000 Units Oral Q7D    dextrose (D50W) injection syrg 5-25 g  10-50 mL IntraVENous PRN    insulin glargine  (LANTUS) injection 1-100 Units  1-100 Units SubCUTAneous QHS    insulin lispro (HUMALOG) injection 1-100 Units  1-100 Units SubCUTAneous AC&HS    insulin lispro (HUMALOG) injection 1-100 Units  1-100 Units SubCUTAneous PRN    hydrALAZINE (APRESOLINE) 20 mg/mL injection 20 mg  20 mg IntraVENous Q6H PRN    amLODIPine (NORVASC) tablet 10 mg  10 mg Oral DAILY    glucagon (GLUCAGEN) injection 1 mg  1 mg IntraMUSCular PRN    levETIRAcetam (KEPPRA) tablet 500 mg  500 mg Oral BID    pantoprazole (PROTONIX) tablet 40 mg  40 mg Oral ACB    naloxone (NARCAN) injection 0.1 mg  0.1 mg IntraVENous PRN    ondansetron (ZOFRAN) injection 4 mg  4 mg IntraVENous Q4H PRN    rosuvastatin (CRESTOR) tablet 10 mg  10 mg Oral QHS         Dragon medical dictation software was used for portions of this report. Unintended errors may occur.   Elon Spanner, MD  P# [phone]  February 27, 2021

## 2021-02-27 NOTE — Progress Notes (Signed)
 SPEECH LANGUAGE PATHOLOGY   SPEECH- LANGUAGE TREATMENT     Patient: Thomas Jackson (49 y.o. 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  Patient attempted to name objects in room with 1 out of 6 correct. Phonemic, carrier phrases and choices were unsuccessful. Patient had difficulty repeating name of objects. Patient was able to count 1-9, but when looking at a clock he began to perseverate on the number 8. He was unable to name days of the week with modeling and with written word. When asked simple y/n questions he became confused as what the tasks was and started counting. Again, simple directions were also very difficult. Patient presents with severe expressive and receptive aphasia. Conversational speech had many paraphasias with Wernicke like characteristics.        Aphasia Severity Rating: ASR 1- The individual may occasionally produce words or phrases that are meaningful in context, but communication is fragmentary and not possible without significant help from the listener, e.g. guessing, questioning.  [place] is very 'one sided' with the listener bearing almost all of the burden.  An extremely limited amount or range of information is exchanged.  Misunderstandings or failed communications are frequent    PLAN/ RECOMMENDATIONS:     Therapy Recommendations: pt would benefit from continuation of SLP as per POC    Therapy Prognosis: good and fair    Discharge Recommendations: IPR  Other Recommended Services: OT, PT     SUBJECTIVE:    Patient Stated: Yep.   Pain Prior to Treatment: no pain reported   Pain Following Treatment: no pain  reported     OBJECTIVE:    See above treatment part of note for details    Safety: Following session, pt tray table, phone, and call bell within reach   Modified Rankin Score (MRS): Defer to OT/ PT    EDUCATION:   Education Provided: POC and compensatory strategies for basic communication  Individual Educated: patient and nurse  Comprehension: pt unable to communicate comprehension and no evidence of learning       Thank you,    Sherran Boys MS, CCC-SLP CBIS  Speech Language Pathologist  Clinical Brain Injury Specialist  [phone]

## 2021-02-27 NOTE — Progress Notes (Signed)
MD advised patient pulled out another IV. MD advised access no longer needed.

## 2021-02-28 LAB — POCT GLUCOSE
POC Glucose: 193 mg/dL — ABNORMAL HIGH (ref 65–105)
POC Glucose: 109 mg/dL — ABNORMAL HIGH (ref 65–105)
POC Glucose: 113 mg/dL — ABNORMAL HIGH (ref 65–105)
POC Glucose: 106 mg/dL — ABNORMAL HIGH (ref 65–105)

## 2021-02-28 LAB — GLUCOSE, POC
Glucose (POC): 193 mg/dL — ABNORMAL HIGH (ref 65–105)
Glucose (POC): 109 mg/dL — ABNORMAL HIGH (ref 65–105)
Glucose (POC): 113 mg/dL — ABNORMAL HIGH (ref 65–105)
Glucose (POC): 106 mg/dL — ABNORMAL HIGH (ref 65–105)

## 2021-02-28 MED FILL — LEVETIRACETAM 500 MG TAB: 500 mg | ORAL | Qty: 1

## 2021-02-28 MED FILL — HYDRALAZINE 50 MG TAB: 50 mg | ORAL | Qty: 1

## 2021-02-28 MED FILL — METOPROLOL TARTRATE 100 MG TAB: 100 mg | ORAL | Qty: 1

## 2021-02-28 MED FILL — PANTOPRAZOLE 40 MG TAB, DELAYED RELEASE: 40 mg | ORAL | Qty: 1

## 2021-02-28 MED FILL — ROSUVASTATIN 10 MG TAB: 10 mg | ORAL | Qty: 1

## 2021-02-28 MED FILL — VITAMIN B-12  2,500 MCG SUBLINGUAL TABLET: 2500 mcg | SUBLINGUAL | Qty: 1

## 2021-02-28 MED FILL — VITAMIN B-1 100 MG TABLET: 100 mg | ORAL | Qty: 1

## 2021-02-28 MED FILL — AMLODIPINE 5 MG TAB: 5 mg | ORAL | Qty: 2

## 2021-02-28 NOTE — Progress Notes (Signed)
 SPEECH LANGUAGE PATHOLOGY   SPEECH- LANGUAGE TREATMENT     Patient: Thomas Jackson (49 y.o. 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 Upon SLP's entrance to room, pt awake/alert, sitting upright in bed, watching television. Pt initially able to answer simple yes/no questions w/50% accuracy independently. However, when provided clear, concise directions with choice within field of two and visual supports, accuracy increased to 75%. Pt initially unable to state his name when queried. Phonemic cues were not beneficial. SLP provided choice within field of two for response, scaffolding question to provide correct response in final choice position, I.e., Is your name Garrel or Consuelo? Pt demonstrated significant comprehension deficits and bewilderedly stated, I don't know. SLP reoriented pt to self and provided verbal model of his name. Pt then repeated name. SLP trialed stimulability for Spaced Retrieval technique for targeting anomia. Pt able to recall name in 9/10 instances independently, with increasing duration of time and increasing complexity of task by introducing cognitive shifting between each response. During trial 7/10, pt was able to produce first phoneme of name but not full name despite being provided additional time. SLP provided further phonemic cues; following which, pt stated his name. Perseverations present throughout ST session. Pt continues to present with severe mixed expressive/receptive aphasia. Conversational speech w/many paraphasias and Wernicke-like characteristics.     Aphasia Severity Rating: ASR 1- The individual may  occasionally produce words or phrases that are meaningful in context, but communication is fragmentary and not possible without significant help from the listener, e.g. guessing, questioning.  [place] is very 'one sided' with the listener bearing almost all of the burden.  An extremely limited amount or range of information is exchanged.  Misunderstandings or failed communications are frequent    PLAN/ RECOMMENDATIONS:     Therapy Recommendations: pt would benefit from continuation of SLP as per POC    Therapy Prognosis: fair    Discharge Recommendations: continue services upon discharge  Other Recommended Services: OT, PT     SUBJECTIVE:    Patient Stated: Duke, my dog.   Pain Prior to Treatment: no pain reported   Pain Following Treatment: no pain reported     OBJECTIVE:    Receptive/ Expressive Language Skills:    See Plan of Care    Safety: Following session, pt in bed, lowest position, HOB >45*, 4 side rails up, bed alarm on   Modified Rankin Score (MRS): Defer to OT/ PT    EDUCATION:   Education Provided: POC and compensatory strategies for speech  Individual Educated: patient and nurse  Comprehension: query pt's level carry over   Staff Education: Educated Nurse to POC and compensatory strategies for speech.     Thank you,    Sari Mandril, M.S., CCC-SLP  Speech-Language Pathologist  Office:  [phone] or by Secure Chat

## 2021-02-28 NOTE — Progress Notes (Signed)
Progress Note    Patient: Thomas Jackson   Age:  50 y.o.  DOA: 02/24/2021   Admit Dx / CC: LOS:  LOS: 4 days     Active Problems   1.  Acute metabolic encephalopathy multifactorial  2.  Acute right thalamic hemorrhage  3.  Hypertension  4.  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  CKD stage IV  6.  Vitamin D deficiency  7.  Seizure disorder  8.  Hyperlipidemia  9.  History of previous stroke  Plan:   1.  Patient seen and examined.  Patient still very confused and has both expressive and receptive  Aphasia.    2.  PT/OT and speech therapy consults were done  3.  Patient will be continued on thiamine folic acid and N23 supplement  4.  For seizure disorder patient be continued on Keppra 500 mg p.o. twice daily  5.  Case manager consult done to assist with discharge planning.  Patient is medically stable to be discharged to skilled nursing facility  Subjective:   Unable to obtain any reasonable history    Objective:     Visit Vitals  BP (!) 149/75 (BP 1 Location: Right arm, BP Patient Position: Sitting)   Pulse 70   Temp 97.9 ??F (36.6 ??C)   Resp 16   Ht 5' 4"  (1.626 m)   Wt 115.8 kg (255 lb 4.7 oz)   SpO2 96%   BMI 43.82 kg/m??       Physical Exam:  General appearance: Middle-age male well-built sitting comfortably no acute distress  Neurologic:Awake and alert oriented to person only  Following simple commands, able to move all 4 extremities, gait not checked    Intake and Output:  Current Shift:  No intake/output data recorded.  Last three shifts:  07/20 1901 - 07/22 0700  In: 727 [P.O.:727]  Out: -     Lab/Data Reviewed:  Recent Results (from the past 48 hour(s))   GLUCOSE, POC    Collection Time: 02/26/21  9:13 PM   Result Value Ref Range    Glucose (POC) 108 (H) 65 - 557 mg/dL   METABOLIC PANEL, COMPREHENSIVE    Collection Time: 02/27/21 12:23 AM   Result Value Ref Range    Potassium 4.8 (H) 3.4 - 4.5 mEq/L    Chloride 106 98 - 107 mEq/L    Sodium 136 136 - 145 mEq/L    CO2 20 20 - 31  mEq/L    Glucose 111 (H) 74 - 106 mg/dl    BUN 44 (H) 9 - 23 mg/dl    Creatinine 3.89 (H) 0.70 - 1.30 mg/dl    GFR est AA 21.0      GFR est non-AA 18      Calcium 8.6 (L) 8.7 - 10.4 mg/dl    Anion gap 10 5 - 15 mmol/L    AST (SGOT) 14.0 0.0 - 33.9 U/L    ALT (SGPT) 16 10 - 49 U/L    Alk. phosphatase 98 46 - 116 U/L    Bilirubin, total 0.30 0.30 - 1.20 mg/dl    Protein, total 6.1 5.7 - 8.2 gm/dl    Albumin 3.1 (L) 3.4 - 5.0 gm/dl   CBC W/O DIFF    Collection Time: 02/27/21 12:23 AM   Result Value Ref Range    WBC 12.8 (H) 4.0 - 11.0 1000/mm3    RBC 3.91 3.80 - 5.70 M/uL    HGB 12.5 12.4 -  17.2 gm/dl    HCT 36.6 (L) 37.0 - 50.0 %    MCV 93.6 80.0 - 98.0 fL    MCH 32.0 23.0 - 34.6 pg    MCHC 34.2 30.0 - 36.0 gm/dl    PLATELET 373 140 - 450 1000/mm3    MPV 9.3 6.0 - 10.0 fL    RDW-SD 43.2 35.1 - 43.9     GLUCOSE, POC    Collection Time: 02/27/21  8:10 AM   Result Value Ref Range    Glucose (POC) 97 65 - 105 mg/dL   GLUCOSE, POC    Collection Time: 02/27/21 12:45 PM   Result Value Ref Range    Glucose (POC) 177 (H) 65 - 105 mg/dL   GLUCOSE, POC    Collection Time: 02/27/21  5:54 PM   Result Value Ref Range    Glucose (POC) 102 65 - 105 mg/dL   GLUCOSE, POC    Collection Time: 02/27/21  9:42 PM   Result Value Ref Range    Glucose (POC) 193 (H) 65 - 105 mg/dL   GLUCOSE, POC    Collection Time: 02/28/21  9:21 AM   Result Value Ref Range    Glucose (POC) 109 (H) 65 - 105 mg/dL   GLUCOSE, POC    Collection Time: 02/28/21 11:55 AM   Result Value Ref Range    Glucose (POC) 113 (H) 65 - 105 mg/dL   GLUCOSE, POC    Collection Time: 02/28/21  4:46 PM   Result Value Ref Range    Glucose (POC) 106 (H) 65 - 105 mg/dL       Imaging:  No results found.      Medications Reviewed:  Current Facility-Administered Medications   Medication Dose Route Frequency    hydrALAZINE (APRESOLINE) tablet 50 mg  50 mg Oral TID    metoprolol tartrate (LOPRESSOR) tablet 100 mg  100 mg Oral BID    thiamine HCL (B-1) tablet 100 mg  100 mg Oral DAILY     cyanocobalamin (VITAMIN B12) sublingual tablet 2,500 mcg  2,500 mcg SubLINGual DAILY    ergocalciferol capsule 50,000 Units  50,000 Units Oral Q7D    dextrose (D50W) injection syrg 5-25 g  10-50 mL IntraVENous PRN    insulin glargine (LANTUS) injection 1-100 Units  1-100 Units SubCUTAneous QHS    insulin lispro (HUMALOG) injection 1-100 Units  1-100 Units SubCUTAneous AC&HS    insulin lispro (HUMALOG) injection 1-100 Units  1-100 Units SubCUTAneous PRN    hydrALAZINE (APRESOLINE) 20 mg/mL injection 20 mg  20 mg IntraVENous Q6H PRN    amLODIPine (NORVASC) tablet 10 mg  10 mg Oral DAILY    glucagon (GLUCAGEN) injection 1 mg  1 mg IntraMUSCular PRN    levETIRAcetam (KEPPRA) tablet 500 mg  500 mg Oral BID    pantoprazole (PROTONIX) tablet 40 mg  40 mg Oral ACB    naloxone (NARCAN) injection 0.1 mg  0.1 mg IntraVENous PRN    ondansetron (ZOFRAN) injection 4 mg  4 mg IntraVENous Q4H PRN    rosuvastatin (CRESTOR) tablet 10 mg  10 mg Oral QHS         Dragon medical dictation software was used for portions of this report. Unintended errors may occur.   Elon Spanner, MD  P# [phone]  February 28, 2021

## 2021-02-28 NOTE — Progress Notes (Signed)
Spoke with Thomas Jackson at [facility].  They have beds and will review pts referral for  possible admission. Emailed clinicals to her.

## 2021-02-28 NOTE — Progress Notes (Signed)
 PHYSICAL THERAPY TREATMENT:     Patient: Thomas Jackson (50 y.o. male)  Room: 6608/6608    Primary Diagnosis: Thalamic hemorrhage (HCC) [I61.0]       Length of Stay:  4 day(s)   Insurance: Payor: [facility] / Plan: [facility] MEDICARE A & B / Product Type: Medicare /      Date: 02/28/2021  In time: 8:49  Out time:  9:27     Precautions: Falls.      Isolation:  There are currently no Active Isolations       MDRO: No active infections     Equipment: gait belt      Assessment:      Based on the objective data described below, the patient presents with  - Pt found sitting on edge of bed with all 4 rails up +bed alarm.   - Pt able to tolerate EOB for vitals and glucose testing. Displays good core strength.   - Pt continues to have difficulty with expressive aphasia though has improved as he was able to hold short sentence conversation at beginning of session. By end of session aphasia seemed to regress  - Good tolerance to ambulation in room to restroom. Pt declined walking further today due to increased fatigue. He also declined RW; assisted with CGA. Would benefit from UE support due to right sided unsteadiness   - Good effort to seated therapeutic exercises. Pt with much more difficulty with heel raises and LAQ on R>L. Pt also has difficulty following instructions.   - ok motivation to participate in PT to return to prior level of function. Required some motivation to participate in PT this date.       Modified Rankin Score (mRS): 3 - Moderate disability; requires some help but able to walk without (human) assistance - cane, walker etc.         Recommendations:  Recommend continued physical therapy during acute stay. Physical Therapy and Occupational Therapy. Recommend out of bed activity to counteract ill effects of bedrest, with assistance from staff as needed.  Discharge Recommendations: Skilled nursing facility (SNF).  Further Equipment Recommendations for Discharge: DME needs to be determined at time of d/c from  rehab.     Plan:      Patient will be followed by physical therapy to address goals per initial plan of care.    Subjective:      Patient agreeable to PT with some motivation. States that he feels tired and his right leg feels funny when he walks.     Objective Data Summary:      Orders, labs, and chart reviewed on Thomas Jackson. Communicated with patient's nurse (patient ok to be seen by PT).    Patient found:     Received patient sitting at edge of bed. (+) bed alarm. Breif, telemetry.    Cognitive Status:     Mental Status: Alert but with difficulty expressing his words. Could not repeat name and birthday but said yes that's it! When repeated to him.     Safety/Judgement: needs cueing for safety and precautions.    Therapeutic Activities; Functional Mobility and Balance Status:      Focus on bed mobility training, gait training, therapeutic exercises, therapeutic activities. Provided cueing for hand placement, technique, and safety.    Bed mobility:  Functional Status Comments   Supine to sit Pt received already sitting up in bed      Sit to supine, HOB slightly raised with use of bed rails standby  assist requires increased time   Scooting min assist      Transfers:  Functional Status Comments   Sit to stand contact guard provided cueing for hand placement, technique, and safety   Stand to sit contact guard provided cueing for hand placement, technique, and safety           Gait Training:     Distance  10+10 feet to restroom and back, + some ambulation in room.     Analysis  unsteady, decreased walking speed, decreased cadence, downward gaze, impaired balance    Assistance  contact guard and cueing for obstacle avoidance, safety, and safety with turns    DME  gait belt; pt declined RW but would benefit from RW at this time due to right sided weakness. Assisted with CGA and GB        Functional Balance Sitting:    Good static: patient able to maintain balance without handhold support, limited postural sway     Good dynamic: patient accepts moderate challenge      Functional Balance Standing:    Static standing: fair (-): contact guard assistance to maintain balance    Dynamic standing: fair (-): able to perform partial UE ROM ranges with CGA     Therapeutic Exercises:       Therapeutic exercises (Rationale: increase ROM, increase strength, improve coordination to improve the patient's functional mobility):     5-10 reps each:   heel raises in sitting, long arc quads (LAQ), seated marches      Balance activities (Rationale: increase strength, improve coordination, improve balance and increase proprioception to improve the patient's ability to perform ADLs):    Worked to improve reactive postural responses: stable surface, standing, sitting at edge of bed.    Required UE support and cues for safety and technique.        Activity Tolerance:     - requires increased time, rest breaks and repeated attempts with functional tasks  - no apparent distress  - BP 163/82 at end of session.     Final Location:     Positioned in bed, all needs within reach. Patient agrees to call for assistance. Patient set up with tray. (+) bed alarm. (+) nurse notified. (+) nurse present.    Communication/Education:     Education: benefits of activity, OOB with assist from staff, activity pacing, safety, HEP.    Education provided to: patient   Opportunity for questions and clarification was provided.    Readiness to learn indicated by: trying to perform skills and showing interest    Barriers to learning/limitations:  Yes;  aphasia; some difficulty following instructions    Comprehension: Patient communicated comprehension      Thank you for this referral.  Rosina Herald, PTA

## 2021-03-01 LAB — POCT GLUCOSE
POC Glucose: 109 mg/dL — ABNORMAL HIGH (ref 65–105)
POC Glucose: 90 mg/dL (ref 65–105)
POC Glucose: 103 mg/dL (ref 65–105)
POC Glucose: 176 mg/dL — ABNORMAL HIGH (ref 65–105)

## 2021-03-01 LAB — GLUCOSE, POC
Glucose (POC): 109 mg/dL — ABNORMAL HIGH (ref 65–105)
Glucose (POC): 90 mg/dL (ref 65–105)
Glucose (POC): 103 mg/dL (ref 65–105)
Glucose (POC): 176 mg/dL — ABNORMAL HIGH (ref 65–105)

## 2021-03-01 MED ORDER — LORAZEPAM 0.5 MG TAB
0.5 mg | Freq: Once | ORAL | Status: AC
Start: 2021-03-01 — End: 2021-03-01
  Administered 2021-03-01: 10:00:00 via ORAL

## 2021-03-01 MED FILL — LEVETIRACETAM 500 MG TAB: 500 mg | ORAL | Qty: 1

## 2021-03-01 MED FILL — VITAMIN B-12  2,500 MCG SUBLINGUAL TABLET: 2500 mcg | SUBLINGUAL | Qty: 1

## 2021-03-01 MED FILL — HYDRALAZINE 50 MG TAB: 50 mg | ORAL | Qty: 1

## 2021-03-01 MED FILL — AMLODIPINE 5 MG TAB: 5 mg | ORAL | Qty: 2

## 2021-03-01 MED FILL — ROSUVASTATIN 10 MG TAB: 10 mg | ORAL | Qty: 1

## 2021-03-01 MED FILL — LORAZEPAM 0.5 MG TAB: 0.5 mg | ORAL | Qty: 1

## 2021-03-01 MED FILL — METOPROLOL TARTRATE 100 MG TAB: 100 mg | ORAL | Qty: 1

## 2021-03-01 MED FILL — PANTOPRAZOLE 40 MG TAB, DELAYED RELEASE: 40 mg | ORAL | Qty: 1

## 2021-03-01 MED FILL — VITAMIN B-1 100 MG TABLET: 100 mg | ORAL | Qty: 1

## 2021-03-01 NOTE — Progress Notes (Signed)
Problem: Pressure Injury - Risk of  Goal: *Prevention of pressure injury  Description: Document Braden Scale and appropriate interventions in the flowsheet.  03/01/2021 2236 by Barrie Folk, RN  Outcome: Progressing Towards Goal  Note: Pressure Injury Interventions:  Sensory Interventions: Assess changes in LOC    Moisture Interventions: Absorbent underpads    Activity Interventions: Pressure redistribution bed/mattress(bed type)    Mobility Interventions: HOB 30 degrees or less    Nutrition Interventions: Document food/fluid/supplement intake    Friction and Shear Interventions: Apply protective barrier, creams and emollients             03/01/2021 2236 by Barrie Folk, RN  Outcome: Progressing Towards Goal  Note: Pressure Injury Interventions:  Sensory Interventions: Assess changes in LOC    Moisture Interventions: Absorbent underpads    Activity Interventions: Pressure redistribution bed/mattress(bed type)    Mobility Interventions: HOB 30 degrees or less    Nutrition Interventions: Document food/fluid/supplement intake    Friction and Shear Interventions: Apply protective barrier, creams and emollients                Problem: Patient Education: Go to Patient Education Activity  Goal: Patient/Family Education  03/01/2021 2236 by Barrie Folk, RN  Outcome: Progressing Towards Goal  03/01/2021 2236 by Barrie Folk, RN  Outcome: Progressing Towards Goal     Problem: Falls - Risk of  Goal: *Absence of Falls  Description: Document Bridgette Habermann Fall Risk and appropriate interventions in the flowsheet.  03/01/2021 2236 by Barrie Folk, RN  Outcome: Progressing Towards Goal  Note: Fall Risk Interventions:  Mobility Interventions: Bed/chair exit alarm    Mentation Interventions: Bed/chair exit alarm    Medication Interventions: Bed/chair exit alarm    Elimination Interventions: Call light in reach    History of Falls Interventions: Bed/chair exit alarm      03/01/2021 2236 by Barrie Folk, RN  Outcome: Progressing  Towards Goal  Note: Fall Risk Interventions:  Mobility Interventions: Bed/chair exit alarm    Mentation Interventions: Bed/chair exit alarm    Medication Interventions: Bed/chair exit alarm    Elimination Interventions: Call light in reach    History of Falls Interventions: Bed/chair exit alarm         Problem: Patient Education: Go to Patient Education Activity  Goal: Patient/Family Education  03/01/2021 2236 by Barrie Folk, RN  Outcome: Progressing Towards Goal  03/01/2021 2236 by Barrie Folk, RN  Outcome: Progressing Towards Goal

## 2021-03-01 NOTE — Progress Notes (Signed)
Pt transferred along with belonging from room 6608 to room 6614.

## 2021-03-01 NOTE — Progress Notes (Signed)
Progress Note    Patient: Thomas Jackson   Age:  50 y.o.  DOA: 02/24/2021   Admit Dx / CC: LOS:  LOS: 5 days     Active Problems   1.  Acute metabolic encephalopathy multifactorial  2.  Acute right thalamic hemorrhage  3.  Hypertension  4.  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  CKD stage IV  6.  Vitamin D deficiency  7.  Seizure disorder  8.  Hyperlipidemia  9.  History of previous stroke  Plan:   1.  Patient seen and examined.  Patient still very confused and has both expressive and receptive  Aphasia.    2.  PT/OT and speech therapy consults were done  3.  Patient will be continued on thiamine folic acid and B12 supplement  4.  For seizure disorder patient be continued on Keppra 500 mg p.o. twice daily  5.  Case manager consult done to assist with discharge planning.  Patient is medically stable to be discharged to skilled nursing facility  Subjective:   Unable to obtain any reasonable history    Objective:     Visit Vitals  BP (!) 150/69 (BP 1 Location: Left arm, BP Patient Position: Supine)   Pulse 69   Temp 98.7 ??F (37.1 ??C)   Resp 16   Ht 5\' 4"  (1.626 m)   Wt 115.8 kg (255 lb 4.7 oz)   SpO2 100%   BMI 43.82 kg/m??       Physical Exam:  General appearance: Middle-age male well-built sitting comfortably no acute distress eating breakfast  Neurologic:Awake and alert oriented to person only  Following simple commands, able to move all 4 extremities, gait not checked    Intake and Output:  Current Shift:  07/23 0701 - 07/23 1900  In: -   Out: 1   Last three shifts:  07/21 1901 - 07/23 0700  In: 237 [P.O.:237]  Out: -     Lab/Data Reviewed:  Recent Results (from the past 48 hour(s))   GLUCOSE, POC    Collection Time: 02/27/21  5:54 PM   Result Value Ref Range    Glucose (POC) 102 65 - 105 mg/dL   GLUCOSE, POC    Collection Time: 02/27/21  9:42 PM   Result Value Ref Range    Glucose (POC) 193 (H) 65 - 105 mg/dL   GLUCOSE, POC    Collection Time: 02/28/21  9:21 AM   Result Value Ref  Range    Glucose (POC) 109 (H) 65 - 105 mg/dL   GLUCOSE, POC    Collection Time: 02/28/21 11:55 AM   Result Value Ref Range    Glucose (POC) 113 (H) 65 - 105 mg/dL   GLUCOSE, POC    Collection Time: 02/28/21  4:46 PM   Result Value Ref Range    Glucose (POC) 106 (H) 65 - 105 mg/dL   GLUCOSE, POC    Collection Time: 02/28/21  9:09 PM   Result Value Ref Range    Glucose (POC) 109 (H) 65 - 105 mg/dL   GLUCOSE, POC    Collection Time: 03/01/21  8:12 AM   Result Value Ref Range    Glucose (POC) 90 65 - 105 mg/dL   GLUCOSE, POC    Collection Time: 03/01/21 12:01 PM   Result Value Ref Range    Glucose (POC) 103 65 - 105 mg/dL   GLUCOSE, POC    Collection Time: 03/01/21  4:53 PM  Result Value Ref Range    Glucose (POC) 176 (H) 65 - 105 mg/dL       Imaging:  No results found.      Medications Reviewed:  Current Facility-Administered Medications   Medication Dose Route Frequency    hydrALAZINE (APRESOLINE) tablet 50 mg  50 mg Oral TID    metoprolol tartrate (LOPRESSOR) tablet 100 mg  100 mg Oral BID    thiamine HCL (B-1) tablet 100 mg  100 mg Oral DAILY    cyanocobalamin (VITAMIN B12) sublingual tablet 2,500 mcg  2,500 mcg SubLINGual DAILY    ergocalciferol capsule 50,000 Units  50,000 Units Oral Q7D    dextrose (D50W) injection syrg 5-25 g  10-50 mL IntraVENous PRN    insulin glargine (LANTUS) injection 1-100 Units  1-100 Units SubCUTAneous QHS    insulin lispro (HUMALOG) injection 1-100 Units  1-100 Units SubCUTAneous AC&HS    insulin lispro (HUMALOG) injection 1-100 Units  1-100 Units SubCUTAneous PRN    hydrALAZINE (APRESOLINE) 20 mg/mL injection 20 mg  20 mg IntraVENous Q6H PRN    amLODIPine (NORVASC) tablet 10 mg  10 mg Oral DAILY    glucagon (GLUCAGEN) injection 1 mg  1 mg IntraMUSCular PRN    levETIRAcetam (KEPPRA) tablet 500 mg  500 mg Oral BID    pantoprazole (PROTONIX) tablet 40 mg  40 mg Oral ACB    naloxone (NARCAN) injection 0.1 mg  0.1 mg IntraVENous PRN    ondansetron (ZOFRAN) injection 4 mg  4 mg IntraVENous  Q4H PRN    rosuvastatin (CRESTOR) tablet 10 mg  10 mg Oral QHS         Dragon medical dictation software was used for portions of this report. Unintended errors may occur.   Hazel Sams, MD  P# [phone]  March 01, 2021

## 2021-03-01 NOTE — Progress Notes (Signed)
Dr. Tasia Catchings. Rm 6614: Pt restless, getting out of bed, taking off dressing and equipment. May we please have prn medication to help pt relax?

## 2021-03-01 NOTE — Progress Notes (Signed)
SPEECH LANGUAGE PATHOLOGY   SPEECH- LANGUAGE TREATMENT     Patient: Thomas Jackson (50 y.o. male) [birth year]  Room: 6614/6614  Primary Diagnosis: Thalamic hemorrhage (HCC) [I61.0]          Isolation:  There are currently no Active Isolations       MDRO: No current active infections  PPE: N95, gloves, face shield  Precautions:   universal and aspiration    Date: 03/01/2021   Start Time:  1415 End Time:  1453          TREATMENT:   Treatment:  Speech- language tx completed b/s with tasks listed below. Appreciated discussion w/RN in coordination of this pt's care. Pt received Ativan early this morning due to agitated behaviors and had been sleeping for most of the day. Pt awakened easily to repeated verbal stimuli. SLP facilitated elevating HOB. Pt w/o carryover of stating name today's date from yesterday's session. Mildly decreased wakefulness during today's session w/decreased receptive/expressive language abilities. Pt unable to participate in repetition or automatic speech tasks. Pt able to name 1/5 presented objects. 50% accuracy in answering simple yes/no questions. Pt not able to identify objects when provided choice within field of two. SLP attempted to continue with Spaced Retrieval technique presented yesterday's date; however, technique not beneficial today's date w/pt's increased drowsiness. Pt did demonstrate x1 instance of using gesture to facilitate word-finding, as evidenced by stating, "glug, glug, glug" with an expectant look, in absence of naming request. SLP responded with, "What are you looking for"? And provided expectant pause. Pt then produced gesture for drinking from a cup, followed by stating, "Water". SLP provided cup of water. Pt may benefit from trialing Gestural Facilitation of Naming for aphasia. Therapy session then concluded as pt became drowsy. SLP will follow accordingly.     Aphasia Severity Rating: ASR 1- The individual may occasionally produce words or phrases that are meaningful in  context, but communication is fragmentary and not possible without significant help from the listener, e.g. guessing, questioning.  Orlene Erm is very 'one sided' with the listener bearing almost all of the burden.  An extremely limited amount or range of information is exchanged.  Misunderstandings or failed communications are frequent    PLAN/ RECOMMENDATIONS:     Therapy Recommendations: pt would benefit from continuation of SLP as per POC    Therapy Prognosis: guarded  Discharge Recommendations: continue services upon discharge  Other Recommended Services: OT, PT     SUBJECTIVE:    Patient Stated: "Tired."   Pain Prior to Treatment: no pain reported   Pain Following Treatment: no pain reported     OBJECTIVE:    Receptive/ Expressive Language Skills:    See Plan of Care    Safety: Following session, pt in bed, lowest position, HOB >45*, 4 side rails up, bed alarm on   Modified Rankin Score (MRS): Defer to OT/ PT    EDUCATION:   Education Provided: POC and compensatory strategies for speech  Individual Educated: patient and nurse  Comprehension: pt unable to communicate comprehension   Staff Education: Educated Nurse to POC and compensatory strategies for speech.     Thank you,    Maud Deed, M.S., CCC-SLP  Speech-Language Pathologist  Office:  [phone] or by Secure Chat

## 2021-03-01 NOTE — Progress Notes (Signed)
OCCUPATIONAL THERAPY TREATMENT     Patient: Thomas Jackson (50 y.o. male)  Room: 6614/6614    Primary Diagnosis: Thalamic hemorrhage [facility] [I61.0]       Date of Admission: 02/24/2021   Insurance: Payor: [facility] / Plan: [facility] MEDICARE A & B / Product Type: Medicare /      Date: 03/01/2021  In time:  1410        Out time:  1503    Precautions: falls, decreased safety awareness, impulsive, aphasia   Ordered Weight Bearing Status: NA    Isolation:  There are currently no Active Isolations       MDRO: No active infections     ASSESSMENT:    Based on the objective data described below, the patient presents with     Pt seen for OT tx session with focus on ADL task performance, functional transfers, and functional mobility in room. Pt requires SBA/CGA with verbal cuing for all tasks including toileting, functional transfers, and grooming tasks. Pt demonstrates increased difficulty communicating, but progressing with yes/no responses and interacts appropriately. Pt demonstrates impulsive behaviors, walking out into hallway despite cuing to walk to sink for hygiene after toileting. Pt is pleasant and actively participates in all therapeutic intervention.      PLAN: Continue OT POC    Recommendations:  Recommend continued skilled occupational therapy intervention to address above impairments.  Recommend out of bed activity to counteract ill effects of bedrest, with assistance from staff as needed.    Discharge Recommendations:  IPR verses SNF , pending progress.  Further Equipment Recommendations for Discharge: to be determined, DME needs to be determined at time of d/c from rehab     Education/ communication:     Barriers to Learning/Limitations:  yes;  language and cognitive  Education provided to: patient on (+) role of OT, (+) OT plan of care, (+) Instructed patient in the benefits of maintaining activity tolerance, functional mobility, and independence with self care tasks during acute stay  to ensure safe return home  and to baseline. Encouraged patient to increase frequency and duration OOB, be out of bed for all meals, perform daily ADLs (as approved by RN/MD regarding bathing etc), and performing functional mobility to/from bathroom with staff assistance as needed., (+) instructed patient on the importance of activity while hospitalized to prevent a decline in function  Educational Handouts issued: none this session  Patient / Family readiness to learn indicated by: demonstrated understanding, needs reinforcement    SUBJECTIVE:     Patient drowsy throughout session.    OBJECTIVE DATA SUMMARY:     Orders, labs, occupational therapy and chart reviewed on Thomas Jackson. Discussed with Pt's nurse.    Patient found: Bed, (+) bed/chair exit alarm, (+) telemetry    Pain assessment: None observed and None reported  Pain location: N/A    Cognitive:   Mental status:   Neurostate: alert, drowsy, and increased time to rouse, General observations: interacts appropriately  Communication: impaired, expressive aphasia, receptive aphasia  Attention Span:   fair (15-90min)  Follows commands: interacts appropriately, delayed response, requires cues to stay on task    Activities of Daily Living:  -  Patient  demonstrates toileting tasks with minimum assistance with physical assistance provided for clothing mgmt to change soiled brief 2/2 urinary incontinence  Eating:           - independent, set-up in hospital setting  Grooming:     - stand-by assistance; in stance at sink with cues  provided for sequencing and use of soap  LB dressing: - set-up, increased time; pt dons socks seated at EOB    Mobility:  -  Patient completed supine to sit with modified independent  -  Patient performed sit to supine with minimum assistance, assist provided for LE mgmt  -  Patient completed sit to stand from edge of bed and commode with standby assistance  -  Patient completed private room navigation with contact guard assistance for am care set-up and toileting.   Patient required cueing for walker management and continued use of walker with pt attempting to leave behind several occasions. Assist to keep brief in place during mobility.  -  Patient instructed in proper technique for sit <--> stand from commode.  Then completed sit <--> stand from commode with standby assistance    -  Patient requires set - up of assistive device and cues to continue usage throughout all tasks.    Activity Tolerance: fair tolerance to activity during session, pt with increased fatigue this date 2/2 medication    Vitals:   Vitals stable throughout session    Final Location: Positioned in bed, all needs within reach, agrees to call for assistance, (+) bed/chair alarm, care partner present, all bed rails in place.Rennis Petty, COTA/L  March 01, 2021

## 2021-03-02 LAB — POCT GLUCOSE
POC Glucose: 92 mg/dL (ref 65–105)
POC Glucose: 100 mg/dL (ref 65–105)
POC Glucose: 93 mg/dL (ref 65–105)
POC Glucose: 91 mg/dL (ref 65–105)

## 2021-03-02 LAB — GLUCOSE, POC
Glucose (POC): 92 mg/dL (ref 65–105)
Glucose (POC): 100 mg/dL (ref 65–105)
Glucose (POC): 93 mg/dL (ref 65–105)
Glucose (POC): 91 mg/dL (ref 65–105)

## 2021-03-02 MED FILL — LEVETIRACETAM 500 MG TAB: 500 mg | ORAL | Qty: 1

## 2021-03-02 MED FILL — METOPROLOL TARTRATE 100 MG TAB: 100 mg | ORAL | Qty: 1

## 2021-03-02 MED FILL — HYDRALAZINE 50 MG TAB: 50 mg | ORAL | Qty: 1

## 2021-03-02 MED FILL — VITAMIN B-1 100 MG TABLET: 100 mg | ORAL | Qty: 1

## 2021-03-02 MED FILL — VITAMIN B-12  2,500 MCG SUBLINGUAL TABLET: 2500 mcg | SUBLINGUAL | Qty: 1

## 2021-03-02 MED FILL — AMLODIPINE 5 MG TAB: 5 mg | ORAL | Qty: 2

## 2021-03-02 MED FILL — ROSUVASTATIN 10 MG TAB: 10 mg | ORAL | Qty: 1

## 2021-03-02 MED FILL — PANTOPRAZOLE 40 MG TAB, DELAYED RELEASE: 40 mg | ORAL | Qty: 1

## 2021-03-02 MED FILL — ADMELOG U-100 INSULIN LISPRO 100 UNIT/ML SUBCUTANEOUS SOLUTION: 100 unit/mL | SUBCUTANEOUS | Qty: 1

## 2021-03-02 NOTE — Progress Notes (Signed)
Progress Note    Patient: Thomas Jackson   Age:  50 y.o.  DOA: 02/24/2021   Admit Dx / CC: LOS:  LOS: 6 days     Active Problems   1.  Acute metabolic encephalopathy multifactorial  2.  Acute right thalamic hemorrhage  3.  Hypertension  4.  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  CKD stage IV  6.  Vitamin D deficiency  7.  Seizure disorder  8.  Hyperlipidemia  9.  History of previous stroke  Plan:   1.  Patient seen and examined.  Patient is hemodynamically stable  There has been no significant change in patient's mental status  Patient's condition management plan and discharge planning was discussed with patient's POA.  She requested for skilled nursing facility.  She also requested if patient can be seen by a nephrologist  Will consult nephrology in a.m.  2.  PT/OT and speech therapy consults were done  3.  Patient will be continued on thiamine folic acid and B12 supplement  4.  For seizure disorder patient be continued on Keppra 500 mg p.o. twice daily  5.  Case manager consult done to assist with discharge planning.  Patient is medically stable to be discharged to skilled nursing facility  Total time spent managing this patient was over 35 minutes, more than half the time was spent in coordination of care  Subjective:   Unable to obtain     Objective:     Visit Vitals  BP (!) 150/73 (BP 1 Location: Right arm, BP Patient Position: Supine)   Pulse 65   Temp 98.6 ??F (37 ??C)   Resp 16   Ht 5\' 4"  (1.626 m)   Wt 111.8 kg (246 lb 7.6 oz)   SpO2 97%   BMI 42.31 kg/m??       Physical Exam:  General appearance: Middle-age male well-built sitting comfortably no acute distress e  Neurologic:Awake and alert oriented to person only  Following simple commands, able to move all 4 extremities, gait not checked    Intake and Output:  Current Shift:  No intake/output data recorded.  Last three shifts:  07/22 1901 - 07/24 0700  In: 240 [P.O.:240]  Out: 351 [Urine:350]    Lab/Data Reviewed:  Recent  Results (from the past 48 hour(s))   GLUCOSE, POC    Collection Time: 02/28/21 11:55 AM   Result Value Ref Range    Glucose (POC) 113 (H) 65 - 105 mg/dL   GLUCOSE, POC    Collection Time: 02/28/21  4:46 PM   Result Value Ref Range    Glucose (POC) 106 (H) 65 - 105 mg/dL   GLUCOSE, POC    Collection Time: 02/28/21  9:09 PM   Result Value Ref Range    Glucose (POC) 109 (H) 65 - 105 mg/dL   GLUCOSE, POC    Collection Time: 03/01/21  8:12 AM   Result Value Ref Range    Glucose (POC) 90 65 - 105 mg/dL   GLUCOSE, POC    Collection Time: 03/01/21 12:01 PM   Result Value Ref Range    Glucose (POC) 103 65 - 105 mg/dL   GLUCOSE, POC    Collection Time: 03/01/21  4:53 PM   Result Value Ref Range    Glucose (POC) 176 (H) 65 - 105 mg/dL   GLUCOSE, POC    Collection Time: 03/01/21  9:01 PM   Result Value Ref Range    Glucose (  POC) 92 65 - 105 mg/dL   GLUCOSE, POC    Collection Time: 03/02/21  8:36 AM   Result Value Ref Range    Glucose (POC) 100 65 - 105 mg/dL       Imaging:  No results found.      Medications Reviewed:  Current Facility-Administered Medications   Medication Dose Route Frequency    hydrALAZINE (APRESOLINE) tablet 50 mg  50 mg Oral TID    metoprolol tartrate (LOPRESSOR) tablet 100 mg  100 mg Oral BID    thiamine HCL (B-1) tablet 100 mg  100 mg Oral DAILY    cyanocobalamin (VITAMIN B12) sublingual tablet 2,500 mcg  2,500 mcg SubLINGual DAILY    ergocalciferol capsule 50,000 Units  50,000 Units Oral Q7D    dextrose (D50W) injection syrg 5-25 g  10-50 mL IntraVENous PRN    insulin glargine (LANTUS) injection 1-100 Units  1-100 Units SubCUTAneous QHS    insulin lispro (HUMALOG) injection 1-100 Units  1-100 Units SubCUTAneous AC&HS    insulin lispro (HUMALOG) injection 1-100 Units  1-100 Units SubCUTAneous PRN    hydrALAZINE (APRESOLINE) 20 mg/mL injection 20 mg  20 mg IntraVENous Q6H PRN    amLODIPine (NORVASC) tablet 10 mg  10 mg Oral DAILY    glucagon (GLUCAGEN) injection 1 mg  1 mg IntraMUSCular PRN     levETIRAcetam (KEPPRA) tablet 500 mg  500 mg Oral BID    pantoprazole (PROTONIX) tablet 40 mg  40 mg Oral ACB    naloxone (NARCAN) injection 0.1 mg  0.1 mg IntraVENous PRN    ondansetron (ZOFRAN) injection 4 mg  4 mg IntraVENous Q4H PRN    rosuvastatin (CRESTOR) tablet 10 mg  10 mg Oral QHS         Dragon medical dictation software was used for portions of this report. Unintended errors may occur.   Hazel Sams, MD  P# [phone]  March 02, 2021

## 2021-03-03 LAB — POCT GLUCOSE
POC Glucose: 102 mg/dL (ref 65–105)
POC Glucose: 120 mg/dL — ABNORMAL HIGH (ref 65–105)
POC Glucose: 149 mg/dL — ABNORMAL HIGH (ref 65–105)

## 2021-03-03 LAB — GLUCOSE, POC
Glucose (POC): 102 mg/dL (ref 65–105)
Glucose (POC): 120 mg/dL — ABNORMAL HIGH (ref 65–105)
Glucose (POC): 149 mg/dL — ABNORMAL HIGH (ref 65–105)

## 2021-03-03 MED ORDER — CLONAZEPAM 0.5 MG TAB
0.5 mg | Freq: Two times a day (BID) | ORAL | Status: DC | PRN
Start: 2021-03-03 — End: 2021-03-05

## 2021-03-03 MED ORDER — HYDRALAZINE 25 MG TAB
25 mg | Freq: Three times a day (TID) | ORAL | Status: DC
Start: 2021-03-03 — End: 2021-03-05
  Administered 2021-03-03 – 2021-03-05 (×6): via ORAL

## 2021-03-03 MED ORDER — METOPROLOL TARTRATE 50 MG TAB
50 mg | Freq: Two times a day (BID) | ORAL | Status: DC
Start: 2021-03-03 — End: 2021-03-05
  Administered 2021-03-04 – 2021-03-05 (×4): via ORAL

## 2021-03-03 MED ORDER — ESCITALOPRAM 10 MG TAB
10 mg | Freq: Every evening | ORAL | Status: DC
Start: 2021-03-03 — End: 2021-03-05
  Administered 2021-03-03 – 2021-03-04 (×2): via ORAL

## 2021-03-03 MED FILL — HYDRALAZINE 25 MG TAB: 25 mg | ORAL | Qty: 1

## 2021-03-03 MED FILL — VITAMIN B-12  2,500 MCG SUBLINGUAL TABLET: 2500 mcg | SUBLINGUAL | Qty: 1

## 2021-03-03 MED FILL — HYDRALAZINE 50 MG TAB: 50 mg | ORAL | Qty: 1

## 2021-03-03 MED FILL — METOPROLOL TARTRATE 100 MG TAB: 100 mg | ORAL | Qty: 1

## 2021-03-03 MED FILL — THIAMINE HCL 100 MG TAB: 100 mg | ORAL | Qty: 1

## 2021-03-03 MED FILL — ADMELOG U-100 INSULIN LISPRO 100 UNIT/ML SUBCUTANEOUS SOLUTION: 100 unit/mL | SUBCUTANEOUS | Qty: 1

## 2021-03-03 MED FILL — ROSUVASTATIN 10 MG TAB: 10 mg | ORAL | Qty: 1

## 2021-03-03 MED FILL — LEVETIRACETAM 500 MG TAB: 500 mg | ORAL | Qty: 1

## 2021-03-03 MED FILL — PANTOPRAZOLE 40 MG TAB, DELAYED RELEASE: 40 mg | ORAL | Qty: 1

## 2021-03-03 MED FILL — ESCITALOPRAM 10 MG TAB: 10 mg | ORAL | Qty: 1

## 2021-03-03 MED FILL — AMLODIPINE 5 MG TAB: 5 mg | ORAL | Qty: 2

## 2021-03-03 NOTE — Progress Notes (Signed)
SPEECH LANGUAGE PATHOLOGY   SPEECH- LANGUAGE TREATMENT     Patient: Thomas Jackson (50 y.o. male) 1971-01-16  Room: 6614/6614  Primary Diagnosis: Thalamic hemorrhage (HCC) [I61.0]          Isolation:  There are currently no Active Isolations       MDRO: No current active infections  PPE: surgical mask, gloves  Precautions:   aspiration and fall    Date: 03/03/2021   Start Time:  3:00 End Time:  3:25 pm          TREATMENT:   Treatment:  Speech- language tx completed b/s with tasks listed below.  Patient completed confrontational naming tasks with ADL objects and only able to name 1 object out of 6 with a model. He was able to produce 2 numbers when counting from 1-10 with model and written word. Unable to model or produce days of the week. He completed phrases with opposites with 1 out of 7 correctly. He followed basic directions with 50% acc independently and 80% w/ visual prompt. Simple y/n questions were answered with 50% acc. Conversational speech was full of paraphasias and patient appeared to be unaware of verbal errors.    Aphasia Severity Rating: ASR 1- The individual may occasionally produce words or phrases that are meaningful in context, but communication is fragmentary and not possible without significant help from the listener, e.g. guessing, questioning.  Orlene Erm is very 'one sided' with the listener bearing almost all of the burden.  An extremely limited amount or range of information is exchanged.  Misunderstandings or failed communications are frequent    PLAN/ RECOMMENDATIONS:     Therapy Recommendations: pt would benefit from continuation of SLP as per POC    Therapy Prognosis: good    Discharge Recommendations: SNF  Other Recommended Services: OT, PT     SUBJECTIVE:    Patient Stated: "Gotcha."   Pain Prior to Treatment: no pain reported   Pain Following Treatment: no pain reported     OBJECTIVE:    Receptive/ Expressive Language Skills:    See above treatment note for details    Safety:  Following session, pt tray table, phone, and call bell within reach   Modified Rankin Score (MRS): Defer to OT/ PT    EDUCATION:   Education Provided: POC and compensatory strategies for speech  Individual Educated: patient and nurse  Comprehension: pt unable to communicate comprehension, no evidence of learning, and no family present   Staff Education: Educated Nurse to POC and compensatory strategies for speech.     Thank you,    Ileene Musa MS, CCC-SLP CBIS  Speech Language Pathologist  Clinical Brain Injury Specialist  [phone]

## 2021-03-03 NOTE — Progress Notes (Signed)
Progress Notes by Dorise Hiss., PT at 03/03/21 1038                Author: Dorise Hiss., PT  Service: Physical Therapy  Author Type: Physical Therapist       Filed: 03/03/21 1404  Date of Service: 03/03/21 1038  Status: Addendum          Editor: Dorise Hiss., PT (Physical Therapist)          Related Notes: Original Note by Dorise Hiss., PT (Physical Therapist) filed at 03/03/21 1337                 PHYSICAL THERAPY RE-EVALUATION/TREATMENT           Patient: Thomas Jackson (50 y.o. male)   Room: 6614/6614      Date: 03/03/2021   Start Time:  1010   End Time:  1033      Primary Diagnosis: Thalamic hemorrhage (HCC) [I61.0]            Precautions: Falls..   Weight bearing precautions: None            Orders reviewed, chart reviewed, completed on Quentin Mulling.   Discussed with nursing.        ASSESSMENT :   Based on the objective data described below, the patient presents with    Decreased Strength   Decreased Transfer Abilities   Decreased Ambulation Ability/Technique   Decreased Balance   Decreased Independence with Home Exercise Program.   Pt require min assist in bed mobility and transfer.   Pt need min assist in walking using a RW.   Demo fair walking balance   Demo mm weakness on R LE    Participated well with PT   limited mobility due to generalized weakness.   Recommend SNF upon dc for gait transfer, balance and strengthening exs.       Modified Rankin Score (mRS): 3 - Moderate disability; requires some help but able to walk without (human) assistance - cane, walker etc.         Functional Status Score for the Intensive Care Unit (FSS-ICU)   Task   Score   1.     Rolling  6 - Use of bed rail or object to pull on      2. Supine to Sit Transfer  4 - Min assist (patient performs 75% or more of the work)      3. Sit to Stand Transfer  5 - Requires cues or coaxing, but can physically perform without assistance (may push up from surface)      4. Sitting Edge of Bed  7 - Independent, maintains  hands free      5. Walking  1 - Walks <50 feet with the assistance of 1 person OR requires the assistance of 2 people to assist with ambulation of any distance      TOTAL SCORE:  23/35      INITIAL TOTAL SCORE:  22                    Patient will benefit from skilled  Physical Therapy intervention to address the above impairments.      Patients rehabilitation potential is considered to be Good    GOALS:   - Patient will be modified independent with bed mobility and will be modified independent with transfers in preparation for OOB activities and ambulation.   - Patient will tolerate sitting up in chair for  meals.   - Patient will ambulate with supervision/set-up for 50 feet with the least restrictive device to promote functional independence at home.   - Patient will demonstrate good balance to safely enable upright activities and reduce risk for falls.   - Patient will demonstrate good activity tolerance during functional activities.   - Patient will demonstrate good safety awareness during functional activities.   - Patient will improve FSS-ICU score to at least 26/35 in order to indicate improved functional independence.   - Patient will be standby assist with lower extremity home exercise program to increase strength and endurance.   - Patient will utilize energy conservation techniques during functional activities.             PLAN :   Planned Interventions:   Functional mobility training Gait Training Balance Training Therapeutic exercises Therapeutic activities      PT to see patient 3-5 times/week x 1 wk.      Recommendations:   Physical Therapy   Discharge Recommendations: SNF   Further Equipment Recommendations for Discharge: tbd               Please refer to Patient Education and Care Plan sections of chart for additional details        SUBJECTIVE:     Patient agreed to PT reported that he is a little tired after walking.        OBJECTIVE DATA SUMMARY:     Present illness history:       Problem List    Never Reviewed                           Codes  Class  Noted             Thalamic hemorrhage (HCC)  ICD-10-CM: I61.0   ICD-9-CM: 431    02/24/2021                         Patient found: Bed.      Pain Assessment before PT session: None observed and None reported   Pain Location:     Pain Assessment after PT session: None observed and None reported   Pain Location:     []            Yes, patient had pain medications   []           No, Patient has not had pain medications   []           Nurse notified        COGNITIVE STATUS:        Communication:   aphasia and expressive        EXTREMITIES ASSESSMENT:            Strength:     R LE 2+ to 3/5, L LE 3+ to 4/5      Range Of Motion:   wfl        Functional mobility and balance status:        Mobility:   Supine to sit -  contact guard and min. assist   Sit to Supine -  contact guard   Sit to Stand -  contact guard and min. assist   Stand to Sit -  contact guard      Transfers:   Bed to bedside chair -  contact guard and min. assist   Bedside chair to bed -  contact guard and  min. assist      Balance:   Dynamic Sitting Balance -  good   Static Standing Balance -  fair+   Dynamic Standing Balance -  fair      Static/dynamic sitting/standing balance scores:          Good   []    accepts significant challenge without UE support or assistive device           Good -   []    accepts moderate challenge without UE support or assistive device           Fair +   []    accepts minimal challenge without UE support or assistive device       Fair   []    maintains static balance without UE support or assistive device, accepts no challenge       Fair -   []    requires contact guard assist/cues to maintain position or requires UE support       Poor +   []    requires minimal assist to maintain position       Poor   []    requires moderate assist to maintain position           Poor -   []    requires maximal assist to maintain position             Activity Tolerance: fair.      Ambulation/Gait  Training:   trendelenburg Rolling walker min assist   20 feet, + 20 ft              TREATMENT:     Patient received/participated in 23 minutes of treatment (therapeutic exercises/activities)    Lower Extremities therapeutic exercises:   Supine, Glut Set, Quad Set, Heel Slide, Straight leg raise, Hip ab/duction, Ankle pumps, BLE, 10 reps, Rest breaks, and Cueing      Balance Activities: Standing      Activity Tolerance:    fair      Final Location:    bed, all needs close, and agrees to call for assistance        COMMUNICATION/EDUCATION:        Barriers to Learning/Limitations:  yes;  sensory deficits-vision/hearing/speech   Education provided patient on  Benefit of activity while hospitalized, Call for assistance, Out of bed 2-3 times/day, Staff assistance with mobility, Changes positions frequently, Sit out of bed for 45-60 minutes or as tolerated, HEP, Reinforce needed,  and Role of PT      Thank you for this referral.   , PT   Pager # : [phone]

## 2021-03-03 NOTE — Progress Notes (Signed)
Notified that this patient with CKD4 was admitted several days back-> patient apparently has followed with Dr Alphonzo Lemmings in the past for the CKD and family requesting renal consult.     Cr looks stable in the 3's and this appears to be his baseline.    I will check Dr. Iline Oven notes, and we will follow him along with you while he remains in the hospital.    Thanks.  Discussed with Dr. Tasia Catchings.

## 2021-03-03 NOTE — Progress Notes (Signed)
Progress Note    Patient: Thomas Jackson   Age:  50 y.o.  DOA: 02/24/2021   Admit Dx / CC: LOS:  LOS: 7 days     Active Problems   1.  Acute metabolic encephalopathy multifactorial  2.  Acute right thalamic hemorrhage  3.  Hypertension  4.  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  CKD stage IV  6.  Vitamin D deficiency  7.  Seizure disorder  8.  Hyperlipidemia  9.  History of previous stroke  Plan:   1.  Patient seen and examined.  On family's request nephrology consult was done.  Patient was discussed with nephrologist.  Continue current therapy and outpatient follow-up  2.  PT/OT and speech therapy consults were done  3.  Patient will be continued on thiamine folic acid and B12 supplement  4.  For seizure disorder patient be continued on Keppra 500 mg p.o. twice daily  5.  Case manager consult done to assist with discharge planning.  Patient is medically stable to be discharged to skilled nursing facility  Patient is waiting for skilled nursing facility  Subjective:   Unable to obtain     Objective:     Visit Vitals  BP 138/60 (BP 1 Location: Left arm, BP Patient Position: Supine)   Pulse 63   Temp 98.2 ??F (36.8 ??C)   Resp 20   Ht 5\' 4"  (1.626 m)   Wt 109.1 kg (240 lb 8.4 oz)   SpO2 98%   BMI 41.29 kg/m??       Physical Exam:  General appearance: Middle-age male well-built sitting comfortably and no acute distress eating lunch  Neurologic:Awake and alert oriented to person only  Following simple commands, able to move all 4 extremities, gait not checked    Intake and Output:  Current Shift:  No intake/output data recorded.  Last three shifts:  07/24 0701 - 07/25 1900  In: 320 [P.O.:320]  Out: 0     Lab/Data Reviewed:  Recent Results (from the past 48 hour(s))   GLUCOSE, POC    Collection Time: 03/01/21  9:01 PM   Result Value Ref Range    Glucose (POC) 92 65 - 105 mg/dL   GLUCOSE, POC    Collection Time: 03/02/21  8:36 AM   Result Value Ref Range    Glucose (POC) 100 65 - 105 mg/dL    GLUCOSE, POC    Collection Time: 03/02/21 12:07 PM   Result Value Ref Range    Glucose (POC) 93 65 - 105 mg/dL   GLUCOSE, POC    Collection Time: 03/02/21  4:38 PM   Result Value Ref Range    Glucose (POC) 91 65 - 105 mg/dL   GLUCOSE, POC    Collection Time: 03/02/21  9:09 PM   Result Value Ref Range    Glucose (POC) 102 65 - 105 mg/dL   GLUCOSE, POC    Collection Time: 03/03/21 11:29 AM   Result Value Ref Range    Glucose (POC) 120 (H) 65 - 105 mg/dL   GLUCOSE, POC    Collection Time: 03/03/21  4:29 PM   Result Value Ref Range    Glucose (POC) 149 (H) 65 - 105 mg/dL       Imaging:  No results found.      Medications Reviewed:  Current Facility-Administered Medications   Medication Dose Route Frequency    hydrALAZINE (APRESOLINE) tablet 25 mg  25 mg Oral TID  metoprolol tartrate (LOPRESSOR) tablet 50 mg  50 mg Oral BID    escitalopram oxalate (LEXAPRO) tablet 5 mg  5 mg Oral QPM    clonazePAM (KlonoPIN) tablet 0.25 mg  0.25 mg Oral BID PRN    thiamine HCL (B-1) tablet 100 mg  100 mg Oral DAILY    cyanocobalamin (VITAMIN B12) sublingual tablet 2,500 mcg  2,500 mcg SubLINGual DAILY    ergocalciferol capsule 50,000 Units  50,000 Units Oral Q7D    dextrose (D50W) injection syrg 5-25 g  10-50 mL IntraVENous PRN    insulin lispro (HUMALOG) injection 1-100 Units  1-100 Units SubCUTAneous PRN    hydrALAZINE (APRESOLINE) 20 mg/mL injection 20 mg  20 mg IntraVENous Q6H PRN    amLODIPine (NORVASC) tablet 10 mg  10 mg Oral DAILY    glucagon (GLUCAGEN) injection 1 mg  1 mg IntraMUSCular PRN    levETIRAcetam (KEPPRA) tablet 500 mg  500 mg Oral BID    pantoprazole (PROTONIX) tablet 40 mg  40 mg Oral ACB    naloxone (NARCAN) injection 0.1 mg  0.1 mg IntraVENous PRN    ondansetron (ZOFRAN) injection 4 mg  4 mg IntraVENous Q4H PRN    rosuvastatin (CRESTOR) tablet 10 mg  10 mg Oral QHS         Dragon medical dictation software was used for portions of this report. Unintended errors may occur.   Hazel Sams, MD  P# [phone]  March 03, 2021

## 2021-03-03 NOTE — Progress Notes (Signed)
Chaplain Initial Consultation    Start Visit: 1105  End Visit: 1110    Chaplain conducted an initial consultation and Spiritual Assessment for Thomas Jackson, who is a 50 y.o.,male. Patient's Primary Language is: [place].   According to the patient's EMR Religious Affiliation is: No preference.     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   Chart reviewed.    The following outcomes where achieved:  Patient expressed gratitude for volunteer chaplain's visit.    Assessment:  Patient does not have any religious/cultural needs that will affect patient's preferences in health care.  There are no spiritual or religious issues which require intervention at this time.     Plan:  Chaplains will continue to follow and will provide pastoral care on an as needed/requested basis.  Chaplain charted for Barrister's clerk.    Nona Dell.  Scientist, water quality Care  [phone]

## 2021-03-03 NOTE — Progress Notes (Signed)
Pt resting in bed after PT was in this AM.

## 2021-03-03 NOTE — Progress Notes (Signed)
Received call from patients girlfriend, Thomas Jackson. She was upset that patient was being given forms to sign when he is not cognitively able to sign; states his sister is his POA. Informed her that we do not have any POA paperwork on file; she states she will get a copy. Even though pt has a POA, girlfriend wants to be the one called first when he is moved and with any updates as she is the closest one physically to him and is the one who cares for him. [facility] is the first choice for facilities.

## 2021-03-04 LAB — POCT GLUCOSE
POC Glucose: 114 mg/dL — ABNORMAL HIGH (ref 65–105)
POC Glucose: 96 mg/dL (ref 65–105)
POC Glucose: 121 mg/dL — ABNORMAL HIGH (ref 65–105)

## 2021-03-04 LAB — GLUCOSE, POC
Glucose (POC): 114 mg/dL — ABNORMAL HIGH (ref 65–105)
Glucose (POC): 96 mg/dL (ref 65–105)
Glucose (POC): 121 mg/dL — ABNORMAL HIGH (ref 65–105)

## 2021-03-04 MED FILL — PANTOPRAZOLE 40 MG TAB, DELAYED RELEASE: 40 mg | ORAL | Qty: 1

## 2021-03-04 MED FILL — HYDRALAZINE 25 MG TAB: 25 mg | ORAL | Qty: 1

## 2021-03-04 MED FILL — METOPROLOL TARTRATE 50 MG TAB: 50 mg | ORAL | Qty: 1

## 2021-03-04 MED FILL — LEVETIRACETAM 500 MG TAB: 500 mg | ORAL | Qty: 1

## 2021-03-04 MED FILL — ROSUVASTATIN 10 MG TAB: 10 mg | ORAL | Qty: 1

## 2021-03-04 MED FILL — ESCITALOPRAM 10 MG TAB: 10 mg | ORAL | Qty: 1

## 2021-03-04 MED FILL — VITAMIN B-12  2,500 MCG SUBLINGUAL TABLET: 2500 mcg | SUBLINGUAL | Qty: 1

## 2021-03-04 MED FILL — AMLODIPINE 5 MG TAB: 5 mg | ORAL | Qty: 2

## 2021-03-04 MED FILL — VITAMIN B-1 100 MG TABLET: 100 mg | ORAL | Qty: 1

## 2021-03-04 NOTE — Progress Notes (Signed)
 OCCUPATIONAL THERAPY TREATMENT     Patient: Thomas Jackson (49 y.o. male)  Room: 6614/6614    Primary Diagnosis: Thalamic hemorrhage [facility]) [I61.0]       Date of Admission: 02/24/2021   Length of Stay:  8 day(s)  Insurance: Payor: [facility] / Plan: [facility] MEDICARE A & B / Product Type: Medicare /      Date: 03/04/2021  In time:  10:27        Out time:  11:07    Isolation:  There are currently no Active Isolations       MDRO: No active infections    Precautions: falls   Ordered weight bearing status: NA    ASSESSMENT:    Based on the objective data described below, the patient presents with       -  patient making progress towards established goals.   -  patient motivated to participate in OT to return to prior level of function in ADLs, IADLs and functional mobility.    - Modified Rankin Score (mRS): 3 - Moderate disability; requires some help but able to walk without (human) assistance - cane, walker etc.        PLAN: Continue OT POC    Recommendations:  Recommend continued skilled occupational therapy intervention to address above impairments.  Recommend out of bed activity to counteract ill effects of bedrest, with assistance from staff as needed.    Discharge Recommendations: skilled nursing facility (SNF).  Equipment Recommendations for Discharge: no needs anticipated; has DME available at home     Education/ communication:     Barriers to learning/limitations:  Yes;  cognitive  Education provided to: patient on (+) role of OT, (+) OT plan of care, (+) instructed patient on the importance of activity while hospitalized to prevent a decline in function, (+) encouraged patient to sit up in chair for 45 (+) minutes or as tolerated 2-3 times a day, with staff assistance as needed, (+) staff assistance with mobility, (+) change positions frequently, (+) functional mobility, (+) ADL training, (+) safety  Educational handouts issued: none this session  Patient / family response to education: showing interest, trying to  perform skills    SUBJECTIVE:     Patient agreed to OT session.    OBJECTIVE DATA SUMMARY:     Orders, labs, occupational therapy and chart reviewed on Thomas Jackson. Communicated with nursing staff. Patient cleared to participate in Occupational Therapy treatment.    Patient found: Bed, (+) bed/chair exit alarm    Most recent value for oxygen in flow sheets:  O2 Device: None (Room air) (03/04/21 0400)        Pain assessment: none reported, none observed    Cognitive:   Mental status:     Neuro state: awake and alert  Communication: limited verbalization, demoing some word salad at times; expressive aphasia  Attention Span:  fair (15-9min)  Follows commands: delayed response, requires cues to stay on task  Safety/Judgement: needs cueing for safety and precautions    Activities of Daily Living:  - Patient found supine in bed, appeared to have been sleeping but awoken when OTA entered the room.   - Patient found to have had urine incontinence when in bed, requiring assistance with cleaning up.   -  Patient completed washing face/hand with stand-by assistance, verbal cues, visual cues, set-up.  -  Patient performed don/doff hospital gown with stand-by assistance x 2 times due to misunderstanding OTA and laying back down in urine soaked bed  times, needing verbal cues, visual cues, set-up.   -  Patient completed doffing soiled brief  with contact guard assistance, gait belt donned, rolling walker, increased time.  - Patient completed toilet hygiene while standing at RW with MIN A for balance.     Mobility:  -  Patient completed supine to sit with standby assistance  -  Patient performed sit to supine with standby assistance  -  Patient completed sit to stand from edge of bed with contact guard assistance  -  Patient completed stand to sit with standby assistance   -  Patient completed private room navigation of 14 feet total from bed to other side of room into bedside chair with contact guard assistance for am care  set-up and toileting.  Patient required cueing for safety    Activity Tolerance:   - patient is motivated to increase activity  - HR 67 O2 at 97% in chair    Balance:  - needs AD    Therapeutic Exercises:   none this session    Final Location: Patient seated in bedside chair, all needs within reach, agrees to call for assistance, (+) bed/chair exit alarm, (+) bilateral LEs elevated, nursing staff notified    Thomas Jackson, OTA  March 04, 2021

## 2021-03-04 NOTE — Progress Notes (Signed)
Pt discovered by nurse passing room sitting in the floor beside the bed. Staff assistance request alarmed. Pt was found as described. No visible wounds. Gait belt applied and patient was assisted to standing position then sitting position, x 3 nurses using proper body mechanics, and without issue. Patient is alert but unable to assess orientation due to stroke deficits, however, mental status unchanged from initial assessment this a.m. MD notified.     Complete bath given to patient without complaints of pain or discomfort. Bed rails up x 3, bed alarm on and working, call bell within reach, rails low and locked, and water in reach on bedside table. No acute distress noted.

## 2021-03-04 NOTE — Progress Notes (Signed)
Received call from Emmi at [facility]. They have a bed and are willing to accept pt; would just like to have updated clinicals. Emailed them to her at [email or web address] as their fax doesn't seem to be working. Anticipate discharge tomorrow.

## 2021-03-04 NOTE — Progress Notes (Signed)
Everything looking stable from our end.    Meds reviewed and look okay -> not receiving anything potentially nephrotoxic.    It's been several days since he's last had labs.  I will order a renal panel for the am in the event he's still here.

## 2021-03-04 NOTE — Progress Notes (Signed)
Left message for Emmi at [facility], Magda Paganini at [facility], and with reception at [facility] in [place], asking for a call back about possible admission today.    Received call from [place] at [facility] [place]. They are able to accept pt; she will call back after getting to facility to verify they have a bed today.  If not, will have a bed tomorrow.

## 2021-03-04 NOTE — Progress Notes (Signed)
Progress Note    Patient: Thomas Jackson   Age:  50 y.o.  DOA: 02/24/2021   Admit Dx / CC: LOS:  LOS: 8 days     Active Problems   1.  Acute metabolic encephalopathy multifactorial  2.  Acute right thalamic hemorrhage  3.  Hypertension  4.  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  CKD stage IV  6.  Vitamin D deficiency  7.  Seizure disorder  8.  Hyperlipidemia  9.  History of previous stroke  Plan:   1.  Patient seen and examined..  Patient's condition is unchanged  Patient is waiting for acceptance at the skilled nursing facility.  Patient is medically stable to be discharged to skilled nursing facility  Patient was discussed with RN and discharge planner  Subjective:   Denies any complain    Objective:     Visit Vitals  BP (!) 158/72   Pulse 67   Temp 98.4 ??F (36.9 ??C)   Resp 18   Ht 5\' 4"  (1.626 m)   Wt 109.1 kg (240 lb 8.4 oz)   SpO2 96%   BMI 41.29 kg/m??       Physical Exam:  General appearance: Middle-age male well-built sitting comfortably and no acute distress   Neurologic:Awake and alert oriented to person only  Following simple commands, able to move all 4 extremities,   Intake and Output:  Current Shift:  No intake/output data recorded.  Last three shifts:  07/24 1901 - 07/26 0700  In: 320 [P.O.:320]  Out: 0     Lab/Data Reviewed:  Recent Results (from the past 48 hour(s))   GLUCOSE, POC    Collection Time: 03/02/21  9:09 PM   Result Value Ref Range    Glucose (POC) 102 65 - 105 mg/dL   GLUCOSE, POC    Collection Time: 03/03/21 11:29 AM   Result Value Ref Range    Glucose (POC) 120 (H) 65 - 105 mg/dL   GLUCOSE, POC    Collection Time: 03/03/21  4:29 PM   Result Value Ref Range    Glucose (POC) 149 (H) 65 - 105 mg/dL   GLUCOSE, POC    Collection Time: 03/03/21  9:38 PM   Result Value Ref Range    Glucose (POC) 114 (H) 65 - 105 mg/dL   GLUCOSE, POC    Collection Time: 03/04/21  8:36 AM   Result Value Ref Range    Glucose (POC) 96 65 - 105 mg/dL   GLUCOSE, POC    Collection  Time: 03/04/21 12:28 PM   Result Value Ref Range    Glucose (POC) 121 (H) 65 - 105 mg/dL       Imaging:  No results found.      Medications Reviewed:  Current Facility-Administered Medications   Medication Dose Route Frequency    hydrALAZINE (APRESOLINE) tablet 25 mg  25 mg Oral TID    metoprolol tartrate (LOPRESSOR) tablet 50 mg  50 mg Oral BID    escitalopram oxalate (LEXAPRO) tablet 5 mg  5 mg Oral QPM    clonazePAM (KlonoPIN) tablet 0.25 mg  0.25 mg Oral BID PRN    thiamine HCL (B-1) tablet 100 mg  100 mg Oral DAILY    cyanocobalamin (VITAMIN B12) sublingual tablet 2,500 mcg  2,500 mcg SubLINGual DAILY    ergocalciferol capsule 50,000 Units  50,000 Units Oral Q7D    dextrose (D50W) injection syrg 5-25 g  10-50 mL IntraVENous PRN  insulin lispro (HUMALOG) injection 1-100 Units  1-100 Units SubCUTAneous PRN    hydrALAZINE (APRESOLINE) 20 mg/mL injection 20 mg  20 mg IntraVENous Q6H PRN    amLODIPine (NORVASC) tablet 10 mg  10 mg Oral DAILY    glucagon (GLUCAGEN) injection 1 mg  1 mg IntraMUSCular PRN    levETIRAcetam (KEPPRA) tablet 500 mg  500 mg Oral BID    pantoprazole (PROTONIX) tablet 40 mg  40 mg Oral ACB    naloxone (NARCAN) injection 0.1 mg  0.1 mg IntraVENous PRN    ondansetron (ZOFRAN) injection 4 mg  4 mg IntraVENous Q4H PRN    rosuvastatin (CRESTOR) tablet 10 mg  10 mg Oral QHS         Dragon medical dictation software was used for portions of this report. Unintended errors may occur.   Hazel Sams, MD  P# [phone]  March 04, 2021

## 2021-03-04 NOTE — Progress Notes (Signed)
 SPEECH LANGUAGE PATHOLOGY   SPEECH- LANGUAGE TREATMENT     Patient: Thomas Jackson (49 y.o. male) [birth year]  Room: 6614/6614  Primary Diagnosis: Thalamic hemorrhage (HCC) [I61.0]          Isolation:  There are currently no Active Isolations       MDRO: No current active infections  PPE: surgical mask, gloves  Precautions:   fall    Date: 03/04/2021   Start Time: 3:35 End Time:  4:10 pm          TREATMENT:   Treatment:  Speech- language tx completed b/s with tasks listed below.  Patient named photos of ADL objects 2 out 10 correctly independently (hammer and ball). Verbal, written and model cues were unsuccessful with this task. He was able to count from 1-5 with written and verbal cues. Patient wrote his last name when asked by SLP and was able to copy his first name. He was unable to wrote numbers unless he was able to copy them. Patient attempted to complete verbal phrases with visual cues with 1 out 7 accurate. Conversational speech consisted of numerous paraphasia with occasional intelligible utterance such as I know,What's that? Is that the one? Gotcha        Aphasia Severity Rating: ASR 1- The individual may occasionally produce words or phrases that are meaningful in context, but communication is fragmentary and not possible without significant help from the listener, e.g. guessing, questioning.  [place] is very 'one sided' with the listener bearing almost all of the burden.  An extremely limited amount or range of information is exchanged.  Misunderstandings or failed communications are frequent    PLAN/ RECOMMENDATIONS:     Therapy Recommendations: pt would benefit from continuation of SLP as per POC    Therapy Prognosis: good and fair    Discharge Recommendations: SNF  Other Recommended Services: OT, PT     SUBJECTIVE:    Patient Stated: Hi.   Pain Prior to Treatment: no pain reported, no pain s/s observed   Pain Following Treatment: no pain reported, no pain s/s observed     OBJECTIVE:     Receptive/ Expressive Language Skills:    See above treatment section for details.    Safety: Following session, pt tray table, phone, and call bell within reach   Modified Rankin Score (MRS): Defer to OT/ PT    EDUCATION:   Education Provided: POC and compensatory strategies for speech  Individual Educated: nurse  Comprehension: pt unable to communicate comprehension   Staff Education: Educated Nurse to POC and compensatory strategies for speech.     Thank you,    Sherran Boys MS, CCC-SLP CBIS  Speech Language Pathologist  Clinical Brain Injury Specialist  [phone]

## 2021-03-05 LAB — RENAL FUNCTION PANEL
Albumin: 3 gm/dl — ABNORMAL LOW (ref 3.4–5.0)
Albumin: 3 gm/dl — ABNORMAL LOW (ref 3.4–5.0)
Anion Gap: 9 mmol/L (ref 5–15)
Anion gap: 9 mmol/L (ref 5–15)
BUN: 55 mg/dl — ABNORMAL HIGH (ref 9–23)
BUN: 55 mg/dl — ABNORMAL HIGH (ref 9–23)
CO2: 21 mEq/L (ref 20–31)
CO2: 21 mEq/L (ref 20–31)
Calcium: 8.7 mg/dl (ref 8.7–10.4)
Calcium: 8.7 mg/dl (ref 8.7–10.4)
Chloride: 106 mEq/L (ref 98–107)
Chloride: 106 mEq/L (ref 98–107)
Creatinine: 4.27 mg/dl — ABNORMAL HIGH (ref 0.70–1.30)
Creatinine: 4.27 mg/dl — ABNORMAL HIGH (ref 0.70–1.30)
EGFR IF NonAfrican American: 16
GFR African American: 19
GFR est AA: 19
GFR est non-AA: 16
Glucose: 95 mg/dl (ref 74–106)
Glucose: 95 mg/dl (ref 74–106)
Phosphorus: 6 mg/dL — ABNORMAL HIGH (ref 2.4–5.1)
Phosphorus: 6 mg/dL — ABNORMAL HIGH (ref 2.4–5.1)
Potassium: 4.8 mEq/L — ABNORMAL HIGH (ref 3.4–4.5)
Potassium: 4.8 mEq/L — ABNORMAL HIGH (ref 3.4–4.5)
Sodium: 136 mEq/L (ref 136–145)
Sodium: 136 mEq/L (ref 136–145)

## 2021-03-05 LAB — POCT GLUCOSE
POC Glucose: 171 mg/dL — ABNORMAL HIGH (ref 65–105)
POC Glucose: 142 mg/dL — ABNORMAL HIGH (ref 65–105)
POC Glucose: 102 mg/dL (ref 65–105)
POC Glucose: 156 mg/dL — ABNORMAL HIGH (ref 65–105)

## 2021-03-05 LAB — COVID-19: COVID-19: NEGATIVE

## 2021-03-05 LAB — COVID-19 (INPATIENT TESTING): COVID-19: NEGATIVE

## 2021-03-05 LAB — GLUCOSE, POC
Glucose (POC): 171 mg/dL — ABNORMAL HIGH (ref 65–105)
Glucose (POC): 142 mg/dL — ABNORMAL HIGH (ref 65–105)
Glucose (POC): 102 mg/dL (ref 65–105)
Glucose (POC): 156 mg/dL — ABNORMAL HIGH (ref 65–105)

## 2021-03-05 MED ORDER — CYANOCOBALAMIN (VITAMIN B-12) 2,500 MCG SUBLINGUAL TAB
2500 mcg | ORAL_TABLET | Freq: Every day | SUBLINGUAL | 0 refills | Status: AC
Start: 2021-03-05 — End: ?

## 2021-03-05 MED ORDER — HYDRALAZINE 25 MG TAB
25 mg | ORAL_TABLET | Freq: Three times a day (TID) | ORAL | 0 refills | Status: DC
Start: 2021-03-05 — End: 2021-04-03

## 2021-03-05 MED ORDER — THIAMINE HCL 100 MG TAB
100 mg | ORAL_TABLET | Freq: Every day | ORAL | 0 refills | Status: AC
Start: 2021-03-05 — End: ?

## 2021-03-05 MED ORDER — ROSUVASTATIN 10 MG TAB
10 mg | ORAL_TABLET | Freq: Every evening | ORAL | 0 refills | Status: AC
Start: 2021-03-05 — End: ?

## 2021-03-05 MED ORDER — ESCITALOPRAM 5 MG TAB
5 mg | ORAL_TABLET | Freq: Every evening | ORAL | 0 refills | Status: AC
Start: 2021-03-05 — End: ?

## 2021-03-05 MED ORDER — ERGOCALCIFEROL (VITAMIN D2) 50,000 UNIT CAP
1250 mcg (50,000 unit) | ORAL_CAPSULE | ORAL | 0 refills | Status: AC
Start: 2021-03-05 — End: ?

## 2021-03-05 MED ORDER — AMLODIPINE 10 MG TAB
10 mg | ORAL_TABLET | Freq: Every day | ORAL | 0 refills | Status: AC
Start: 2021-03-05 — End: ?

## 2021-03-05 MED ORDER — METOPROLOL TARTRATE 50 MG TAB
50 mg | ORAL_TABLET | Freq: Two times a day (BID) | ORAL | 0 refills | Status: AC
Start: 2021-03-05 — End: ?

## 2021-03-05 MED ORDER — PANTOPRAZOLE 40 MG TAB, DELAYED RELEASE
40 mg | ORAL_TABLET | Freq: Every day | ORAL | 0 refills | Status: AC
Start: 2021-03-05 — End: ?

## 2021-03-05 MED ORDER — LEVETIRACETAM 500 MG TAB
500 mg | ORAL_TABLET | Freq: Two times a day (BID) | ORAL | 0 refills | Status: AC
Start: 2021-03-05 — End: ?

## 2021-03-05 MED FILL — HYDRALAZINE 25 MG TAB: 25 mg | ORAL | Qty: 1

## 2021-03-05 MED FILL — ERGOCALCIFEROL (VITAMIN D2) 50,000 UNIT CAP: 1250 mcg (50,000 unit) | ORAL | Qty: 1

## 2021-03-05 MED FILL — METOPROLOL TARTRATE 50 MG TAB: 50 mg | ORAL | Qty: 1

## 2021-03-05 MED FILL — PANTOPRAZOLE 40 MG TAB, DELAYED RELEASE: 40 mg | ORAL | Qty: 1

## 2021-03-05 MED FILL — AMLODIPINE 5 MG TAB: 5 mg | ORAL | Qty: 2

## 2021-03-05 MED FILL — VITAMIN B-1 100 MG TABLET: 100 mg | ORAL | Qty: 1

## 2021-03-05 MED FILL — LEVETIRACETAM 500 MG TAB: 500 mg | ORAL | Qty: 1

## 2021-03-05 MED FILL — VITAMIN B-12  2,500 MCG SUBLINGUAL TABLET: 2500 mcg | SUBLINGUAL | Qty: 1

## 2021-03-05 MED FILL — ROSUVASTATIN 10 MG TAB: 10 mg | ORAL | Qty: 1

## 2021-03-05 NOTE — Progress Notes (Signed)
1150- emailed d/c summary and covid results to Emmi at [facility]    Discharge Plan:   SNF    Discharge Date:     03/05/2021     Is patient going to snf? Yes   name [facility] [place]    Does patient meet [employer] qualifications or exemptions PASSAR done    [facility] given:  yes    Transportation: Haematologist    medical- Materials engineer Time:    1300    Family, MD, patient, nurse and facility aware of pickup time    yes    Spoke with SO, Marcelino Duster, to let her know the plan for discharge  MD, RN and SNF all aware of plan

## 2021-03-05 NOTE — Progress Notes (Signed)
The patient was deemed medically stable for discharge and was discharged to [facility] of [place], [place] via EMS at 1300.     All Ivs and telemetry removed.     Room spot checked for belongings, all belongings sent with patient.     Attempted x2 to call report, both times this nurse got busy tone. Discharge summary, medication list, COVID results sent in discharge packet.     Everitt Amber, BSN-RN

## 2021-03-05 NOTE — Discharge Summary (Signed)
Discharge Summary    Patient: Thomas Jackson               Sex: male          DOA: 02/24/2021         Date of Birth:  [birth year]      Age:  50 y.o.        LOS:  LOS: 9 days                Admit Date: 02/24/2021    Discharge Date: 03/05/2021    Admission Diagnoses:     Discharge Diagnoses:   1.  Acute metabolic encephalopathy multifactorial  2.  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  Hypertension  4.  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  CKD stage IV  6.  Vitamin D deficiency  7.  Seizure disorder  8.  Hyperlipidemia  9.  History of previous stroke  10.  Morbid obesity with BMI more than 41  Current Discharge Medication List        START taking these medications    Details   amLODIPine (NORVASC) 10 mg tablet Take 1 Tablet by mouth in the morning.  Qty: 30 Tablet, Refills: 0      cyanocobalamin (VITAMIN B12) 2,500 mcg sublingual tablet Take 1 Tablet by mouth in the morning.  Qty: 30 Tablet, Refills: 0      ergocalciferol (ERGOCALCIFEROL) 1,250 mcg (50,000 unit) capsule Take 1 Capsule by mouth every seven (7) days.  Qty: 4 Capsule, Refills: 0      escitalopram oxalate (LEXAPRO) 5 mg tablet Take 1 Tablet by mouth every evening.  Qty: 30 Tablet, Refills: 0      hydrALAZINE (APRESOLINE) 25 mg tablet Take 1 Tablet by mouth three (3) times daily.  Qty: 90 Tablet, Refills: 0      levETIRAcetam (KEPPRA) 500 mg tablet Take 1 Tablet by mouth two (2) times a day.  Qty: 60 Tablet, Refills: 0      metoprolol tartrate (LOPRESSOR) 50 mg tablet Take 1 Tablet by mouth two (2) times a day.  Qty: 60 Tablet, Refills: 0      pantoprazole (PROTONIX) 40 mg tablet Take 1 Tablet by mouth Daily (before breakfast).  Qty: 30 Tablet, Refills: 0      rosuvastatin (CRESTOR) 10 mg tablet Take 1 Tablet by mouth nightly.  Qty: 30 Tablet, Refills: 0      thiamine HCL (B-1) 100 mg tablet Take 1 Tablet by mouth in the morning.  Qty: 30 Tablet, Refills: 0           Discharge Medications:     Current  Discharge Medication List        START taking these medications    Details   amLODIPine (NORVASC) 10 mg tablet Take 1 Tablet by mouth in the morning.  Qty: 30 Tablet, Refills: 0  Start date: 03/06/2021      cyanocobalamin (VITAMIN B12) 2,500 mcg sublingual tablet Take 1 Tablet by mouth in the morning.  Qty: 30 Tablet, Refills: 0  Start date: 03/06/2021      ergocalciferol (ERGOCALCIFEROL) 1,250 mcg (50,000 unit) capsule Take 1 Capsule by mouth every seven (7) days.  Qty: 4 Capsule, Refills: 0  Start date: 03/12/2021      escitalopram oxalate (LEXAPRO) 5 mg tablet Take 1 Tablet by mouth every evening.  Qty: 30 Tablet, Refills: 0  Start date: 03/05/2021      hydrALAZINE (APRESOLINE)  25 mg tablet Take 1 Tablet by mouth three (3) times daily.  Qty: 90 Tablet, Refills: 0  Start date: 03/05/2021      levETIRAcetam (KEPPRA) 500 mg tablet Take 1 Tablet by mouth two (2) times a day.  Qty: 60 Tablet, Refills: 0  Start date: 03/05/2021      metoprolol tartrate (LOPRESSOR) 50 mg tablet Take 1 Tablet by mouth two (2) times a day.  Qty: 60 Tablet, Refills: 0  Start date: 03/05/2021      pantoprazole (PROTONIX) 40 mg tablet Take 1 Tablet by mouth Daily (before breakfast).  Qty: 30 Tablet, Refills: 0  Start date: 03/06/2021      rosuvastatin (CRESTOR) 10 mg tablet Take 1 Tablet by mouth nightly.  Qty: 30 Tablet, Refills: 0  Start date: 03/05/2021      thiamine HCL (B-1) 100 mg tablet Take 1 Tablet by mouth in the morning.  Qty: 30 Tablet, Refills: 0  Start date: 03/06/2021             Follow-up: PCP 1 week after discharge from skilled nursing facility  Nephrologist 3 to 4 weeks  Neurology 1 month  Discharge Condition: Stable    Activity: As tolerated    Diet: Cardiac and diabetic    Labs:  Labs: Results:   Chemistry Recent Labs     03/05/21  0458   GLU 95   NA 136   K 4.8*   CL 106   CO2 21   BUN 55*   CREA 4.27*   CA 8.7   AGAP 9   ALB 3.0*       CBC w/Diff No results found for this visit on 02/24/21 (from the past 12 hour(s)).   Cardiac  Enzymes No results found for: CPK, RCK1, RCK2, RCK3, RCK4, CKMB, CKNDX, CKND1, TROPT, TROIQ, BNPP, BNP   Coagulation Lab Results   Component Value Date/Time    INR 1.0 02/24/2021 06:18 PM    Prothrombin time 12.1 02/24/2021 06:18 PM      Lipid Panel Lab Results   Component Value Date/Time    Cholesterol, total 134 02/25/2021 03:58 AM    HDL Cholesterol 34 (L) 02/25/2021 03:58 AM    LDL, calculated 64 02/25/2021 03:58 AM    Triglyceride 181 (H) 02/25/2021 03:58 AM    CHOL/HDL Ratio 3.9 02/25/2021 03:58 AM      BNP No results found for: BNP, BNPP, BNPPPOC, XBNPT, BNPNT   Liver Enzymes Recent Labs     03/05/21  0458   ALB 3.0*      Vitamins No results found for: VITD3, XQVID2, XQVID3, XQVID, VD3RIA    Lab Results   Component Value Date/Time    Folate 26.70 (H) 02/25/2021 03:58 AM      Thyroid Studies Lab Results   Component Value Date/Time    TSH 2.782 02/25/2021 03:58 AM          Imaging/Procedures:   No results found.  CT Results (most recent):  Results from Hospital Encounter encounter on 02/24/21    CT HEAD WO CONT    Narrative  INDICATION: icb.  COMPARISON: 02/24/2021.  TECHNIQUE: Axial Head CT without IV contrast. Sagittal and coronal  reconstructions were performed.    All CT exams at this facility use one or more dose reduction techniques  including automatic exposure control, mA/kV adjustment per patient's size, or  iterative reconstruction technique.    FINDINGS:  Again noted is focal hemorrhage centered in the right basal ganglia extending  into the right lateral and third ventricles. The overall size of the hemorrhage  measures 2.2 x 1.5 cm, unchanged.    Ventricles and sulci are normal in size and configuration.  Encephalomalacia/gliosis from remote infarct in the left posterior frontal,  parietal, temporal, and occipital lobes. Encephalomalacia from remote infarct in  the left basal ganglia. No CT evidence of acute large territorial infarction.    Imaged portions of the paranasal sinuses and mastoid air  cells are well aerated.  Calvarium is unremarkable.    Impression  IMPRESSION:  1.  Stable hemorrhage in the right basal ganglia extending to the right lateral  ventricle and third ventricle.  2.  Sequelae of remote left MCA territory infarction.    MRI Results (most recent):  No results found for this or any previous visit.    XR Results (most recent):  No results found for this or any previous visit.    [place] Results (most recent):  No results found for this or any previous visit.    No results found for this or any previous visit.  EKG Results       None                Consults: Neurology, neurosurgery, PT OT and speech therapy  PCCM  Treatment Team: Treatment Team: Attending Provider: Hazel Sams, MD; Consulting Provider: Hazel Sams, MD; Hospitalist: Hazel Sams, MD; Nurse Navigator: Talbert Cage, RN; Care Manager: Jenell Milliner; Consulting Provider: Vernard Gambles, MD; Speech Language Pathologist: Johny Blamer, SLP; Physical Therapist: Case, Delorse Limber, DPT; Primary Nurse: Everitt Amber, RN    Significant Diagnostic Studies:   Hospital Course:   Patient was brought to the emergency room with a chief complaint of not responding well.  I was not able to obtain much history from the patient.  Patient was diagnosed with acute thalamic bleed  Patient was admitted to ICU, he was started on IV fluids and Cardene drip.  Patient's electrolytes were replaced.  Neurosurgery consult was done.  Neurosurgery recommended repeating CT of the head which showed no significant change.  No surgical intervention was recommended.  Patient stayed in the ICU for past few days.  Patient's condition stabilized he was transferred to floor.  PT/OT consult was done speech therapy consult was done.  Patient's condition management plan and discharge planning was discussed with patient significant other.  She requested skilled nursing facility.  Case manager consult was done to assist with discharge planning  Patient's  hospital course was prolonged due to difficulty finding a skilled nursing facility.  On the day of discharge patient was seen and examined patient's condition was unchanged she was laying comfortably vitals stable.  Patient eating drinking well following commands.  Pertinent labs.  Creatinine 4.2 baseline creatinine  3.6 most recent A1c 6.3, vitamin D18.4, B12 368.  Patient is being discharged to skilled nursing facility.  Copy of the discharge summary will be sent to patient's primary care provider    Dragon medical dictation software was used for portions of this report. Unintended errors may occur.      Hazel Sams, MD  March 05, 2021        Total time spent: Over 30 min.

## 2021-03-05 NOTE — Progress Notes (Signed)
Selected [facility] in [facility]. Called Emmi and left message asking for a call back about patient admitting there today.    Received call back from [facility] asking to have MAR, recent labs and covid results emailed to her. Ordered covid swab; will email when resulted. Emailed other requested clinicals to her.

## 2021-03-05 NOTE — Progress Notes (Signed)
 Progress  Notes by Dareen Mitzie RAMAN at 03/05/21 9072                Author: Dareen Mitzie RAMAN  Service: CASE MANAGEMENT  Author Type: Care Management       Filed: 03/05/21 1037  Date of Service: 03/05/21 9072  Status: Addendum          Editor: Dareen Mitzie RAMAN (Care Management)          Related Notes: Original Note by Dareen Mitzie RAMAN (Care Management) filed at 03/05/21 [phone]                    Medical Necessity Certification Statement for Non-Emergency Ambulance Services         SECTION 1 - GENERAL INFORMATION          Patient: Thomas Jackson  Age: 50 y.o.  Sex: male          Date of Birth: 1971/03/17  Admit Date: 02/24/2021  PCP: Trudy Alan Sheller, NP         MRN: [id number]   CSN: [id number]   Room: 6614/6614        Chief Complaint:      Chief Complaint       Patient presents with        ?  Aphagia        ?  Stroke         Height/Weight: 5'4Body mass index is 41.29 kg/m. Body mass index is 41.29 kg/m.   Weight:     Weight: 109.1 kg (240 lb 8.4 oz)   Weight Source:        Transport From:  [x]  Hospital    [] SNF   [] Residence   []  Other:    Transport To:      []  Hospital    [x] SNF   [] Residence   []  Other:    Address:    [facility], [address], [place], [place]      Medicare:      Insurance Information                                       [place] MED[place]ARE/[facility] MEDICARE A & B  Phone:  --             Subscriber:  Remington Belvie  Subscriber#:  [id number]       Group#:  --  Precert#:  --                      MEDICAID OF [place] [facility] MEDICAID OF [place]  Phone:  --             SubscriberShadrick, Senne  Subscriber#:  [id number]             Group#:  --  Precert#:  --                   Transport Date: March 05, 2021 (Valid for round trips this date, or for scheduled repetitive trips for 60 days from date signed below.)   Origin: [facility]   Destination:  @PATIENTADDRESS @      Is the Patient's stay covered under Medicare Part A (PPS/DRG?)  [x]   Yes    []  No      Closest appropriate facility?   [x]  Yes    []  No  If no, why was the patient transported to another facility?    If hospital to hospital transfer, describe services needed at 2nd facility not available at 1st facility:    If hospice Pt, is this transport related to Pt's terminal illness? []   Yes    []  No   Describe:       SECTION II - MEDICAL NECESSITY QUESTIONNAIRE   Ambulance transportation is medically necessary only if other means of transport are contraindicated or would be potentially    Harmful to the patient. To meet this requirement, the patient must be either bed confined or suffer from condition such    that transport by means other than an ambulance is contraindicated by the patient's condition.The following questions    must be addressed by the healthcare professional signing below for this form to be valid:      1) Describe the MEDICAL CONDITION space (physical and/or mental) of this patient AT THE TIME OF AMBULANCE TRANSPORT        that requires the patient to be transported in an ambulance, and why transport in an ambulance, and why transport by        other means is contraindicated by the patient's condition: stroke, cognitive deficits      2) Is this patient bed confined as defined below?  []   Yes    [x]  No            To be bed confined the patient must satisfy all 3 of the following criteria:            (1) unable to get up from bed without assistance; AND            (2) unable to ambulate; AND            (3) unable to sit in a chair or wheelchair.      3) Can this patient safely be transported by car or wheelchair van (I.e., may safely sit during transport,        without an attendant or monitoring?)      []  Yes     [x]  No      4) In addition to completing questions 1-3 above, please check any of the following conditions that apply*:        *Note: Supporting documentation for any boxes checked must be maintained in the patient's medical records      [] Contractures    [] Non-healed fractures    [x] Patient is confused      [] Patient is comatose     [] Moderate/severe pain on movement    [] Danger to self/others    [] IV meds/fluids required   [] PIV:   []  Yes    []   No   [] Patient is combative  [] Need, or possible need, for restraints    [] DVT requires elevation of a lower extremity    [x] Medical attendant required    [] Requires oxygen - unable to self-administer    [] Special handling/isolation/infection control precautions  required   [x] Unable to tolerate seated position for time needed  to transport    [] Hemodynamic monitoring required enroute     [] Unable to sit in a chair or wheelchair due to decubitus  ulcers or other wounds    [] Cardiac monitoring required enroute    [] Morbid obesity requires additional personnel/equipment  to safely handle patient     [] Orthopedic device (backboard, halo, pins, traction,  brace, wedge, etc.) requiring special handling during transport    [] Other (specificy)  SECTION III - SIGNATURE OF PHYSICIAN OR OTHER AUTHORIZED HEALTHCARE PROFESSIONAL   I certify that the above information is accurate based on my evaluation of this patient, and that the medical necessity provisions of 42 CFR 410.40 (e)(1) are met, requiring that this patient be transported  by ambulance.  I understand this information will be used by the centers for Medicare and Medicaid Services (CMS) to support the determination of medical necessity for ambulance services.  I represent that I am the beneficiary's attending physician; or  an employee of the beneficiary's attending physician, or the hospital or facility where the beneficiary is being treated and from which the beneficiary is being transported; that I have personal knowledge of the beneficiary's condition at the time of  transport; and that I meet all Medicare regulations and applicable state licensure laws for the credentialed indicated.      [x] If this box  is checked, I also certify that the patient is physically or mentally incapable of signing the ambulance services  claim form and that the institution with which I am affiliated has furnished care, services or assistance to the patient.  My signature  below was made on behalf of the patient pursuant to 74 RQM575.63(a)(5).  In accordance with  42 S8768503, the specific reason(s) that the patient is physically or mentally incapable of signing the claim form is as follows.        PATIENT DATA:     ED Attending: Cinderella Manila, MD       Visit Vitals      BP  130/74 (BP 1 Location: Right arm, BP Patient Position: Supine)     Pulse  70     Temp  98.1 F (36.7 C)     Resp  18     Ht  5' 4 (1.626 m)     Wt  109.1 kg (240 lb 8.4 oz)     SpO2  96%        BMI  41.29 kg/m            No past medical history on file.     Medications          Medications       naloxone (NARCAN) injection 0.1 mg (has no administration in time range)     ondansetron (ZOFRAN) injection 4 mg (has no administration in time range)     rosuvastatin (CRESTOR) tablet 10 mg (10 mg Oral Given 03/04/21 2209)     amLODIPine (NORVASC) tablet 10 mg (10 mg Oral Given 03/05/21 0906)     glucagon (GLUCAGEN) injection 1 mg (has no administration in time range)     levETIRAcetam (KEPPRA) tablet 500 mg (500 mg Oral Given 03/05/21 9367)     pantoprazole (PROTONIX) tablet 40 mg (40 mg Oral Given 03/05/21 0905)     thiamine HCL (B-1) tablet 100 mg (100 mg Oral Given 03/05/21 0905)     cyanocobalamin (VITAMIN B12) sublingual tablet 2,500 mcg (2,500 mcg SubLINGual Given 03/05/21 0905)     ergocalciferol capsule 50,000 Units (50,000 Units Oral Given 03/05/21 0905)     dextrose (D50W) injection syrg 5-25 g (has no administration in time range)     insulin lispro (HUMALOG) injection 1-100 Units (has no administration in time range)     hydrALAZINE (APRESOLINE) 20 mg/mL injection 20 mg (20 mg IntraVENous Given 02/27/21 0352)     hydrALAZINE (APRESOLINE) tablet 25 mg (25 mg Oral Given 03/05/21 0906)     metoprolol tartrate (LOPRESSOR)  tablet 50 mg (50 mg Oral Given 03/05/21 0906)     escitalopram  oxalate (LEXAPRO) tablet 5 mg (5 mg Oral Given 03/04/21 1737)     clonazePAM (KlonoPIN) tablet 0.25 mg (has no administration in time range)     labetaloL (NORMODYNE;TRANDATE) injection 5 mg (5 mg IntraVENous Given 02/26/21 0418)       LORazepam (ATIVAN) tablet 0.5 mg (0.5 mg Oral Given 03/01/21 9374)             Allergies          Allergies        Allergen  Reactions         ?  Amoxicillin  Other (comments)         ?  Penicillin G  Unknown (comments)             ED Course/Discharge                   ICD-10-CM  ICD-9-CM          1.  Thalamic hemorrhage (HCC)   I61.0  431            There are no discharge medications for this patient.              Electronically signed by:   Mitzie GORMAN Heron   March 05, 2021   9:28 AM         Name: Jacky Heron RN______________________________________________   Printed Name and Credentials Authorized Healthcare Professional (MD, DO, RN, etc)   *Form must be signed only by patient's attending physician for scheduled, repetitive transports.   For non-repetitive ambulance transports, if unable to obtain the signature of the attending physician,   Any of the following may sign (please check appropriate box below):         [] Physician Assistant   [] Clinical Nurse Specialist  [x]  Case Manager  [] Social Worker    [] Nurse Practitioner     [x] Registered Nurse             []  Discharge Planner         Dispatch Phone: [phone]   Dispatch Fax:     [phone]

## 2021-03-05 NOTE — Progress Notes (Signed)
Transport to: [facility]  Reason for transport: Confusion due to recent brain bleed, previous stroke, seizure disorder  Transport set up with: [facility]  Time/Date: 03/05/2021 @ 1300  D/C Summary loaded: Pending  Nurse/CM notified: Yes  Envelope delivered: Yes  Insurance verified on face sheet: Yes  Auth needed: No  Auth #:

## 2021-03-05 NOTE — Progress Notes (Signed)
Chaplain Follow-Up    Start Visit: 11:30  End Visit: 11:35    Chaplain conducted a Follow up consultation and Spiritual Assessment for Thomas Jackson, who is a 50 y.o.,male.      The Chaplain provided the following Interventions:  Continued a relationship of care and support.   Patient was asleep.  Offered prayer on patients behalf.   Chart reviewed.     The following outcomes where achieved:  Patient visit and prayer.                Assessment:  Patient has no known religious/cultural needs that will affect patient's preferences in health care.     Plan:  Chaplains will continue to follow and will provide pastoral care on an as needed/requested basis.  Chaplain charted for [employer].    Oswald Hillock [employer]  Pastoral Care  [phone]

## 2021-04-03 ENCOUNTER — Inpatient Hospital Stay: Admit: 2021-04-03 | Payer: MEDICARE | Primary: Family

## 2021-04-03 ENCOUNTER — Inpatient Hospital Stay
Admit: 2021-04-03 | Discharge: 2021-04-11 | Disposition: A | Discharge status: Skilled Nursing Facility | Payer: MEDICARE | Source: Other Acute Inpatient Hospital | Attending: Internal Medicine | Admitting: Internal Medicine

## 2021-04-03 ENCOUNTER — Inpatient Hospital Stay

## 2021-04-03 DIAGNOSIS — A419 Sepsis, unspecified organism: Secondary | ICD-10-CM

## 2021-04-03 LAB — CBC WITH AUTO DIFFERENTIAL
Basophils %: 0 % (ref 0–2)
Basophils Absolute: 0 10*3/uL (ref 0.0–0.1)
Eosinophils %: 1 % (ref 0–5)
Eosinophils Absolute: 0.2 10*3/uL (ref 0.0–0.4)
Granulocyte Absolute Count: 0 10*3/uL (ref 0.00–0.04)
Hematocrit: 30.5 % — ABNORMAL LOW (ref 36.0–48.0)
Hemoglobin: 10.3 g/dL — ABNORMAL LOW (ref 13.0–16.0)
Immature Granulocytes: 0 % (ref 0.0–0.5)
Lymphocytes %: 4 % — ABNORMAL LOW (ref 21–52)
Lymphocytes Absolute: 1 10*3/uL (ref 0.9–3.6)
MCH: 31.2 PG (ref 24.0–34.0)
MCHC: 33.8 g/dL (ref 31.0–37.0)
MCV: 92.4 FL (ref 78.0–100.0)
MPV: 9.4 FL (ref 9.2–11.8)
Metamyelocytes Relative: 1 %
Monocytes %: 2 % — ABNORMAL LOW (ref 3–10)
Monocytes Absolute: 0.5 10*3/uL (ref 0.05–1.2)
Myelocyte Percent: 1 %
NRBC Absolute: 0 10*3/uL (ref 0.00–0.01)
Neutrophils %: 91 % — ABNORMAL HIGH (ref 40–73)
Neutrophils Absolute: 22.3 10*3/uL — ABNORMAL HIGH (ref 1.8–8.0)
Nucleated RBCs: 0 PER 100 WBC
Platelet Comment: INCREASED
Platelets: 553 10*3/uL — ABNORMAL HIGH (ref 135–420)
RBC: 3.3 M/uL — ABNORMAL LOW (ref 4.35–5.65)
RDW: 12.3 % (ref 11.6–14.5)
WBC: 24.5 10*3/uL — ABNORMAL HIGH (ref 4.6–13.2)

## 2021-04-03 LAB — IRON AND TIBC
Iron Saturation: 39 % (ref 20–50)
Iron: 59 ug/dL (ref 50–175)
TIBC: 150 ug/dL — ABNORMAL LOW (ref 250–450)

## 2021-04-03 LAB — URINALYSIS W/ RFLX MICROSCOPIC
Bilirubin, Urine: NEGATIVE
Bilirubin: NEGATIVE
Blood, Urine: NEGATIVE
Blood: NEGATIVE
Glucose, Ur: NEGATIVE mg/dL
Glucose: NEGATIVE mg/dL
Ketone: NEGATIVE mg/dL
Ketones, Urine: NEGATIVE mg/dL
Nitrite, Urine: NEGATIVE
Nitrites: NEGATIVE
Protein, UA: 300 mg/dL — AB
Protein: 300 mg/dL — AB
Specific Gravity, UA: 1.011 (ref 1.005–1.030)
Specific gravity: 1.011 (ref 1.005–1.030)
Urobilinogen, UA, POCT: 0.2 EU/dL (ref 0.2–1.0)
Urobilinogen: 0.2 U/dL (ref 0.2–1.0)
pH (UA): 5 (ref 5.0–8.0)
pH, UA: 5 (ref 5.0–8.0)

## 2021-04-03 LAB — URINE MICROSCOPIC ONLY
RBC, UA: NEGATIVE /hpf (ref 0–5)
RBC: NEGATIVE /[HPF] (ref 0–5)
WBC, UA: 25 /hpf (ref 0–4)
WBC: 25 /[HPF] (ref 0–4)

## 2021-04-03 LAB — COMPREHENSIVE METABOLIC PANEL
ALT: 19 U/L (ref 16–61)
AST: 10 U/L (ref 10–38)
Albumin/Globulin Ratio: 0.5 — ABNORMAL LOW (ref 0.8–1.7)
Albumin: 2.3 g/dL — ABNORMAL LOW (ref 3.4–5.0)
Alkaline Phosphatase: 85 U/L (ref 45–117)
Anion Gap: 12 mmol/L (ref 3.0–18)
BUN: 104 MG/DL — ABNORMAL HIGH (ref 7.0–18)
Bun/Cre Ratio: 11 — ABNORMAL LOW (ref 12–20)
CO2: 17 mmol/L — ABNORMAL LOW (ref 21–32)
Calcium: 9.2 MG/DL (ref 8.5–10.1)
Chloride: 117 mmol/L — ABNORMAL HIGH (ref 100–111)
Creatinine: 9.73 MG/DL — ABNORMAL HIGH (ref 0.6–1.3)
EGFR IF NonAfrican American: 6 mL/min/{1.73_m2} — ABNORMAL LOW (ref >60)
GFR African American: 7 mL/min/{1.73_m2} — ABNORMAL LOW (ref >60)
Globulin: 4.4 g/dL — ABNORMAL HIGH (ref 2.0–4.0)
Glucose: 97 mg/dL (ref 74–99)
Potassium: 4.7 mmol/L (ref 3.5–5.5)
Sodium: 146 mmol/L — ABNORMAL HIGH (ref 136–145)
Total Bilirubin: 0.3 MG/DL (ref 0.2–1.0)
Total Protein: 6.7 g/dL (ref 6.4–8.2)

## 2021-04-03 LAB — COVID-19, RAPID: SARS-CoV-2, Rapid: NOT DETECTED

## 2021-04-03 LAB — PTH, INTACT
Calcium: 9.1 MG/DL (ref 8.5–10.1)
PTH: 111.3 pg/mL — ABNORMAL HIGH (ref 18.4–88.0)

## 2021-04-03 LAB — TSH 3RD GENERATION
TSH: 1.94 u[IU]/mL (ref 0.36–3.74)
TSH: 1.94 u[IU]/mL (ref 0.36–3.74)

## 2021-04-03 LAB — VITAMIN B12 & FOLATE
Folate: 13.5 ng/mL (ref 3.10–17.50)
Folate: 13.5 ng/mL (ref 3.10–17.50)
Vitamin B-12: 1212 pg/mL — ABNORMAL HIGH (ref 211–911)
Vitamin B12: 1212 pg/mL — ABNORMAL HIGH (ref 211–911)

## 2021-04-03 LAB — FERRITIN
Ferritin: 409 NG/ML — ABNORMAL HIGH (ref 8–388)
Ferritin: 409 NG/ML — ABNORMAL HIGH (ref 8–388)

## 2021-04-03 LAB — PROCALCITONIN
Procalcitonin: 1.02 ng/mL
Procalcitonin: 1.02 ng/mL

## 2021-04-03 LAB — POCT GLUCOSE: POC Glucose: 92 mg/dL (ref 70–110)

## 2021-04-03 LAB — CBC WITH AUTOMATED DIFF
ABS. BASOPHILS: 0 10*3/uL (ref 0.0–0.1)
ABS. EOSINOPHILS: 0.2 10*3/uL (ref 0.0–0.4)
ABS. IMM. GRANS.: 0 10*3/uL (ref 0.00–0.04)
ABS. LYMPHOCYTES: 1 10*3/uL (ref 0.9–3.6)
ABS. MONOCYTES: 0.5 10*3/uL (ref 0.05–1.2)
ABS. NEUTROPHILS: 22.3 10*3/uL — ABNORMAL HIGH (ref 1.8–8.0)
ABSOLUTE NRBC: 0 10*3/uL (ref 0.00–0.01)
BASOPHILS: 0 % (ref 0–2)
EOSINOPHILS: 1 % (ref 0–5)
HCT: 30.5 % — ABNORMAL LOW (ref 36.0–48.0)
HGB: 10.3 g/dL — ABNORMAL LOW (ref 13.0–16.0)
IMMATURE GRANULOCYTES: 0 % (ref 0.0–0.5)
LYMPHOCYTES: 4 % — ABNORMAL LOW (ref 21–52)
MCH: 31.2 pg (ref 24.0–34.0)
MCHC: 33.8 g/dL (ref 31.0–37.0)
MCV: 92.4 fL (ref 78.0–100.0)
METAMYELOCYTES: 1 %
MONOCYTES: 2 % — ABNORMAL LOW (ref 3–10)
MPV: 9.4 fL (ref 9.2–11.8)
MYELOCYTES: 1 %
NEUTROPHILS: 91 % — ABNORMAL HIGH (ref 40–73)
NRBC: 0 /100{WBCs}
PLATELET COMMENTS: INCREASED
PLATELET: 553 10*3/uL — ABNORMAL HIGH (ref 135–420)
RBC: 3.3 M/uL — ABNORMAL LOW (ref 4.35–5.65)
RDW: 12.3 % (ref 11.6–14.5)
WBC: 24.5 10*3/uL — ABNORMAL HIGH (ref 4.6–13.2)

## 2021-04-03 LAB — PTH INTACT
Calcium: 9.1 mg/dL (ref 8.5–10.1)
PTH, Intact: 111.3 pg/mL — ABNORMAL HIGH (ref 18.4–88.0)

## 2021-04-03 LAB — METABOLIC PANEL, COMPREHENSIVE
A-G Ratio: 0.5 — ABNORMAL LOW (ref 0.8–1.7)
ALT (SGPT): 19 U/L (ref 16–61)
AST (SGOT): 10 U/L (ref 10–38)
Albumin: 2.3 g/dL — ABNORMAL LOW (ref 3.4–5.0)
Alk. phosphatase: 85 U/L (ref 45–117)
Anion gap: 12 mmol/L (ref 3.0–18)
BUN/Creatinine ratio: 11 — ABNORMAL LOW (ref 12–20)
BUN: 104 MG/DL — ABNORMAL HIGH (ref 7.0–18)
Bilirubin, total: 0.3 mg/dL (ref 0.2–1.0)
CO2: 17 mmol/L — ABNORMAL LOW (ref 21–32)
Calcium: 9.2 mg/dL (ref 8.5–10.1)
Chloride: 117 mmol/L — ABNORMAL HIGH (ref 100–111)
Creatinine: 9.73 MG/DL — ABNORMAL HIGH (ref 0.6–1.3)
GFR est AA: 7 mL/min/{1.73_m2} — ABNORMAL LOW (ref >60)
GFR est non-AA: 6 mL/min/{1.73_m2} — ABNORMAL LOW (ref >60)
Globulin: 4.4 g/dL — ABNORMAL HIGH (ref 2.0–4.0)
Glucose: 97 mg/dL (ref 74–99)
Potassium: 4.7 mmol/L (ref 3.5–5.5)
Protein, total: 6.7 g/dL (ref 6.4–8.2)
Sodium: 146 mmol/L — ABNORMAL HIGH (ref 136–145)

## 2021-04-03 LAB — COVID-19 RAPID TEST: COVID-19 rapid test: NOT DETECTED

## 2021-04-03 LAB — IRON PROFILE
Iron % saturation: 39 % (ref 20–50)
Iron: 59 ug/dL (ref 50–175)
TIBC: 150 ug/dL — ABNORMAL LOW (ref 250–450)

## 2021-04-03 LAB — GLUCOSE, POC: Glucose (POC): 92 mg/dL (ref 70–110)

## 2021-04-03 MED ORDER — ROSUVASTATIN 10 MG TAB
10 mg | Freq: Every evening | ORAL | Status: DC
Start: 2021-04-03 — End: 2021-04-11
  Administered 2021-04-04 – 2021-04-10 (×7): via ORAL

## 2021-04-03 MED ORDER — ESCITALOPRAM 10 MG TAB
10 mg | Freq: Every evening | ORAL | Status: DC
Start: 2021-04-03 — End: 2021-04-11
  Administered 2021-04-04 – 2021-04-10 (×8): via ORAL

## 2021-04-03 MED ORDER — ENOXAPARIN 30 MG/0.3 ML SUB-Q SYRINGE
30 mg/0.3 mL | Freq: Every day | SUBCUTANEOUS | Status: DC
Start: 2021-04-03 — End: 2021-04-03

## 2021-04-03 MED ORDER — ACETAMINOPHEN 650 MG RECTAL SUPPOSITORY
650 mg | Freq: Four times a day (QID) | RECTAL | Status: DC | PRN
Start: 2021-04-03 — End: 2021-04-11

## 2021-04-03 MED ORDER — CYANOCOBALAMIN (VITAMIN B-12) 2,500 MCG SUBLINGUAL TAB
2500 mcg | Freq: Every day | SUBLINGUAL | Status: DC
Start: 2021-04-03 — End: 2021-04-11
  Administered 2021-04-04 – 2021-04-11 (×8): via ORAL

## 2021-04-03 MED ORDER — METOPROLOL TARTRATE 50 MG TAB
50 mg | Freq: Two times a day (BID) | ORAL | Status: DC
Start: 2021-04-03 — End: 2021-04-11
  Administered 2021-04-04 – 2021-04-11 (×14): via ORAL

## 2021-04-03 MED ORDER — SODIUM BICARBONATE 8.4 % IV
1 mEq/mL (8.4 %) | INTRAVENOUS | Status: AC
Start: 2021-04-03 — End: 2021-04-04
  Administered 2021-04-04: via INTRAVENOUS

## 2021-04-03 MED ORDER — POLYETHYLENE GLYCOL 3350 17 GRAM (100 %) ORAL POWDER PACKET
17 gram | Freq: Every day | ORAL | Status: DC | PRN
Start: 2021-04-03 — End: 2021-04-11

## 2021-04-03 MED ORDER — SUCCINYLCHOLINE CHLORIDE 100 MG/5 ML (20 MG/ML) IV SYRINGE
100 mg/5 mL (20 mg/mL) | INTRAVENOUS | Status: AC
Start: 2021-04-03 — End: ?

## 2021-04-03 MED ORDER — VECURONIUM BROMIDE 10 MG IV SOLR
10 mg | INTRAVENOUS | Status: AC
Start: 2021-04-03 — End: ?

## 2021-04-03 MED ORDER — ACETAMINOPHEN 325 MG TABLET
325 mg | Freq: Four times a day (QID) | ORAL | Status: DC | PRN
Start: 2021-04-03 — End: 2021-04-11

## 2021-04-03 MED ORDER — VANCOMYCIN IN 0.9% SODIUM CHLORIDE 1.5 G/500 ML IV
1.5 g/500 mL | Freq: Once | INTRAVENOUS | Status: AC
Start: 2021-04-03 — End: 2021-04-04
  Administered 2021-04-04: 02:00:00 via INTRAVENOUS

## 2021-04-03 MED ORDER — SODIUM CHLORIDE 0.9 % IV
INTRAVENOUS | Status: DC | PRN
Start: 2021-04-03 — End: 2021-04-03
  Administered 2021-04-03: 23:00:00 via INTRAVENOUS

## 2021-04-03 MED ORDER — ONDANSETRON (PF) 4 MG/2 ML INJECTION
4 mg/2 mL | Freq: Four times a day (QID) | INTRAMUSCULAR | Status: DC | PRN
Start: 2021-04-03 — End: 2021-04-11

## 2021-04-03 MED ORDER — LIDOCAINE (PF) 20 MG/ML (2 %) IJ SOLN
20 mg/mL (2 %) | INTRAMUSCULAR | Status: AC
Start: 2021-04-03 — End: ?

## 2021-04-03 MED ORDER — IODIXANOL 320 MG/ML IV SOLN
320 mg iodine/mL | INTRAVENOUS | Status: AC
Start: 2021-04-03 — End: ?

## 2021-04-03 MED ORDER — INSULIN LISPRO 100 UNIT/ML INJECTION
100 unit/mL | Freq: Four times a day (QID) | SUBCUTANEOUS | Status: DC
Start: 2021-04-03 — End: 2021-04-11
  Administered 2021-04-05: 21:00:00 via SUBCUTANEOUS

## 2021-04-03 MED ORDER — PROPOFOL 10 MG/ML IV EMUL
10 mg/mL | INTRAVENOUS | Status: AC
Start: 2021-04-03 — End: ?

## 2021-04-03 MED ORDER — ONDANSETRON 4 MG TAB, RAPID DISSOLVE
4 mg | Freq: Three times a day (TID) | ORAL | Status: DC | PRN
Start: 2021-04-03 — End: 2021-04-11
  Administered 2021-04-04: via ORAL

## 2021-04-03 MED ORDER — WATER FOR INJECTION, STERILE INJECTION
INTRAMUSCULAR | Status: AC
Start: 2021-04-03 — End: ?

## 2021-04-03 MED ORDER — CARVEDILOL 25 MG TAB
25 mg | Freq: Two times a day (BID) | ORAL | Status: DC
Start: 2021-04-03 — End: 2021-04-03

## 2021-04-03 MED ORDER — GLUCOSE 4 GRAM CHEWABLE TAB
4 gram | ORAL | Status: DC | PRN
Start: 2021-04-03 — End: 2021-04-11

## 2021-04-03 MED ORDER — HYDRALAZINE 50 MG TAB
50 mg | Freq: Three times a day (TID) | ORAL | Status: DC
Start: 2021-04-03 — End: 2021-04-03

## 2021-04-03 MED ORDER — DEXTROSE 10% IN WATER (D10W) IV
10 % | INTRAVENOUS | Status: DC | PRN
Start: 2021-04-03 — End: 2021-04-11

## 2021-04-03 MED ORDER — PROPOFOL 10 MG/ML IV EMUL
10 mg/mL | INTRAVENOUS | Status: DC | PRN
Start: 2021-04-03 — End: 2021-04-03
  Administered 2021-04-03: via INTRAVENOUS

## 2021-04-03 MED ORDER — HYDRALAZINE 50 MG TAB
50 mg | Freq: Three times a day (TID) | ORAL | Status: DC
Start: 2021-04-03 — End: 2021-04-06
  Administered 2021-04-04 – 2021-04-06 (×8): via ORAL

## 2021-04-03 MED ORDER — PHARMACY VANCOMYCIN NOTE
Status: DC
Start: 2021-04-03 — End: 2021-04-06

## 2021-04-03 MED ORDER — GLUCAGON 1 MG INJECTION
1 mg | INTRAMUSCULAR | Status: DC | PRN
Start: 2021-04-03 — End: 2021-04-11

## 2021-04-03 MED ORDER — HYDRALAZINE 25 MG TAB
25 mg | Freq: Three times a day (TID) | ORAL | Status: DC
Start: 2021-04-03 — End: 2021-04-03

## 2021-04-03 MED ORDER — ASPIRIN 81 MG TAB, DELAYED RELEASE
81 mg | Freq: Every day | ORAL | Status: DC
Start: 2021-04-03 — End: 2021-04-03

## 2021-04-03 MED ORDER — LIDOCAINE (PF) 20 MG/ML (2 %) IJ SOLN
20 mg/mL (2 %) | INTRAMUSCULAR | Status: DC | PRN
Start: 2021-04-03 — End: 2021-04-03
  Administered 2021-04-03: via INTRAVENOUS

## 2021-04-03 MED ORDER — PANTOPRAZOLE 40 MG TAB, DELAYED RELEASE
40 mg | Freq: Every day | ORAL | Status: DC
Start: 2021-04-03 — End: 2021-04-11
  Administered 2021-04-04 – 2021-04-11 (×7): via ORAL

## 2021-04-03 MED ORDER — THIAMINE HCL 100 MG TAB
100 mg | Freq: Every day | ORAL | Status: DC
Start: 2021-04-03 — End: 2021-04-11
  Administered 2021-04-04 – 2021-04-11 (×8): via ORAL

## 2021-04-03 MED ORDER — LEVETIRACETAM 500 MG/5 ML IV SOLN
5005 mg/5 mL | Freq: Two times a day (BID) | INTRAVENOUS | Status: DC
Start: 2021-04-03 — End: 2021-04-11
  Administered 2021-04-03 – 2021-04-11 (×16): via INTRAVENOUS

## 2021-04-03 MED ORDER — FENTANYL CITRATE (PF) 50 MCG/ML IJ SOLN
50 mcg/mL | INTRAMUSCULAR | Status: DC | PRN
Start: 2021-04-03 — End: 2021-04-03
  Administered 2021-04-03 (×2): via INTRAVENOUS

## 2021-04-03 MED ORDER — SODIUM CHLORIDE 0.9 % IJ SYRG
Freq: Three times a day (TID) | INTRAMUSCULAR | Status: DC
Start: 2021-04-03 — End: 2021-04-11
  Administered 2021-04-03 – 2021-04-11 (×23): via INTRAVENOUS

## 2021-04-03 MED ORDER — AMLODIPINE 10 MG TAB
10 mg | Freq: Every day | ORAL | Status: DC
Start: 2021-04-03 — End: 2021-04-05

## 2021-04-03 MED ORDER — CEFTRIAXONE 1 GRAM SOLUTION FOR INJECTION
1 gram | INTRAMUSCULAR | Status: DC
Start: 2021-04-03 — End: 2021-04-03
  Administered 2021-04-03: 21:00:00 via INTRAVENOUS

## 2021-04-03 MED ORDER — SODIUM CHLORIDE 0.9 % IJ SYRG
INTRAMUSCULAR | Status: DC | PRN
Start: 2021-04-03 — End: 2021-04-11

## 2021-04-03 MED ORDER — FENTANYL CITRATE (PF) 50 MCG/ML IJ SOLN
50 mcg/mL | INTRAMUSCULAR | Status: AC
Start: 2021-04-03 — End: ?

## 2021-04-03 MED FILL — VECURONIUM BROMIDE 10 MG IV SOLR: 10 mg | INTRAVENOUS | Qty: 10

## 2021-04-03 MED FILL — LEVETIRACETAM 500 MG/5 ML IV SOLN: 500 mg/5 mL | INTRAVENOUS | Qty: 5

## 2021-04-03 MED FILL — BD POSIFLUSH NORMAL SALINE 0.9 % INJECTION SYRINGE: INTRAMUSCULAR | Qty: 40

## 2021-04-03 MED FILL — XYLOCAINE-MPF 20 MG/ML (2 %) INJECTION SOLUTION: 20 mg/mL (2 %) | INTRAMUSCULAR | Qty: 5

## 2021-04-03 MED FILL — DEXTROSE 5% IN WATER (D5W) IV: INTRAVENOUS | Qty: 1000

## 2021-04-03 MED FILL — CEFTRIAXONE 1 GRAM SOLUTION FOR INJECTION: 1 gram | INTRAMUSCULAR | Qty: 1

## 2021-04-03 MED FILL — VISIPAQUE 320 MG IODINE/ML INTRAVENOUS SOLUTION: 320 mg iodine/mL | INTRAVENOUS | Qty: 100

## 2021-04-03 MED FILL — FENTANYL CITRATE (PF) 50 MCG/ML IJ SOLN: 50 mcg/mL | INTRAMUSCULAR | Qty: 2

## 2021-04-03 MED FILL — CYANOCOBALAMIN (VITAMIN B-12) 2,500 MCG SUBLINGUAL TAB: 2500 mcg | SUBLINGUAL | Qty: 1

## 2021-04-03 MED FILL — PROPOFOL 10 MG/ML IV EMUL: 10 mg/mL | INTRAVENOUS | Qty: 20

## 2021-04-03 MED FILL — SUCCINYLCHOLINE CHLORIDE 100 MG/5 ML (20 MG/ML) IV SYRINGE: 100 mg/5 mL (20 mg/mL) | INTRAVENOUS | Qty: 5

## 2021-04-03 MED FILL — PHARMACY VANCOMYCIN NOTE: Qty: 1

## 2021-04-03 MED FILL — WATER FOR INJECTION, STERILE INJECTION: INTRAMUSCULAR | Qty: 10

## 2021-04-03 NOTE — Anesthesia Pre-Procedure Evaluation (Signed)
Relevant Problems   NEUROLOGY   (+) Depression   (+) Seizure disorder (HCC)      CARDIOVASCULAR   (+) Essential hypertension      GASTROINTESTINAL   (+) GERD without esophagitis      RENAL FAILURE   (+) AKI (acute kidney injury) (HCC)   (+) CKD (chronic kidney disease) stage 4, GFR 15-29 ml/min (HCC)      ENDOCRINE   (+) Obesity   (+) Type 2 diabetes mellitus (HCC)      HEMATOLOGY   (+) Anemia       Anesthetic History   No history of anesthetic complications            Review of Systems / Medical History  Patient summary reviewed and pertinent labs reviewed    Pulmonary  Within defined limits                 Neuro/Psych     seizures    Psychiatric history    Comments: 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  On Keppra for Sz prophylaxis  Metabolic encephalopathy Cardiovascular    Hypertension              Exercise tolerance: <4 METS     GI/Hepatic/Renal     GERD    Renal disease: stones and CRI       Endo/Other    Diabetes: poorly controlled, type 2    Morbid obesity     Other Findings   Comments: urosepsis           Physical Exam    Airway  Mallampati: IV  TM Distance: 4 - 6 cm  Neck ROM: decreased range of motion   Mouth opening: Diminished (comment)     Cardiovascular  Regular rate and rhythm,  S1 and S2 normal,  no murmur, click, rub, or gallop             Dental    Dentition: Poor dentition     Pulmonary      Decreased breath sounds: bilateral           Abdominal  GI exam deferred       Other Findings            Anesthetic Plan    ASA: 4  Anesthesia type: general          Induction: Intravenous  Anesthetic plan and risks discussed with: Patient

## 2021-04-03 NOTE — Progress Notes (Signed)
Patient not in the OR for a cysto with stent per day shift nurse Dorinda Hill).        2020: received report from OR nurse. Patient received Fentanyl, 4mg  Zofran during procedure, per OR RN.  Full assessment conducted in ICU

## 2021-04-03 NOTE — Consults (Signed)
Consults by  Andrell Tallman, Belia Heman, MD at 04/03/21 1539                Author: Diaz Crago, Belia Heman, MD  Service: Urology  Author Type: Physician       Filed: 04/03/21 1919  Date of Service: 04/03/21 1539  Status: Addendum          Editor: Nargis Abrams, Belia Heman, MD (Physician)          Related Notes: Original Note by Sharlyne Pacas, PA (Physician Assistant) filed at 04/03/21 1701                             No diagnosis found.      ASSESSMENT:    Mild Left Hydronephrosis 2/2 62mm Left UPJ Stone on CT 04/02/21 from [place]   UTI   AKI    WBC 25K>23>20   Creat: 10.12<10.44>10.84>9.8 on 03/31/21    Baseline 3.6-4.6    UA 04/02/21: Positive Nitrite, WBC 20-50, RBC 1-2, many bacteria    PT 13.3, INR 1.1 on 04/02/21   Tmax 99      H/o Recent CVA/Hemorrhagic Stroke      PLAN:     Order UCX   Ct impression reviewed, left upj stone with hydronephrosis. No imaging available    to review.   Patient is septic with AKI. Recommend emergent cysto, L RGP, L JJ stent placement.    Keep NPO    Hold all ac    COVID test ordered.   Consent obtained from Girlfriend- Wynonia Lawman. Per girlfriend, she is listed on POA.   Continue ABX- Rocephin   Will follow.      Follow up arranged? No         Sharlyne Pacas PA-C   [facility]   Available M-Fri, Silvio Pate- 5PM   Pager: [phone]             Chart reviewed and patient examined independently. Additionally, I have addended the history, physical, assessment and plan above as needed in order to reflect my additional findings.  Agree with PA's assessment and plan as outlined.   Cysto, Left RPG, and Ureteral stent placement today   Cont IV ATBs      Molly Maduro Rey Fors MD    Urologic Oncologist   [facility] MD Professorship   Professor of Urology   [facility]   Office [phone] Ext [phone]       No chief complaint on file.         HISTORY OF PRESENT ILLNESS:  Thomas Jackson is a 50 y.o. male who is seen in consultation as referred by Dr.Nguyen  for  stones, hydronephrosis,  UTI. No past urological history noted besides urinary incontinence.       Patient with PMHX significant for recent brain bleed stroke, found to have increased AMS, unresponsive. [facility].  On presentation, WBC 24, creat of 10. Ct obtained, shows left UPJ stone 11 mm with hydronephrosis.  Patient then transferred to [facility] for urological consultation. Difficult to obtain history. Patient with word finding difficulties from stroke. Per girlfriend, patient with fever, chills for past couple of days. Patient with h/o urinary incontinence. Patient  has not seen a urologist before.            No flowsheet data found.  No past medical history on file.      No past surgical history on file.               Allergies        Allergen  Reactions         ?  Amoxicillin  Other (comments)         ?  Penicillin G  Unknown (comments)           No family history on file.        Current Facility-Administered Medications             Medication  Dose  Route  Frequency  Provider  Last Rate  Last Admin              ?  insulin lispro (HUMALOG) injection     SubCUTAneous  AC&HS  Randalyn Rhea, NP           ?  glucose chewable tablet 16 g   4 Tablet  Oral  PRN  Randalyn Rhea, NP                    ?  glucagon (GLUCAGEN) injection 1 mg   1 mg  IntraMUSCular  PRN  Randalyn Rhea, NP                    ?  dextrose 10% infusion 0-250 mL   0-250 mL  IntraVENous  PRN  Randalyn Rhea, NP                    ?  cefTRIAXone (ROCEPHIN) 1 g in sterile water (preservative free) 10 mL IV syringe   1 g  IntraVENous  Q24H  Randalyn Rhea, NP                 Review of Systems   ROS is:       Not obtainable due to patient factors , word finding difficulties, AMS               PHYSICAL EXAMINATION:    Visit Vitals      BP  (!) 178/78     Pulse  88     Temp  99 ??F (37.2 ??C)     Resp  19     Ht  5\' 3"  (1.6 m)     Wt  114.6 kg (252 lb 10.4 oz)     SpO2  96%        BMI  44.75 kg/m??         Constitutional: Well developed, well nourished male.  No acute distress.     HEENT: Normocephalic, Atraumatic, EOM's intact    CV:  no edema   Respiratory: No respiratory distress or difficulties breathing    Abdomen:  soft and non tender    GU Male:  No CVA tenderness   SCROTUM:  No scrotal rash or lesions noticed.  Normal bilateral testes and epididymis.    PENIS: Urethral meatus buried. Foley in place with clear, yellow urine. No urethral discharge   Skin: No evidence of jaundice.  Normal color   Neuro/Psych:  Drowsy, word finding difficulties         REVIEW OF LABS AND IMAGING:      CT   04/02/21:      IMPRESSION:   1.  Previously seen nonobstructing 11 mm left lower pole calculus has migrated to the left UPJ resulting in mild  hydronephrosis.   2.  Possible punctate  cholelithiasis is again demonstrated without evidence of cholecystitis.   3.  Additional findings as above.                       Reading Doctor: Jefm Petty     Electronic Signature by: Jefm Petty   Narrative               Labs:  Results:        Chemistry   No results for input(s): GLU, NA, K, CL, CO2, BUN, CREA, CA, AGAP, BUCR, TBIL, AP, TP, ALB, GLOB, AGRAT in the last 72 hours.      No lab exists for component: GPT     CBC w/Diff  No results for input(s): WBC, RBC, HGB, HCT, PLT, GRANS, LYMPH, EOS, HGBEXT, HCTEXT, PLTEXT in the last 72 hours.        Cultures  No results for input(s): CULT in the last 72 hours.   All Micro Results            Procedure  Component  Value  Units  Date/Time        CULTURE, MRSA [[id number]]          Order Status: Sent  Specimen: Nasal from Nares                                  Urinalysis  Potassium      Date  Value  Ref Range  Status      03/05/2021  4.8 (H)  3.4 - 4.5 mEq/L  Final            Creatinine      Date  Value  Ref Range  Status      03/05/2021  4.27 (H)  0.70 - 1.30 mg/dl  Final            BUN      Date  Value  Ref Range  Status      03/05/2021  55 (H)  9 - 23 mg/dl  Final                 PSA  No results  for input(s): PSA in the last 72 hours.        Coagulation  Lab Results      Component  Value  Date/Time        Prothrombin time  12.1  02/24/2021 06:18 PM        INR  1.0  02/24/2021 06:18 PM

## 2021-04-03 NOTE — Op Note (Signed)
04/03/2021, 8:11 PM    Preoperative Diagnosis:  Septic obstructing Left UPJ stone    Postoperative Diagnosis: Same    Procedure:  Cystoscopy, Left Retrograde Pyelograms, Left Double J Stent    Surgeon(s):  Belia Heman Melenie Minniear, MD     Anesthesia:  general anesthesia    EBL:  None    Tubes and Drains:  Left 6 x 26 JJ stent    Specimen(s):  Urine Cx Left kidney    Complications:  None    Description of Procedure:  The patient was identified and surgical site verification was performed prior to going to OR.  Consent was confirmed.  The patient was brought to the cystoscopy suite.  Under adequate anesthesia the patient was positioned in dorsal lithotomy and prepped and draped.  A preoperative Time Out was performed addressing the anticipated surgical site, procedure, and safety precautions. Appropriate DVT prophylaxis and antibiotic coverage was assured.    The 21 Fr cystoscope was inserted into the patient's urethral meatus and advanced to the bladder.  The anterior urethra was normal.   Prostate was nonobstructing.    The bladder was inspected and the entire mucosal surface was confirmed free of tumors or other pathology.  The ureteral orifices were in their normal anatomic positions.     The Left ureteral orifice intubated with a ureteral catheter and contrast was injected in retrograde fashion up the ureter.  Findings were as follows: normal ureter up to radioopaque Left UPJ stone with mild prox hydro.  Cultures were obtained above the level of obstruction by the open-ended ureteral catheter which was advanced over a wire after proximal passage of a glidewire in an atraumatic fashion.    Next, a glidewire was passed up to the kidney.  Over the glidewire, a jj stent measuring 6Fr  x  26 cm was positioned in the ureter.  The wire was removed and the stent was deployed in good position with proximal curl in the kidney and distal curl in the bladder.  A string was not left attached to the stent.    The patient's bladder was  drained with a 16 Fr foley.  The cystoscope was removed.  The patient was awakened and transferred to the ICU.    PLAN:  Broad Spectrum ATBs: Would consider broadening from just Rocephin. PUS IN KIDNEY  Foley until AF   Definitive Stone Management in future  Molly Maduro Ladon Heney MD   Urologic Oncologist  [facility] MD Professorship  Professor of Urology  [employer]  Office [phone] Ext [phone]

## 2021-04-03 NOTE — Progress Notes (Signed)
Patient is alert, bed alarm on, call bell within patient's reach.

## 2021-04-03 NOTE — Progress Notes (Signed)
 [facility]   Pharmacy Pharmacokinetic Monitoring Service - Vancomycin     Thomas Jackson is a 50 y.o. male starting on vancomycin therapy for UTI. Pharmacy consulted by Dr. Enis Miyamoto, MD for monitoring and adjustment.    Target Concentration: Dosing based on anticipated concentration <15 mg/L due to renal impairment/insufficiency    Additional Antimicrobials: Ceftriaxone    Pertinent Laboratory Values:   Wt Readings from Last 1 Encounters:   04/03/21 114.6 kg (252 lb 10.4 oz)     Temp Readings from Last 1 Encounters:   04/03/21 99 F (37.2 C)     No components found for: PROCAL  Estimated Creatinine Clearance: 10.3 mL/min (A) (based on SCr of 9.73 mg/dL (H)).  Recent Labs     04/03/21  1559   WBC 24.5*       Pertinent Cultures:  Culture Date Source Results   8/25 Urine In process   MRSA Nasal Swab: N/A. Non-respiratory infection..    Plan:  Concentration-guided dosing due to renal impairment/insufficiency  Start vancomycin 1.5 gm loading dose  Renal labs as indicated   Vancomycin concentration ordered for 8/26 @ 0400   Pharmacy will continue to monitor patient and adjust therapy as indicated    Thank you for the consult,  Jacqueline Myers, [facility]  04/03/2021 7:56 PM

## 2021-04-03 NOTE — Progress Notes (Signed)
Problem: Pressure Injury - Risk of  Goal: *Prevention of pressure injury  Description: Document Braden Scale and appropriate interventions in the flowsheet.  Outcome: Progressing Towards Goal  Note: Pressure Injury Interventions:  Sensory Interventions: Assess need for specialty bed, Assess changes in LOC, Avoid rigorous massage over bony prominences, Keep linens dry and wrinkle-free, Float heels, Minimize linen layers, Maintain/enhance activity level, Pressure redistribution bed/mattress (bed type), Turn and reposition approx. every two hours (pillows and wedges if needed), Pad between skin to skin    Moisture Interventions: Absorbent underpads, Apply protective barrier, creams and emollients, Check for incontinence Q2 hours and as needed, Maintain skin hydration (lotion/cream), Minimize layers, Moisture barrier    Activity Interventions: Assess need for specialty bed, Pressure redistribution bed/mattress(bed type)    Mobility Interventions: Assess need for specialty bed, Pressure redistribution bed/mattress (bed type), Turn and reposition approx. every two hours(pillow and wedges)    Nutrition Interventions: Document food/fluid/supplement intake, Discuss nutritional consult with provider, Offer support with meals,snacks and hydration                     Problem: Falls - Risk of  Goal: *Absence of Falls  Description: Document Bridgette Habermann Fall Risk and appropriate interventions in the flowsheet.  Outcome: Progressing Towards Goal  Note: Fall Risk Interventions:       Mentation Interventions: Adequate sleep, hydration, pain control, Bed/chair exit alarm, Door open when patient unattended, Evaluate medications/consider consulting pharmacy, More frequent rounding, Reorient patient, Room close to nurse's station, Toileting rounds    Medication Interventions: Assess postural VS orthostatic hypotension, Evaluate medications/consider consulting pharmacy, Bed/chair exit alarm    Elimination Interventions: Bed/chair exit alarm,  Call light in reach, Elevated toilet seat, Toileting schedule/hourly rounds              Problem: Patient Education: Go to Patient Education Activity  Goal: Patient/Family Education  Outcome: Progressing Towards Goal

## 2021-04-03 NOTE — H&P (Signed)
H&P by Durward Mallard,  NP at 04/03/21 1420                Author: Durward Mallard, NP  Service: Hospitalist  Author Type: Nurse Practitioner       Filed: 04/03/21 1926  Date of Service: 04/03/21 1420  Status: Attested           Editor: Durward Mallard, NP (Nurse Practitioner)  Cosigner: Carrolyn Meiers, MD at 04/03/21 2328          Attestation signed by Carrolyn Meiers, MD at 04/03/21 2328          I have personally saw and examined the patient. I have reviewed and agreed with Thomas Jackson's findings, including all diagnostic interpretations, and plans  as written.        Patient was seen earlier, he expressed that he feels somewhat better however he still confused.  He only know that he is in the hospital.  He was transferred from [facility] this morning after he was evaluated there for unresponsiveness.  Work-up  in the ED showed negative CT head, positive UA, CT abdomen and pelvic with left ureter stone with mild hydronephrosis.  WBC 25K.  He was started on ceftriaxone.  Creatinine was elevated.  K was elevated.      He was seen by urology, and was scheduled for OR with emergent cystogram and likely stent placement.         ROS:  No fever/chills, no headache, no dizziness, no facial pain,    No swallowing pain, No chest pain, no palpitation, no shortness of breath, no abd pain,   No diarrhea, no urinary complaint, no leg pain or swelling       Visit Vitals   BP  (!) 154/58      Pulse  (!) 102      Temp  98.5 ??F (36.9 ??C)      Resp  20      Ht  5' 3" (1.6 m)      Wt  109 kg (240 lb 4.8 oz)      SpO2  99%      BMI  42.57 kg/m??            Exam:   Tele: sinus    General:  Cooperative, Not in acute distress   HEENT: PERRL, supple neck, no JVD, left facial droop   Cardiovascular: S1S2 tachycardia, no rub/gallop    Pulmonary: air entry bilaterally, no wheezing, + crackle   GI:  Soft, non tender, non distended, +bs, no guarding    Extremities:  + pedal edema, +distal pulses appreciated    Neuro: AOx2,  moving all extremities, aphasia      Lab Results      Component  Value  Date/Time        WBC  24.5 (H)  04/03/2021 03:59 PM        HGB  10.3 (L)  04/03/2021 03:59 PM        HCT  30.5 (L)  04/03/2021 03:59 PM        PLATELET  553 (H)  04/03/2021 03:59 PM        MCV  92.4  04/03/2021 03:59 PM            Lab Results      Component  Value  Date/Time        Sodium  146 (H)  04/03/2021 03:59 PM        Potassium  4.7  04/03/2021 03:59 PM        Chloride  117 (H)  04/03/2021 03:59 PM        CO2  17 (L)  04/03/2021 03:59 PM        Anion gap  12  04/03/2021 03:59 PM        Glucose  97  04/03/2021 03:59 PM        BUN  104 (H)  04/03/2021 03:59 PM        Creatinine  9.73 (H)  04/03/2021 03:59 PM        BUN/Creatinine ratio  11 (L)  04/03/2021 03:59 PM        GFR est AA  7 (L)  04/03/2021 03:59 PM        GFR est non-AA  6 (L)  04/03/2021 03:59 PM        Calcium  9.2  04/03/2021 03:59 PM        Calcium  9.1  04/03/2021 03:59 PM        Bilirubin, total  0.3  04/03/2021 03:59 PM        Alk. phosphatase  85  04/03/2021 03:59 PM        Protein, total  6.7  04/03/2021 03:59 PM        Albumin  2.3 (L)  04/03/2021 03:59 PM        Globulin  4.4 (H)  04/03/2021 03:59 PM        A-G Ratio  0.5 (L)  04/03/2021 03:59 PM        ALT (SGPT)  19  04/03/2021 03:59 PM        AST (SGOT)  10  04/03/2021 03:59 PM            No current facility-administered medications on file prior to encounter.            Current Outpatient Medications on File Prior to Encounter      Medication  Sig  Dispense  Refill      ?  hydrALAZINE (APRESOLINE) 50 mg tablet  Take 50 mg by mouth three (3) times daily.          ?  amLODIPine (NORVASC) 10 mg tablet  Take 1 Tablet by mouth in the morning.  30 Tablet  0      ?  cyanocobalamin (VITAMIN B12) 2,500 mcg sublingual tablet  Take 1 Tablet by mouth in the morning.  30 Tablet  0      ?  ergocalciferol (ERGOCALCIFEROL) 1,250 mcg (50,000 unit) capsule  Take 1 Capsule by mouth every seven (7) days.  4 Capsule  0      ?   escitalopram oxalate (LEXAPRO) 5 mg tablet  Take 1 Tablet by mouth every evening.  30 Tablet  0      ?  levETIRAcetam (KEPPRA) 500 mg tablet  Take 1 Tablet by mouth two (2) times a day.  60 Tablet  0      ?  metoprolol tartrate (LOPRESSOR) 50 mg tablet  Take 1 Tablet by mouth two (2) times a day.  60 Tablet  0      ?  pantoprazole (PROTONIX) 40 mg tablet  Take 1 Tablet by mouth Daily (before breakfast).  30 Tablet  0      ?  rosuvastatin (CRESTOR) 10 mg tablet  Take 1 Tablet by mouth nightly.  30 Tablet  0      ?  thiamine HCL (B-1) 100 mg tablet  Take 1 Tablet by mouth in the morning.  30 Tablet  0            AP:   1.  Sepsis POA (HR 449, WBC 67R, complicated UTI)   2.  Complicated UTI   3.  Obstructive uropathy, left renal stone with hydronephrosis.  Status post cystoscopy, stent placement by urology   4.  AKI on CKD 4, metabolic acidosis   5.  Uremia   6.  Hypernatremia, decreased free water intake   7.  Hypertension   8.  Leukocytosis   9.  Thrombocytosis, reactive   10.  Normocytic anemia   11.  History of intracranial hemorrhage, stable   12.  Acute metabolic encephalopathy due to multifactorial including sepsis, uremia, hypernatremia,   13.  Seizure disorder, stable   14.  Type 2 diabetes mellitus      He continues have persistent leukocytosis, tachycardia, therefore we will add vancomycin, switch ceftriaxone to cefepime for Pseudomonas coverage.   Blood culture follow-up, intraoperative culture follow-up.   Started bicarb drip, D5W added   Nephrology consulted   Urology consult appreciated   Resume his Ester his metoprolol   ISS   Seizure precaution as needed   PT OT      Full code      Time spent >60 minutes   Signed By:  Carrolyn Meiers, MD         April 03, 2021                                                 History and Physical                Subjective        HPI: Thomas Jackson is a 50 y.o. male with a PMHx of Intracranial Hemorrhage, CKD stage 4, metabolic encephalopathy, depression, HTN, GERD,   hyperlipidemia, seizure disorder, T2DM and Vit D deficiency who presented to [facility] from [facility]. Pt went in from [place] where he resides after having Intracranial Hemorrhage on July 18, 22. Pt was found to be unresponsive at the nursing  home and subsequently sent to the ED. Pt was not cooperative and was difficult to arouse so further work-up was done. At the ED pt had a positive UA, CXR negative for infiltrates, Covid negative. CT Head revealed no new hemorrhage, CT Abd and Pelvis revealed  mild hydronephrosis due to left ureter stone. Furthermore, most recent blood work shows WBC 25.75, Hgb 10.9, Hct 32.2, Plt 553, K 5.9, BUN 113, Cr 10.13, Albumin 2.2. EKG NSR. Pt was transferred to [facility] for further evaluation and management.       Pt was seen today and is found to be a poor historian. He is not able to answer any questions and grimaces when touched anywhere.       PMH and Medications reconciled per nursing home paperwork.          PMHx:     Past Medical History:        Diagnosis  Date         ?  CKD (chronic kidney disease) stage 4, GFR 15-29 ml/min (HCC)  04/03/2021     ?  Depression  04/03/2021     ?  Essential hypertension  04/03/2021     ?  GERD  without esophagitis  04/03/2021     ?  Intracerebral hemorrhage ([place])  02/24/2021     ?  Intracerebral hemorrhage, nontraumatic (HCC)       ?  Metabolic encephalopathy  02/26/9469     ?  Mixed hyperlipidemia  04/03/2021     ?  Obesity  04/03/2021     ?  Seizure disorder ([place])  04/03/2021     ?  Type 2 diabetes mellitus ([place])  04/03/2021         ?  Vitamin D deficiency  04/03/2021           PSurgHx:   Not able to obtain due to AMS      SocialHx:     Social History          Socioeconomic History         ?  Marital status:  SINGLE           FamilyHx:   Not able to obtain due to AMS       Home Medications:     Prior to Admission Medications     Prescriptions  Last Dose  Informant  Patient Reported?  Taking?      amLODIPine (NORVASC) 10 mg tablet      No  No       Sig: Take 1 Tablet by mouth in the morning.      cyanocobalamin (VITAMIN B12) 2,500 mcg sublingual tablet      No  No      Sig: Take 1 Tablet by mouth in the morning.      ergocalciferol (ERGOCALCIFEROL) 1,250 mcg (50,000 unit) capsule      No  No      Sig: Take 1 Capsule by mouth every seven (7) days.      escitalopram oxalate (LEXAPRO) 5 mg tablet      No  No      Sig: Take 1 Tablet by mouth every evening.      hydrALAZINE (APRESOLINE) 25 mg tablet      No  No      Sig: Take 1 Tablet by mouth three (3) times daily.      levETIRAcetam (KEPPRA) 500 mg tablet      No  No      Sig: Take 1 Tablet by mouth two (2) times a day.      metoprolol tartrate (LOPRESSOR) 50 mg tablet      No  No      Sig: Take 1 Tablet by mouth two (2) times a day.      pantoprazole (PROTONIX) 40 mg tablet      No  No      Sig: Take 1 Tablet by mouth Daily (before breakfast).      rosuvastatin (CRESTOR) 10 mg tablet      No  No      Sig: Take 1 Tablet by mouth nightly.      thiamine HCL (B-1) 100 mg tablet      No  No      Sig: Take 1 Tablet by mouth in the morning.               Facility-Administered Medications: None           Allergies:     Allergies        Allergen  Reactions         ?  Amoxicillin  Other (comments)         ?  Penicillin G  Unknown (comments)            Review of Systems:   Not able to obtain due to AMS            Objective        Physical Exam:   Visit Vitals      BP  (!) 178/78     Pulse  100     Temp  99 ??F (37.2 ??C)     Resp  19     Ht  5' 3" (1.6 m)     Wt  114.6 kg (252 lb 10.4 oz)     SpO2  96%        BMI  44.75 kg/m??           General: NAD, appears stated age, altered/encephalopathic   Skin: warm, dry, no rashes   Eyes: PERRL, sclera is non-icteric   HENT: normocephalic/atraumatic, moist mucus membranes   Respiratory: CTA with no signs of respiratory distress   Cardiovascular: RRR, no m/r/g, no cyanosis or peripheral edema of extremities   GI: soft, normal bowel sounds, grimace noted on face with palpation      Neuro: moves all extremities, aphasia      Laboratory Studies:     Recent Results (from the past 24 hour(s))     METABOLIC PANEL, COMPREHENSIVE          Collection Time: 04/03/21  3:59 PM         Result  Value  Ref Range            Sodium  146 (H)  136 - 145 mmol/L       Potassium  4.7  3.5 - 5.5 mmol/L       Chloride  117 (H)  100 - 111 mmol/L       CO2  17 (L)  21 - 32 mmol/L       Anion gap  12  3.0 - 18 mmol/L       Glucose  97  74 - 99 mg/dL       BUN  104 (H)  7.0 - 18 MG/DL       Creatinine  9.73 (H)  0.6 - 1.3 MG/DL       BUN/Creatinine ratio  11 (L)  12 - 20         GFR est AA  7 (L)  >60 ml/min/1.55m       GFR est non-AA  6 (L)  >60 ml/min/1.786m      Calcium  9.2  8.5 - 10.1 MG/DL       Bilirubin, total  0.3  0.2 - 1.0 MG/DL       ALT (SGPT)  19  16 - 61 U/L       AST (SGOT)  10  10 - 38 U/L       Alk. phosphatase  85  45 - 117 U/L       Protein, total  6.7  6.4 - 8.2 g/dL       Albumin  2.3 (L)  3.4 - 5.0 g/dL       Globulin  4.4 (H)  2.0 - 4.0 g/dL       A-G Ratio  0.5 (L)  0.8 - 1.7         PTH INTACT          Collection Time: 04/03/21  3:59 PM  Result  Value  Ref Range            Calcium  9.1  8.5 - 10.1 MG/DL       PTH, Intact  111.3 (H)  18.4 - 88.0 pg/mL       CBC WITH AUTOMATED DIFF          Collection Time: 04/03/21  3:59 PM         Result  Value  Ref Range            WBC  24.5 (H)  4.6 - 13.2 K/uL       RBC  3.30 (L)  4.35 - 5.65 M/uL       HGB  10.3 (L)  13.0 - 16.0 g/dL       HCT  30.5 (L)  36.0 - 48.0 %       MCV  92.4  78.0 - 100.0 FL       MCH  31.2  24.0 - 34.0 PG       MCHC  33.8  31.0 - 37.0 g/dL       RDW  12.3  11.6 - 14.5 %       PLATELET  553 (H)  135 - 420 K/uL       MPV  9.4  9.2 - 11.8 FL       NRBC  0.0  0 PER 100 WBC       ABSOLUTE NRBC  0.00  0.00 - 0.01 K/uL       NEUTROPHILS  PENDING  %       LYMPHOCYTES  PENDING  %       MONOCYTES  PENDING  %       EOSINOPHILS  PENDING  %       BASOPHILS  PENDING  %       IMMATURE GRANULOCYTES  PENDING  %       ABS. NEUTROPHILS   PENDING  K/UL       ABS. LYMPHOCYTES  PENDING  K/UL       ABS. MONOCYTES  PENDING  K/UL       ABS. EOSINOPHILS  PENDING  K/UL       ABS. BASOPHILS  PENDING  K/UL       ABS. IMM. GRANS.  PENDING  K/UL       DF  PENDING         PROCALCITONIN          Collection Time: 04/03/21  3:59 PM         Result  Value  Ref Range            Procalcitonin  1.02  ng/mL       TSH 3RD GENERATION          Collection Time: 04/03/21  3:59 PM         Result  Value  Ref Range            TSH  1.94  0.36 - 3.74 uIU/mL       URINALYSIS W/ RFLX MICROSCOPIC          Collection Time: 04/03/21  4:38 PM         Result  Value  Ref Range            Color  YELLOW          Appearance  CLOUDY          Specific gravity  1.011  1.005 -  1.030         pH (UA)  5.0  5.0 - 8.0         Protein  300 (A)  NEG mg/dL       Glucose  Negative  NEG mg/dL       Ketone  Negative  NEG mg/dL       Bilirubin  Negative  NEG         Blood  Negative  NEG         Urobilinogen  0.2  0.2 - 1.0 EU/dL       Nitrites  Negative  NEG         Leukocyte Esterase  MODERATE (A)  NEG         URINE MICROSCOPIC ONLY          Collection Time: 04/03/21  4:38 PM         Result  Value  Ref Range            WBC  25 to 30  0 - 4 /hpf       RBC  Negative  0 - 5 /hpf       Epithelial cells  FEW  0 - 5 /lpf       Bacteria  1+ (A)  NEG /hpf       Amorphous Crystals  1+ (A)  NEG       GLUCOSE, POC          Collection Time: 04/03/21  5:55 PM         Result  Value  Ref Range            Glucose (POC)  92  70 - 110 mg/dL           Imaging Reviewed:   No results found.              Assessment/Plan        Obstructive Uropathy-  from Ureteral Stone   - Consulted Urology- appreciate assistance   - NPO   - Emergent Cysto per urology      Sepsis secondary to UTI- tachycardia, tachypnea, leukocytosis   - IV Vanc   - Blood Cultures   - monitor CBC   - Lactic Acid          Intracerebral Hemorrhage- CT yesterday with no new hemorrhage- right basal ganglia hemorrhage essentially resolved   - PT/OT         AKI on  CKD [place] 4- Acute on chronic AKI, Elevated BUN and Cr likely from hydronephrosis   - Sodium Bicarb gtt   - Nephrology consulted- appreciate assistance   - monitor BMP   - monitor strict I&O      Seizure Disorder-    - continue IV Keppra    - Seizure Precaution      T2DM-   - Sliding scale- AC & HS   - HbA1c ordered       UTI, Metabolic Encephalopathy- possibly due to UTI/Uremia   - IV Ceftriaxone    - Urine culture        Depression- continue Lexapro        Essential HTN-    - Hydralazine 28m TID    - Metoprolol 565mBID    - Amlodipine held- continue if BP still high        Hyperlipidemia-    - Check lipid panel in the AM     - Cont Crestor as ordered  Anemia-     - monitor CBC    - Add on Iron profile, Vit B12 and Ferritin             Anticipated Discharge: >2 days      DVT Prophylaxis:  []Lovenox  []Hep SQ  [x] SCDs  []Coumadin []DOAC  [] On Heparin gtt       Time spent reviewing records, independently interpreting results, obtaining history from patient or caregiver, performing physical exam, ordering tests and medications, communicating with specialists, documenting in the chart, and coordinating overall  care is  >75 minutes       Durward Mallard, FNP-C   [place]   Office:  [phone]

## 2021-04-03 NOTE — Anesthesia Post-Procedure Evaluation (Signed)
Procedure(s):  CYSTOSCOPY WITH LEFT RETROGRADES AND LEFT DOUBLE J STENT PLACEMENT/ C-ARM.    general    Anesthesia Post Evaluation      Multimodal analgesia: multimodal analgesia used between 6 hours prior to anesthesia start to PACU discharge  Patient location during evaluation: ICU  Patient participation: complete - patient participated  Level of consciousness: lethargic  Pain score: 3  Airway patency: patent  Anesthetic complications: no  Cardiovascular status: acceptable  Respiratory status: acceptable  Hydration status: acceptable  Post anesthesia nausea and vomiting:  none  Final Post Anesthesia Temperature Assessment:  Normothermia (36.0-37.5 degrees C)      INITIAL Post-op Vital signs:   Vitals Value Taken Time   BP 139/55 04/03/21 2130   Temp 36.9 ??C (98.5 ??F) 04/03/21 2020   Pulse 103 04/03/21 2142   Resp 20 04/03/21 2142   SpO2 99 % 04/03/21 2142   Vitals shown include unvalidated device data.

## 2021-04-03 NOTE — Progress Notes (Signed)
Direct admit from the [facility].    Arrived at 1300 via transport. Patient states he doesn't know where he is or why he is here, alert to self. He states he can not remember his NOK.    Per EMS (transport) patient was alert and oriented times 4 on the way here with no complications. Report from Erie Noe, RN) patient is alert and oriented times 4. 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    Vital signs are stable. TThe patient's skin is intact. The patient complains of pain when touching his feet and foley catheter. The patient was also yelling out in pain when no one was touching him.

## 2021-04-04 LAB — BASIC METABOLIC PANEL
Anion Gap: 11 mmol/L (ref 3.0–18)
BUN: 98 MG/DL — ABNORMAL HIGH (ref 7.0–18)
Bun/Cre Ratio: 10 — ABNORMAL LOW (ref 12–20)
CO2: 19 mmol/L — ABNORMAL LOW (ref 21–32)
Calcium: 8.4 MG/DL — ABNORMAL LOW (ref 8.5–10.1)
Chloride: 116 mmol/L — ABNORMAL HIGH (ref 100–111)
Creatinine: 9.42 MG/DL — ABNORMAL HIGH (ref 0.6–1.3)
EGFR IF NonAfrican American: 6 mL/min/{1.73_m2} — ABNORMAL LOW (ref >60)
GFR African American: 7 mL/min/{1.73_m2} — ABNORMAL LOW (ref >60)
Glucose: 125 mg/dL — ABNORMAL HIGH (ref 74–99)
Potassium: 5.2 mmol/L (ref 3.5–5.5)
Sodium: 146 mmol/L — ABNORMAL HIGH (ref 136–145)

## 2021-04-04 LAB — CBC
Hematocrit: 29.1 % — ABNORMAL LOW (ref 36.0–48.0)
Hemoglobin: 9.7 g/dL — ABNORMAL LOW (ref 13.0–16.0)
MCH: 31.9 PG (ref 24.0–34.0)
MCHC: 33.3 g/dL (ref 31.0–37.0)
MCV: 95.7 FL (ref 78.0–100.0)
MPV: 9.4 FL (ref 9.2–11.8)
NRBC Absolute: 0 10*3/uL (ref 0.00–0.01)
Nucleated RBCs: 0 PER 100 WBC
Platelets: 548 10*3/uL — ABNORMAL HIGH (ref 135–420)
RBC: 3.04 M/uL — ABNORMAL LOW (ref 4.35–5.65)
RDW: 12.4 % (ref 11.6–14.5)
WBC: 25.7 10*3/uL — ABNORMAL HIGH (ref 4.6–13.2)

## 2021-04-04 LAB — CULTURE, MRSA

## 2021-04-04 LAB — LIPID PANEL
CHOL/HDL Ratio: 3 (ref 0–5.0)
Chol/HDL Ratio: 3 (ref 0–5.0)
Cholesterol, Total: 89 MG/DL (ref <200)
Cholesterol, total: 89 MG/DL (ref <200)
HDL Cholesterol: 30 MG/DL — ABNORMAL LOW (ref 40–60)
HDL: 30 MG/DL — ABNORMAL LOW (ref 40–60)
LDL Calculated: 43.4 MG/DL (ref 0–100)
LDL, calculated: 43.4 MG/DL (ref 0–100)
Triglyceride: 78 MG/DL (ref <150)
Triglycerides: 78 MG/DL (ref <150)
VLDL Cholesterol Calculated: 15.6 MG/DL
VLDL, calculated: 15.6 MG/DL

## 2021-04-04 LAB — POCT GLUCOSE
POC Glucose: 90 mg/dL (ref 70–110)
POC Glucose: 94 mg/dL (ref 70–110)
POC Glucose: 94 mg/dL (ref 70–110)

## 2021-04-04 LAB — LACTIC ACID
Lactic Acid: 0.9 MMOL/L (ref 0.4–2.0)
Lactic acid: 0.9 MMOL/L (ref 0.4–2.0)

## 2021-04-04 LAB — HEMOGLOBIN A1C W/EAG
Hemoglobin A1C: 6 % — ABNORMAL HIGH (ref 4.2–5.6)
eAG: 126 mg/dL

## 2021-04-04 LAB — CULTURE, URINE
Culture result:: NO GROWTH
Culture: NO GROWTH

## 2021-04-04 LAB — VANCOMYCIN LEVEL, RANDOM: Vancomycin Rm: 24.3 UG/ML (ref 5.0–40.0)

## 2021-04-04 LAB — HEMOGLOBIN A1C WITH EAG
Est. average glucose: 126 mg/dL
Hemoglobin A1c: 6 % — ABNORMAL HIGH (ref 4.2–5.6)

## 2021-04-04 LAB — METABOLIC PANEL, BASIC
Anion gap: 11 mmol/L (ref 3.0–18)
BUN/Creatinine ratio: 10 — ABNORMAL LOW (ref 12–20)
BUN: 98 MG/DL — ABNORMAL HIGH (ref 7.0–18)
CO2: 19 mmol/L — ABNORMAL LOW (ref 21–32)
Calcium: 8.4 MG/DL — ABNORMAL LOW (ref 8.5–10.1)
Chloride: 116 mmol/L — ABNORMAL HIGH (ref 100–111)
Creatinine: 9.42 MG/DL — ABNORMAL HIGH (ref 0.6–1.3)
GFR est AA: 7 mL/min/{1.73_m2} — ABNORMAL LOW (ref >60)
GFR est non-AA: 6 mL/min/{1.73_m2} — ABNORMAL LOW (ref >60)
Glucose: 125 mg/dL — ABNORMAL HIGH (ref 74–99)
Potassium: 5.2 mmol/L (ref 3.5–5.5)
Sodium: 146 mmol/L — ABNORMAL HIGH (ref 136–145)

## 2021-04-04 LAB — CBC W/O DIFF
ABSOLUTE NRBC: 0 10*3/uL (ref 0.00–0.01)
HCT: 29.1 % — ABNORMAL LOW (ref 36.0–48.0)
HGB: 9.7 g/dL — ABNORMAL LOW (ref 13.0–16.0)
MCH: 31.9 PG (ref 24.0–34.0)
MCHC: 33.3 g/dL (ref 31.0–37.0)
MCV: 95.7 FL (ref 78.0–100.0)
MPV: 9.4 FL (ref 9.2–11.8)
NRBC: 0 PER 100 WBC
PLATELET: 548 10*3/uL — ABNORMAL HIGH (ref 135–420)
RBC: 3.04 M/uL — ABNORMAL LOW (ref 4.35–5.65)
RDW: 12.4 % (ref 11.6–14.5)
WBC: 25.7 10*3/uL — ABNORMAL HIGH (ref 4.6–13.2)

## 2021-04-04 LAB — GLUCOSE, POC
Glucose (POC): 90 mg/dL (ref 70–110)
Glucose (POC): 94 mg/dL (ref 70–110)
Glucose (POC): 94 mg/dL (ref 70–110)

## 2021-04-04 LAB — VANCOMYCIN, RANDOM: Vancomycin, random: 24.3 ug/mL (ref 5.0–40.0)

## 2021-04-04 MED ORDER — ONDANSETRON (PF) 4 MG/2 ML INJECTION
4 mg/2 mL | INTRAMUSCULAR | Status: AC
Start: 2021-04-04 — End: ?

## 2021-04-04 MED ORDER — CEFEPIME 1 GRAM SOLUTION FOR INJECTION
1 gram | INTRAMUSCULAR | Status: DC
Start: 2021-04-04 — End: 2021-04-07
  Administered 2021-04-04 – 2021-04-07 (×4): via INTRAVENOUS

## 2021-04-04 MED ORDER — IODIXANOL 320 MG/ML IV SOLN
320 mg iodine/mL | INTRAVENOUS | Status: DC | PRN
Start: 2021-04-04 — End: 2021-04-03
  Administered 2021-04-04

## 2021-04-04 MED FILL — ESCITALOPRAM 10 MG TAB: 10 mg | ORAL | Qty: 1

## 2021-04-04 MED FILL — LEVETIRACETAM 500 MG/5 ML IV SOLN: 500 mg/5 mL | INTRAVENOUS | Qty: 5

## 2021-04-04 MED FILL — METOPROLOL TARTRATE 50 MG TAB: 50 mg | ORAL | Qty: 1

## 2021-04-04 MED FILL — DEXTROSE 5% IN WATER (D5W) IV: INTRAVENOUS | Qty: 1000

## 2021-04-04 MED FILL — PANTOPRAZOLE 40 MG TAB, DELAYED RELEASE: 40 mg | ORAL | Qty: 1

## 2021-04-04 MED FILL — CEFEPIME 1 GRAM SOLUTION FOR INJECTION: 1 gram | INTRAMUSCULAR | Qty: 1

## 2021-04-04 MED FILL — HYDRALAZINE 50 MG TAB: 50 mg | ORAL | Qty: 1

## 2021-04-04 MED FILL — ROSUVASTATIN 10 MG TAB: 10 mg | ORAL | Qty: 1

## 2021-04-04 MED FILL — VANCOMYCIN 10 GRAM IV SOLR: 10 gram | INTRAVENOUS | Qty: 1500

## 2021-04-04 MED FILL — CYANOCOBALAMIN (VITAMIN B-12) 2,500 MCG SUBLINGUAL TAB: 2500 mcg | SUBLINGUAL | Qty: 1

## 2021-04-04 MED FILL — ONDANSETRON (PF) 4 MG/2 ML INJECTION: 4 mg/2 mL | INTRAMUSCULAR | Qty: 2

## 2021-04-04 MED FILL — THIAMINE HCL 100 MG TAB: 100 mg | ORAL | Qty: 1

## 2021-04-04 NOTE — Progress Notes (Signed)
Problem: Dysphagia (Adult)  Goal: *Acute Goals and Plan of Care (Insert Text)  Description: Patient will:  1. Tolerate PO trials with 0 s/s overt distress in 4/5 trials  2. Utilize compensatory swallow strategies/maneuvers (decrease bite/sip, size/rate, alt. liq/sol) with min cues in 4/5 trials  3. Perform oral-motor/laryngeal exercises to increase oropharyngeal swallow function with min cues  4. Complete an objective swallow study (i.e., MBSS) to assess swallow integrity, r/o aspiration, and determine of safest LRD, min A  5. Participate in Speech/language eval to maximize functional communication in current setting    Rec:     Puree with thin liquids   Aspiration precautions  HOB >45 during po intake, remain >30 for 30-45 minutes after po   Small bites/sips; alternate liquid/solid with slow feeding rate   Oral care TID  Meds crushed    Outcome: Progressing Towards Goal     SPEECH LANGUAGE PATHOLOGY BEDSIDE SWALLOW   EVALUATION & TREATMENT     Patient: Thomas Jackson (50 y.o. male)  Date: 04/04/2021  Primary Diagnosis: Obstructive uropathy [N13.9]  Procedure(s) (LRB):  CYSTOSCOPY WITH LEFT RETROGRADES AND LEFT DOUBLE J STENT PLACEMENT/ C-ARM (Left) 1 Day Post-Op   Precautions: Aspiration; Aphasic     PLOF: Per H&P    ASSESSMENT :  Based on the objective data described below, the patient presents with moderate oral and mild pharyngeal dysphagia.  Chart Review and clearance obtained by RN. Today, SLP conducted a Clinical Beside Swallow Evaluation, per MD orders. Pt A&Ox self . He suffered a recent Intracerebral hemorrhage in 02/2021 and his speech is aphasic and dysarthric.   Pt observed with thin liquids +/- straw via single sips and successive swallows with mildly delayed swallow initiation, adequate laryngeal elevation to palpation and no overt s/sx aspiration.  Pt demo'd acceptance of puree with with slow oral prep, but complete clearance. Pt demo'd oral holding and unable to masticate advanced solids. Pt relied  on SLP to fully clear oral cavity of oral residuals.     SLP RECS: Initiate Puree  diet with thin liquids, meds crushed  with oral care TID and standard aspiration precautions.    *He would further benefit from a Speech/language evaluation to maximize functional communication in current setting when more alert*      ST will continue to follow.    Pt education provided and whiteboard updated.  D/w RN, Dorinda Hill    TREATMENT :  Skilled therapy initiated; Educated pt on aspiration precautions and importance of compensatory swallow techniques to decrease aspiration risk (decrease rate of intake & sip/bite size, upright @HOB  for all po intake and ~30 minutes after po); verbalized comprehension. SLP to follow as indicated.     Patient will benefit from skilled intervention to address the above impairments.  Patient's rehabilitation potential is considered to be Fair  Factors which may influence rehabilitation potential include:   []             None noted  [x]             Mental ability/status  [x]             Medical condition  []             Home/family situation and support systems  []             Safety awareness  []             Pain tolerance/management  []             Other:  PLAN :  Recommendations and Planned Interventions:  See above  Frequency/Duration: Patient will be followed by speech-language pathology 1-2 times per day/3-5 days per week to address goals.  Discharge Recommendations: Rehab     SUBJECTIVE:   Patient's speech was very garbled and aphasic    OBJECTIVE:     Past Medical History:   Diagnosis Date    CKD (chronic kidney disease) stage 4, GFR 15-29 ml/min (HCC) 04/03/2021    Depression 04/03/2021    Essential hypertension 04/03/2021    GERD without esophagitis 04/03/2021    Intracerebral hemorrhage (HCC) 02/24/2021    Intracerebral hemorrhage, nontraumatic (HCC)     Metabolic encephalopathy 04/03/2021    Mixed hyperlipidemia 04/03/2021    Obesity 04/03/2021    Seizure disorder (HCC) 04/03/2021    Type 2 diabetes  mellitus (HCC) 04/03/2021    Vitamin D deficiency 04/03/2021   History reviewed. No pertinent surgical history.  Home Situation:   Home Situation  Home Environment: Rehabilitation facility  # Steps to Enter: 0  One/Two Story Residence: Two story, live on 1st floor  # of Interior Steps: 0  Height of Each Step (in): 0 inches  Interior Rails: None  Retail buyer Available: No  Living Alone: No  Support Systems: Spouse/Significant Other  Patient Expects to be Discharged to:: Skilled nursing facility  Current DME Used/Available at Home: None      Cognitive and Communication Status:  Neurologic State: Eyes open to stimulus  Orientation Level: Oriented to person  Cognition: Follows commands  Perception: Appears intact     Safety/Judgement: Lack of insight into deficits  Oral Assessment:  Oral Assessment  Labial: Decreased seal  Dentition: Natural  Oral Hygiene: fair  Lingual: Incoordinated;Decreased strength;Decreased rate  Velum: Unable to visualize  Mandible: No impairment  Gag Reflex: No impairment  P.O. Trials:  Patient Position: 70  Vocal quality prior to P.O.: Low volume  Consistency Presented: Pudding;Solid;Thin liquid  How Presented: SLP-fed/presented     Bolus Acceptance: No impairment  Bolus Formation/Control: Impaired  Type of Impairment: Lip closure;Delayed;Incomplete;Mastication  Propulsion: Delayed (# of seconds)  Oral Residue: 10-50% of bolus  Initiation of Swallow: Delayed (# of seconds)  Laryngeal Elevation: Functional  Aspiration Signs/Symptoms: None  Pharyngeal Phase Characteristics: Easily fatigued   Effective Modifications: Alternate liquids/solids;Small sips and bites;Other (comment)  Cues for Modifications: Moderate       Oral Phase Severity: Moderate  Pharyngeal Phase Severity : Mild    PAIN:  Pain level pre-treatment: 0/10   Pain level post-treatment: 0/10   Pain Intervention(s): Medication (see MAR); Rest, Ice, Repositioning   Response to intervention: Nurse notified, See doc flow    After treatment:    []             Patient left in no apparent distress sitting up in chair  [x]             Patient left in no apparent distress in bed  [x]             Call bell left within reach  [x]             Nursing notified  []             Family present  []             Caregiver present  []             Bed alarm activated    COMMUNICATION/EDUCATION:   [x]   Aspiration precautions; swallow safety; compensatory techniques.  [x]             Patient/family have participated as able in goal setting and plan of care.  [x]             Patient/family agree to work toward stated goals and plan of care.  []             Patient understands intent and goals of therapy; neutral about participation.  []             Patient unable to participate in goal setting/plan of care; educ ongoing with interdisciplinary staff  [x]          Posted safety precautions in patient's room.    Thank you for this referral.  , SLP  MA, CCC-SLP  Speech-Language Pathologist    Time Calculation: 23 mins  Evaluation Time: 10 minutes   Treatment Time: 13 minutes

## 2021-04-04 NOTE — Progress Notes (Signed)
 Problem: Mobility Impaired (Adult and Pediatric)  Goal: *Acute Goals and Plan of Care (Insert Text)  Description: Physical Therapy Goals  Initiated 04/04/2021 and to be accomplished within 7 day(s)  1.  Patient will move from supine to sit and sit to supine  in bed with supervision/set-up.    2.  Patient will transfer from bed to chair and chair to bed with supervision/set-up using the least restrictive device.  3.  Patient will perform sit to stand with supervision/set-up.  4.  Patient will ambulate with supervision/set-up for 50 feet with the least restrictive device.     PLOF: Unable to provide information d/t aphasis. Per chart, admitted from facility; unclear if short stay rehab or long term care.    Outcome: Progressing Towards Goal   PHYSICAL THERAPY EVALUATION    Patient: Thomas Jackson (50 y.o. male)  Date: 04/04/2021  Primary Diagnosis: Obstructive uropathy [N13.9]  Procedure(s) (LRB):  CYSTOSCOPY WITH LEFT RETROGRADES AND LEFT DOUBLE J STENT PLACEMENT/ C-ARM (Left) 1 Day Post-Op   Precautions: Fall  ASSESSMENT :  Evaluated in ICU. Aphasia limits communication. Mod A for supine to sit. Seated EOB with cues for midline balance. Min A for sit to stand and transfer to bedside commode. Mod A for stand to sit. Cues for safety. Min A for standing balance for clean-up post toileting. Returned to seated EOB. Poor safety awareness and motor planning for sit to supine. Seated in bed with HOB elevated. Call bell in reach.     Patient will benefit from skilled intervention to address the above impairments.  Patient's rehabilitation potential is considered to be Fair  Factors which may influence rehabilitation potential include:   []          None noted  []          Mental ability/status  [x]          Medical condition  []          Home/family situation and support systems  []          Safety awareness  []          Pain tolerance/management  []          Other:      PLAN :  Recommendations and Planned Interventions:   [x]             Bed Mobility Training             [x]     Neuromuscular Re-Education  [x]            Transfer Training                   []     Orthotic/Prosthetic Training  [x]            Gait Training                          []     Modalities  [x]            Therapeutic Exercises           []     Edema Management/Control  [x]            Therapeutic Activities            [x]     Family Training/Education  [x]            Patient Education  []            Other (comment):    Frequency/Duration: Patient will be followed by  physical therapy 3-5 times a week to address goals.    Further Equipment Recommendations for Discharge: rolling walker    AMPAC Basic Mobility Inpatient Short Form: 13/20, with stairs omitted  This AMPAC score should be considered in conjunction with interdisciplinary team recommendations to determine the most appropriate discharge setting. Patient's social support, diagnosis, medical stability, and prior level of function should also be taken into consideration.     Based on an AM-PAC score of 13/20 if omitting stairs and current functional mobility deficits, it is recommended that the patient have 3-5 sessions per week of physical therapy at d/c to increase the patient's independence.      SUBJECTIVE:   Patient stated "Sorry."    OBJECTIVE DATA SUMMARY:     Past Medical History:   Diagnosis Date    CKD (chronic kidney disease) stage 4, GFR 15-29 ml/min (HCC) 04/03/2021    Depression 04/03/2021    Essential hypertension 04/03/2021    GERD without esophagitis 04/03/2021    Intracerebral hemorrhage (HCC) 02/24/2021    Intracerebral hemorrhage, nontraumatic (HCC)     Metabolic encephalopathy 04/03/2021    Mixed hyperlipidemia 04/03/2021    Obesity 04/03/2021    Seizure disorder (HCC) 04/03/2021    Type 2 diabetes mellitus (HCC) 04/03/2021    Vitamin D deficiency 04/03/2021   History reviewed. No pertinent surgical history.  Barriers to Learning/Limitations: yes;  cognitive and sensory deficits-vision/hearing/speech  Compensate with:  Visual Cues, Verbal Cues, Tactile Cues and Kinesthetic Cues    Home Situation:  Home Situation  Home Environment: Rehabilitation facility  # Steps to Enter: 0  One/Two Story Residence: Two story, live on 1st floor  # of Interior Steps: 0  Height of Each Step (in): 0 inches  Interior Rails: None  Lift Chair Available: No  Living Alone: No  Support Systems: Spouse/Significant Other  Patient Expects to be Discharged to:: Skilled nursing facility  Current DME Used/Available at Home: None    Critical Behavior:  Neurologic State: Alert  Orientation Level: Oriented to person    Strength:    Manual Muscle Testing (LE)         R     L    Hip Flexion:   4/5  4/5  Knee EXT:   4/5  4/5  Knee FLEX:   4/5  4/5  Ankle DF:   4/5  4/5  _________________________________________________   Range Of Motion:  BLE AROM WFL  Functional Mobility:  Bed Mobility:  Supine to Sit: Moderate assistance  Sit to Supine: Moderate assistance  Scooting: Maximum assistance;Assist x2  Transfers:  Sit to Stand: Minimum assistance  Stand to Sit: Moderate assistance  Balance:   Sitting: Impaired  Sitting - Static: Fair (occasional)  Sitting - Dynamic: Fair (occasional)  Standing: Impaired;With support  Standing - Static: Good  Standing - Dynamic : Fair  Therapeutic Exercises:   Sit to stand x2; transfer to/from bedside commode  Standing balance 3 minutes  Pain:  Pain level pre-treatment: 0/10   Pain level post-treatment: 0/10     Activity Tolerance:   Fair    After treatment:   []          Patient left in no apparent distress sitting up in chair  [x]          Patient left in no apparent distress in bed  [x]          Call bell left within reach  [x]          Nursing notified  []   Caregiver present  []          Bed alarm activated  []          SCDs applied    COMMUNICATION/EDUCATION:   [x]          Role of physical therapy and plan of care in the acute care setting.  [x]          Fall prevention education was provided and the patient/caregiver indicated  understanding.  [x]          Patient/family have participated as able in goal setting and plan of care.  []          Patient/family agree to work toward stated goals and plan of care.  []          Patient understands intent and goals of therapy, but is neutral about his/her participation.  []          Patient is unable to participate in goal setting/plan of care: ongoing with therapy staff.    Thank you for this referral.  Edsel LOISE Loll, PT   Time Calculation: 30 mins    Eval Complexity: History: MEDIUM  Complexity : 1-2 comorbidities / personal factors will impact the outcome/ POC Exam:MEDIUM Complexity : 3 Standardized tests and measures addressing body structure, function, activity limitation and / or participation in recreation  Presentation: MEDIUM Complexity : Evolving with changing characteristics  Clinical Decision Making:Medium Complexity    Clinical judgement; ROM, MMT, functional mobility Overall Complexity:MEDIUM    [facility] AM-PAC Basic Mobility Inpatient Short Form (6-Clicks) Version 2    How much HELP from another person does the patient currently need.    (If the patient hasn't done an activity recently, how much help from another person do you think he/she would need if he/she tried?)   Total (Total A or Dep)   A Lot  (Mod to Max A)   A Little (Sup or Min A)   None (Mod I to I)   Turning from your back to your side while in a flat bed without using bedrails?   []  1 [x]  2 []  3 []  4   2. Moving from lying on your back to sitting on the side of a flat bed without using bedrails?    []  1 [x]  2 []  3 []  4   3. Moving to and from a bed to a chair (including a wheelchair)?   []  1 []  2 [x]  3 []  4   4. Standing up from a chair using your arms (e.g., wheelchair, or bedside chair)?   []  1 []  2 [x]  3 []  4   5. Walking in hospital room?   []  1 []  2 [x]  3 []  4   6. Climbing 3-5 steps with a railing?+  Not tested []  1 []  2 []  3 []  4   +If stair climbing cannot be assessed, skip item #6.  Sum responses from  items 1-5.

## 2021-04-04 NOTE — Consults (Signed)
Consults by  Thomas CockerBichu, Kyeisha Janowicz B, MD at 04/04/21 1652                Author: Georgiann CockerBichu, Tayonna Bacha B, MD  Service: Nephrology  Author Type: Physician       Filed: 04/04/21 1654  Date of Service: 04/04/21 1652  Status: Signed          Editor: Thomas CockerBichu, Gazella Anglin B, MD (Physician)                             Consult Note            Consult requested by: Henrietta HooverHall, Brittney N, DO      ADMIT DATE: 04/03/2021     CONSULT DATE: April 04, 2021             Admission diagnosis: altered mental status   Reason for Nephrology Consultation: AKI on CKD4      Assessment and plan   1) AKI on CKD 4 ( baseline 3-4 creatinine )--> suspect ATN ,    may have some obst component    2) Concern for severe Septis ,Urosepsis    3) S/p cysto with stent placed   4) Left mild hydro with UPJ stone ,Urology following    5) Metabolic acidoses improving    6) Hypernatremia better   7) Altered mental status    8) h/o intracranial hemorrhage        Plan:   1) strict intake/output    2) Continue hypotonic bicarb gtt , switch to LR when bicarb > 22   Monitor renal parameters electrolytes q6hrs    3) avoid IV contrast , nephrotoxins   4) renally dose AB    5) no acute indication for RRT      discussed with nursing/primary team       Please call with questions,       Robyne AskewPrasad Zafira Munos, MD FASN   Cell [phone]   Pager: [phone]          HPI: Thomas Mullingatrick Dauenhauer is a 50 y.o. male WHITE/NON-HISPANIC  with past medical history of intracranial hemorrhage, chronic kidney disease stage IV with baseline creatinine between 3-4 range, hypertension, GERD, history of seizure disorder, diabetes mellitus  type 2 [facility], was found to be unresponsive in the nursing home and sent to the emergency room.  In the ED he had a negative CT, positive urine analysis for infection, CT abdomen pelvis with left ureteric stone with mild hydronephrosis  with elevated white count.  Was started on ceftriaxone, creatinine was up to 9 range and patient had hyperkalemia and metabolic  acidosis.  Patient went to the OR and scheduled with emergent cystogram and stent was placed.  Patient was then admitted for  possible urosepsis and severe AKI         Past Medical History:        Diagnosis  Date         ?  CKD (chronic kidney disease) stage 4, GFR 15-29 ml/min (HCC)  04/03/2021     ?  Depression  04/03/2021     ?  Essential hypertension  04/03/2021     ?  GERD without esophagitis  04/03/2021     ?  Intracerebral hemorrhage (HCC)  02/24/2021     ?  Intracerebral hemorrhage, nontraumatic (HCC)       ?  Metabolic encephalopathy  04/03/2021     ?  Mixed  hyperlipidemia  04/03/2021     ?  Obesity  04/03/2021     ?  Seizure disorder (HCC)  04/03/2021     ?  Type 2 diabetes mellitus (HCC)  04/03/2021         ?  Vitamin D deficiency  04/03/2021         History reviewed. No pertinent surgical history.       Social History          Socioeconomic History         ?  Marital status:  SINGLE              Spouse name:  Not on file         ?  Number of children:  Not on file     ?  Years of education:  Not on file     ?  Highest education level:  Not on file       Occupational History        ?  Not on file       Tobacco Use         ?  Smoking status:  Not on file     ?  Smokeless tobacco:  Not on file       Substance and Sexual Activity         ?  Alcohol use:  Not on file     ?  Drug use:  Not on file     ?  Sexual activity:  Not on file        Other Topics  Concern        ?  Not on file       Social History Narrative        ?  Not on file          Social Determinants of Health          Financial Resource Strain: Not on file     Food Insecurity: Not on file     Transportation Needs: Not on file     Physical Activity: Not on file     Stress: Not on file     Social Connections: Not on file     Intimate Partner Violence: Not on file       Housing Stability: Not on file          History reviewed. No pertinent family history.     Allergies        Allergen  Reactions         ?  Amoxicillin  Other (comments)         ?  Penicillin G   Unknown (comments)              Home Medications:          Medications Prior to Admission        Medication  Sig         ?  hydrALAZINE (APRESOLINE) 50 mg tablet  Take 50 mg by mouth three (3) times daily.     ?  amLODIPine (NORVASC) 10 mg tablet  Take 1 Tablet by mouth in the morning.     ?  cyanocobalamin (VITAMIN B12) 2,500 mcg sublingual tablet  Take 1 Tablet by mouth in the morning.     ?  ergocalciferol (ERGOCALCIFEROL) 1,250 mcg (50,000 unit) capsule  Take 1 Capsule by mouth every seven (7) days.     ?  escitalopram oxalate (LEXAPRO)  5 mg tablet  Take 1 Tablet by mouth every evening.     ?  levETIRAcetam (KEPPRA) 500 mg tablet  Take 1 Tablet by mouth two (2) times a day.     ?  metoprolol tartrate (LOPRESSOR) 50 mg tablet  Take 1 Tablet by mouth two (2) times a day.     ?  pantoprazole (PROTONIX) 40 mg tablet  Take 1 Tablet by mouth Daily (before breakfast).     ?  rosuvastatin (CRESTOR) 10 mg tablet  Take 1 Tablet by mouth nightly.         ?  thiamine HCL (B-1) 100 mg tablet  Take 1 Tablet by mouth in the morning.             Current Inpatient Medications:          Current Facility-Administered Medications          Medication  Dose  Route  Frequency           ?  insulin lispro (HUMALOG) injection     SubCUTAneous  AC&HS     ?  glucose chewable tablet 16 g   4 Tablet  Oral  PRN     ?  glucagon (GLUCAGEN) injection 1 mg   1 mg  IntraMUSCular  PRN     ?  dextrose 10% infusion 0-250 mL   0-250 mL  IntraVENous  PRN     ?  [Held by provider] amLODIPine (NORVASC) tablet 10 mg   10 mg  Oral  DAILY     ?  pantoprazole (PROTONIX) tablet 40 mg   40 mg  Oral  ACB     ?  rosuvastatin (CRESTOR) tablet 10 mg   10 mg  Oral  QHS     ?  sodium chloride (NS) flush 5-40 mL   5-40 mL  IntraVENous  Q8H           ?  sodium chloride (NS) flush 5-40 mL   5-40 mL  IntraVENous  PRN           ?  acetaminophen (TYLENOL) tablet 650 mg   650 mg  Oral  Q6H PRN          Or           ?  acetaminophen (TYLENOL) suppository 650 mg   650 mg   Rectal  Q6H PRN     ?  polyethylene glycol (MIRALAX) packet 17 g   17 g  Oral  DAILY PRN     ?  ondansetron (ZOFRAN ODT) tablet 4 mg   4 mg  Oral  Q8H PRN          Or           ?  ondansetron (ZOFRAN) injection 4 mg   4 mg  IntraVENous  Q6H PRN     ?  metoprolol tartrate (LOPRESSOR) tablet 50 mg   50 mg  Oral  BID     ?  levETIRAcetam (KEPPRA) 500 mg in 0.9% sodium chloride (MBP/ADV) 100 mL MBP   500 mg  IntraVENous  Q12H     ?  hydrALAZINE (APRESOLINE) tablet 50 mg   50 mg  Oral  TID     ?  escitalopram oxalate (LEXAPRO) tablet 5 mg   5 mg  Oral  QPM     ?  thiamine HCL (B-1) tablet 100 mg   100 mg  Oral  DAILY     ?  cyanocobalamin (VITAMIN B12) sublingual tablet 2,500 mcg   2,500 mcg  Oral  DAILY     ?  Vancomycin: Pharmacy To Dose     Other  Rx Dosing/Monitoring           ?  cefepime (MAXIPIME) 1 g in 0.9% sodium chloride (MBP/ADV) 50 mL MBP   1 g  IntraVENous  Q24H             Review of Systems:     Unable to obtain            Physical Assessment:          Vitals:             04/04/21 0845  04/04/21 0900  04/04/21 1100  04/04/21 1200           BP:    (!) 165/73  (!) 168/79  (!) 130/59     Pulse:  92  89  89  75     Resp:  17  14  15  15      Temp:        98.2 ??F (36.8 ??C)     SpO2:  94%  97%  99%  93%     Weight:                   Height:                  Last 3 Recorded Weights in this Encounter            04/03/21 1315  04/03/21 2020  04/04/21 0000          Weight:  114.6 kg (252 lb 10.4 oz)  109 kg (240 lb 4.8 oz)  109 kg (240 lb 4.8 oz)        Admission weight: Weight: 114.6 kg (252 lb 10.4 oz) (04/03/21 1315)         Intake/Output Summary (Last 24 hours) at 04/04/2021 1652   Last data filed at 04/04/2021 1542     Gross per 24 hour        Intake  1568.33 ml        Output  2751 ml        Net  -1182.67 ml           Patient is in no apparent distress.    HEENT: mmm   Neck: no cervical lymphadenopathy or thyromegaly.    Lungs: good air entry, clear to auscultation bilaterally.    Cardiovascular system: S1, S2,  regular rate and rhythm.    Abdomen: soft, non tender, non distended.     Extremities: no clubbing, cyanosis or edema.    Integumentary: skin is grossly intact.          Data Review:         Labs:  Results:                  Chemistry  Recent Labs         04/04/21   0140  04/03/21   1559      GLU  125*  97      NA  146*  146*      K  5.2  4.7      CL  116*  117*      CO2  19*  17*      BUN  98*  104*      CREA  9.42*  9.73*  CA  8.4*  9.1   9.2      AGAP  11  12      BUCR  10*  11*      AP   --   85      TP   --   6.7      ALB   --   2.3*      GLOB   --   4.4*      AGRAT   --   0.5*                       CBC w/Diff  Recent Labs         04/04/21   0140  04/03/21   1559      WBC  25.7*  24.5*      RBC  3.04*  3.30*      HGB  9.7*  10.3*      HCT  29.1*  30.5*      PLT  548*  553*      GRANS   --   91*      LYMPH   --   4*      EOS   --   1                       Iron/Ferritin  Recent Labs         04/03/21   1559      IRON  59                 PTH/VIT D  No results for input(s): PTH in the last 72 hours.      No lab exists for component: VITD                     Thomas Cocker, MD   04/04/2021   4:52 PM         April 04, 2021

## 2021-04-04 NOTE — Progress Notes (Signed)
Speech Pathology Note:    ST eval completed. Rec initiation of puree diet with thin liquids, meds crushed, standard aspiration precautions, and oral care TID. ST will continue to follow. D/W RN.     Thank You for this referral,  Manning Charity, MA CCC-SLP  Speech Language Pathologist

## 2021-04-04 NOTE — Progress Notes (Signed)
Progress  Notes by Collene MaresSappal, Ansel Ferrall, MD at 04/04/21 1047                Author: Collene MaresSappal, Abishai Viegas, MD  Service: Urology  Author Type: Physician       Filed: 04/04/21 1331  Date of Service: 04/04/21 1047  Status: Addendum          Editor: Collene MaresSappal, Kavonte Bearse, MD (Physician)          Related Notes: Original Note by Sharlyne Pacasauer, Dana, PA (Physician Assistant) filed at 04/04/21 1219                          Urology Progress Note              Assessment/Plan:          Patient Active Problem List        Diagnosis  Code         ?  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  Obstructive uropathy  N13.9     ?  CKD (chronic kidney disease) stage 4, GFR 15-29 ml/min (HCC)  N18.4     ?  Intracerebral hemorrhage (HCC)  I61.9     ?  Seizure disorder (HCC)  G40.909     ?  Type 2 diabetes mellitus (HCC)  E11.9     ?  Metabolic encephalopathy  G93.41     ?  Depression  F32.A     ?  Essential hypertension  I10     ?  Mixed hyperlipidemia  E78.2     ?  GERD without esophagitis  K21.9     ?  Vitamin D deficiency  E55.9     ?  Obesity  E66.9     ?  Anemia  D64.9     ?  UTI (urinary tract infection)  N39.0     ?  AKI (acute kidney injury) (HCC)  N17.9         ?  Sepsis (HCC)  A41.9           ASSESSMENT:    Mild Left Hydronephrosis 2/2 11mm Left UPJ Stone [facility]   UTI   AKI   S/p Cysto, L RGP, L JJ stent placement on 04/03/21 by Dr. Sinclair GroomsGiven    Findings: Pus in Kidney               WBC 25.7>24.5   Creat: 9.42<9.73               Baseline 3.6-4.6             UA 04/02/21: Positive Nitrite, WBC 20-50, RBC 1-2, many bacteria   UCX and BCX pending               PT 13.3, INR 1.1 on 04/02/21       H/o Recent CVA/Hemorrhagic Stroke       PLAN:     Appreciate overall management per medicine.    Maintain foley until clinically improved.   Continue ABX- Cefepime and Vanc   Patient will need definitive stone surgery outpatient.   Will see Monday.      Follow up arranged? Yes, intermediate pathway.           Sharlyne Pacasana Dauer PA-C   [facility]    Available M-Fri, 8AM- 5PM   Pager: [phone]          Subjective:  Daily Progress Note: 04/04/2021 10:47 AM      Thomas Jackson is sleeping. He is s/p stent with tmax 99.7.        Objective:        Visit Vitals      BP  (!) 125/58 (BP 1 Location: Left upper arm, BP Patient Position: At rest)     Pulse  92     Temp  98.5 ??F (36.9 ??C)     Resp  17     Ht  5\' 3"  (1.6 m)     Wt  109 kg (240 lb 4.8 oz)     SpO2  94%        BMI  42.57 kg/m??            Temp (24hrs), Avg:98.7 ??F (37.1 ??C), Min:98.2 ??F (36.8 ??C), Max:99.7 ??F (37.6 ??C)         Intake and Output:   08/24 1901 - 08/26 0700   In: 1568.3 [I.V.:1568.3]   Out: 1300 [Urine:1300]   No intake/output data recorded.      PHYSICAL EXAMINATION:    Visit Vitals      BP  (!) 125/58 (BP 1 Location: Left upper arm, BP Patient Position: At rest)     Pulse  92     Temp  98.5 ??F (36.9 ??C)     Resp  17     Ht  5\' 3"  (1.6 m)     Wt  109 kg (240 lb 4.8 oz)     SpO2  94%        BMI  42.57 kg/m??        Constitutional: Well developed, well nourished male.  No acute distress.     HEENT: Normocephalic, Atraumatic, EOM's intact    CV:  no edema   Respiratory: No respiratory distress or difficulties breathing    Abdomen:  soft and non tender    GU Male:  No CVA tenderness   SCROTUM:  No scrotal rash or lesions noticed.  Normal bilateral testes and epididymis.    PENIS: Urethral meatus buried. Foley in place with dark yellow, cloudy urine.   Skin: No evidence of jaundice.  Normal color   Neuro/Psych:  sleepy      Lab/Data Review:   All lab results for the last 24 hours reviewed.      Labs:          Labs:  Results:        Chemistry      Recent Labs         04/04/21   0140  04/03/21   1559      GLU  125*  97      NA  146*  146*      K  5.2  4.7      CL  116*  117*      CO2  19*  17*      BUN  98*  104*      CREA  9.42*  9.73*      CA  8.4*  9.1   9.2      AGAP  11  12      BUCR  10*  11*      AP   --   85      TP   --   6.7      ALB   --   2.3*      GLOB   --  4.4*      AGRAT   --    0.5*                 CBC w/Diff  Recent Labs         04/04/21   0140  04/03/21   1559      WBC  25.7*  24.5*      RBC  3.04*  3.30*      HGB  9.7*  10.3*      HCT  29.1*  30.5*      PLT  548*  553*      GRANS   --   91*      LYMPH   --   4*      EOS   --   1              Cultures  Recent Labs         04/03/21   2256      CULT  NO GROWTH AFTER 7 HOURS         All Micro Results            Procedure  Component  Value  Units  Date/Time        CULTURE, BLOOD [[id number]]  Collected: 04/03/21 2256        Order Status: Completed  Specimen: Blood  Updated: 04/04/21 0626          Special Requests:  NO SPECIAL REQUESTS               Culture result:  NO GROWTH AFTER 7 HOURS             CULTURE, URINE [[id number]]  Collected: 04/03/21 1958        Order Status: No result  Updated: 04/04/21 0152        CULTURE, URINE [[id number]]  Collected: 04/03/21 1638        Order Status: Sent  Specimen: Cath Urine  Updated: 04/04/21 0152        CULTURE, MRSA [[id number]]  Collected: 04/03/21 1638        Order Status: Sent  Specimen: Nasal from Nares  Updated: 04/04/21 0110        COVID-19 RAPID TEST [[id number]]  Collected: 04/03/21 1845        Order Status: Completed  Specimen: Nasopharyngeal  Updated: 04/03/21 1917          Specimen source  Nasopharyngeal               COVID-19 rapid test  Not detected               Comment:  Rapid Abbott ID Now         Rapid NAAT:  The specimen is NEGATIVE for SARS-CoV-2, the novel coronavirus associated with COVID-19.         Negative results should be treated as presumptive and, if inconsistent with clinical signs and symptoms or necessary for patient management, should be tested with an alternative molecular assay.   Negative results do not preclude SARS-CoV-2 infection and should not be used as the sole basis for patient management decisions.         This test has been authorized by the FDA under an Emergency Use Authorization (EUA) for use by authorized laboratories.    Fact sheet for Healthcare  Providers: FlickSafe.gl   Fact sheet for Patients: [place].at         Methodology: Isothermal Nucleic Acid Amplification  Urinalysis  Color      Date  Value  Ref Range  Status      04/03/2021  YELLOW     Final         Appearance      Date  Value  Ref Range  Status      04/03/2021  CLOUDY     Final         Specific gravity      Date  Value  Ref Range  Status      04/03/2021  1.011  1.005 - 1.030    Final         pH (UA)      Date  Value  Ref Range  Status      04/03/2021  5.0  5.0 - 8.0    Final         Protein      Date  Value  Ref Range  Status      04/03/2021  300 (A)  NEG mg/dL  Final         Ketone      Date  Value  Ref Range  Status      04/03/2021  Negative  NEG mg/dL  Final         Bilirubin      Date  Value  Ref Range  Status      04/03/2021  Negative  NEG    Final         Blood      Date  Value  Ref Range  Status      04/03/2021  Negative  NEG    Final         Urobilinogen      Date  Value  Ref Range  Status      04/03/2021  0.2  0.2 - 1.0 EU/dL  Final         Nitrites      Date  Value  Ref Range  Status      04/03/2021  Negative  NEG    Final         Leukocyte Esterase      Date  Value  Ref Range  Status      04/03/2021  MODERATE (A)  NEG    Final         Potassium      Date  Value  Ref Range  Status      04/04/2021  5.2  3.5 - 5.5 mmol/L  Final         Creatinine      Date  Value  Ref Range  Status      04/04/2021  9.42 (H)  0.6 - 1.3 MG/DL  Final         BUN      Date  Value  Ref Range  Status      04/04/2021  98 (H)  7.0 - 18 MG/DL  Final              PSA  No results for input(s): PSA in the last 72 hours.        Coagulation  Lab Results      Component  Value  Date/Time        Prothrombin time  12.1  02/24/2021 06:18 PM        INR  1.0  02/24/2021 06:18 PM  I discussed the case and independently reviewed the pertinent labs and imaging. I agree with the findings and plan of care, as documented  in the note above.      Collene Mares, MD   [facility]

## 2021-04-04 NOTE — Progress Notes (Signed)
Progress Note  Hospitalist Service    Patient: Thomas Jackson MRN: [id number]   SSN: [id number]  Date of Birth: May 13, 1971   Age: 50 y.o.  Sex: male      Admit Date: 04/03/2021    LOS: 1 day   No chief complaint on file.      Subjective:     Patient seen in ICU on stepdown status. Okay for transfer to tele.   Not endorsing any pain but only selectively answers questions.     Objective:     Vitals:  Visit Vitals  BP (!) 130/59 (BP 1 Location: Left upper arm, BP Patient Position: At rest)   Pulse 75   Temp 98.2 ??F (36.8 ??C)   Resp 15   Ht 5\' 3"  (1.6 m)   Wt 109 kg (240 lb 4.8 oz)   SpO2 93%   BMI 42.57 kg/m??       Physical Exam:   General: NAD, appears stated age  Skin: warm, dry, no rashes  Eyes: PERRL, sclera is non-icteric  HENT: normocephalic/atraumatic, moist mucus membranes  Respiratory: CTA with no signs of respiratory distress  Cardiovascular: RRR, no m/r/g, no cyanosis or peripheral edema of extremities  GI: soft, normal bowel sounds  GU: foley in place     Intake and Output:  Current Shift: 08/26 0701 - 08/26 1900  In: 0   Out: 1451 [Urine:1450]  Last three shifts: 08/24 1901 - 08/26 0700  In: 1568.3 [I.V.:1568.3]  Out: 1300 [Urine:1300]    Lab/Data Review:  Recent Results (from the past 12 hour(s))   GLUCOSE, POC    Collection Time: 04/04/21 11:28 AM   Result Value Ref Range    Glucose (POC) 94 70 - 110 mg/dL   GLUCOSE, POC    Collection Time: 04/04/21  4:43 PM   Result Value Ref Range    Glucose (POC) 94 70 - 110 mg/dL         Key Findings or tests:       Telemetry NONE   Oxygen NONE     Assessment and Plan:   Sepsis, POA   Obstructive uropathy - now s/p cystoscopy, left retrograde pyelogram, left double J stent   Left hydronephrosis   Complicated UTI  AKI on CKD stage IV  Acute metabolic encephalopathy - due to above   H/o intracranial hemorrhage     Continue cefepime, vanco  Follow up cultures.   Maintain foley until clinical improvement, VT prior to discharge   Urology following, will see on Monday   Will  need definitive stone management OP     Diet Dysphagia    DVT Prophylax SCDs   GI Prophylaxis    Code status Full    Disposition Awaiting urine culture, can d/c after susceptibilities. Will need void trial. Likely d/c Monday         Monday, DO, hospitalist   April 04, 2021

## 2021-04-04 NOTE — Progress Notes (Signed)
 Problem: Self Care Deficits Care Plan (Adult)  Goal: *Acute Goals and Plan of Care (Insert Text)  Description: Occupational Therapy Goals  Initiated 04/04/2021 within 7 day(s).    1.  Patient will perform grooming with supervision/set-up sitting EOB with Good balance for >5 min.   2.  Patient will perform upper body dressing with supervision/set-up.  3.  Patient will perform functional activity in standing with supervision/set-up for >2 min.  4.  Patient will perform toilet transfers with supervision/set-up.  5.  Patient will perform all aspects of toileting with minimal assistance/contact guard assist.  6.  Patient will participate in upper extremity therapeutic exercise/activities with supervision/set-up for 8 minutes to improve endurance and UB strength needed for ADLs    7.  Patient will utilize energy conservation techniques during functional activities with verbal cues.    Prior Level of Function: Pt is poor historian due to aphasia. Pt indicates he receives assistance for toileting and LB ADLs, unsure of accuracy. Per chart review pt admitted from rehab.  Outcome: Progressing Towards Goal  OCCUPATIONAL THERAPY EVALUATION    Patient: Thomas Jackson (49 y.o. male)  Date: 04/04/2021  Primary Diagnosis: Obstructive uropathy [N13.9]  Procedure(s) (LRB):  CYSTOSCOPY WITH LEFT RETROGRADES AND LEFT DOUBLE J STENT PLACEMENT/ C-ARM (Left) 1 Day Post-Op   Precautions: seizure, fall       ASSESSMENT :  Pt cleared to participate in OT evaluation by RN. Upon entering room, pt received in bed, alert, and agreeable to OT eval/treatment. Pt seen in conjunction with PT to maximize safety of patient and staff members. Based on the objective data described below, the patient presents with decreased endurance and functional activity tolerance, generalized weakness, expressive and receptive aphasia, confusion, decreased functional mobility, seated, and standing balance, decreased commands following, limiting pt's participation and  independence with ADLs. Pt intermittently follows commands and benefits from visual demonstration and tasks presented in-context. Pt maneuvered to EOB requiring extended time to position at midline due to decreased processing. Pt found to be soiled with BM and reported having BM again, maneuvered to [facility] with Min A with HHA and tactile cues for hands placement. Pt required Max A for toileting hygiene. Pt returned to bed at the end of the session, all needs within reach.    Patient will benefit from skilled intervention to address the above impairments.  Patient's rehabilitation potential is considered to be Fair  Factors which may influence rehabilitation potential include:   []              None noted  [x]              Mental ability/status  [x]              Medical condition  [x]              Home/family situation and support systems  []              Safety awareness  []              Pain tolerance/management  []              Other:      PLAN :  Recommendations and Planned Interventions:   [x]                Self Care Training                  [x]       Therapeutic Activities  [x]   Functional Mobility Training   [x]       Cognitive Retraining  [x]                Therapeutic Exercises           [x]       Endurance Activities  [x]                Balance Training                    [x]       Neuromuscular Re-Education  [x]                Visual/Perceptual Training     [x]       Home Safety Training  []                Patient Education                   [x]       Family Training/Education  []                Other (comment):    Frequency/Duration: Patient will be followed by occupational therapy 1-2 times per day/3-5 days per week to address goals.    Further Equipment Recommendations for Discharge: bedside commode    AMPAC: Based on an AM-PAC score of 15/24 and their current ADL deficits; it is recommended that the patient have 3-5 sessions per week of Occupational Therapy at d/c to increase the patient's independence.       This AMPAC score should be considered in conjunction with interdisciplinary team recommendations to determine the most appropriate discharge setting. Patient's social support, diagnosis, medical stability, and prior level of function should also be taken into consideration.     SUBJECTIVE:   Patient stated "My sister." When asked who assists him with toileting    OBJECTIVE DATA SUMMARY:     Past Medical History:   Diagnosis Date    CKD (chronic kidney disease) stage 4, GFR 15-29 ml/min (HCC) 04/03/2021    Depression 04/03/2021    Essential hypertension 04/03/2021    GERD without esophagitis 04/03/2021    Intracerebral hemorrhage (HCC) 02/24/2021    Intracerebral hemorrhage, nontraumatic (HCC)     Metabolic encephalopathy 04/03/2021    Mixed hyperlipidemia 04/03/2021    Obesity 04/03/2021    Seizure disorder (HCC) 04/03/2021    Type 2 diabetes mellitus (HCC) 04/03/2021    Vitamin D deficiency 04/03/2021   History reviewed. No pertinent surgical history.  Barriers to Learning/Limitations: None  Compensate with: visual, verbal, tactile, kinesthetic cues/model    Home Situation:   Home Situation  Home Environment: Rehabilitation facility  # Steps to Enter: 0  One/Two Story Residence: Two story, live on 1st floor  # of Interior Steps: 0  Height of Each Step (in): 0 inches  Interior Rails: None  Retail buyer Available: No  Living Alone: No  Support Systems: Spouse/Significant Other  Patient Expects to be Discharged to:: Skilled nursing facility  Current DME Used/Available at Home: None  [x]   Right hand dominant   []   Left hand dominant    Cognitive/Behavioral Status:  Neurologic State: Alert  Orientation Level: Oriented to person  Cognition: Follows commands  Safety/Judgement: Fall prevention    Skin: visible skin intact  Edema: none noted    Vision/Perceptual:    Tracking: Able to track stimulus in all quadrants w/o difficulty         Coordination: BUE  Coordination: Generally decreased, functional  Fine  Motor Skills-Upper: Left  Impaired;Right Impaired    Gross Motor Skills-Upper: Left Intact;Right Intact    Balance:  Sitting: Impaired  Sitting - Static: Fair (occasional)  Sitting - Dynamic: Fair (occasional)  Standing: Impaired;With support  Standing - Static: Good  Standing - Dynamic : Fair    Strength: BUE  Strength: Generally decreased, functional   Tone & Sensation: BUE  Tone: Normal   Range of Motion: BUE  AROM: Generally decreased, functional   Functional Mobility and Transfers for ADLs:  Bed Mobility:     Supine to Sit: Moderate assistance  Sit to Supine: Moderate assistance  Scooting: Maximum assistance;Assist x2  Transfers:  Sit to Stand: Minimum assistance  Stand to Sit: Moderate assistance   Toilet Transfer : Minimum assistance (HHA to [facility])    ADL Assessment:   Feeding: Setup  Oral Facial Hygiene/Grooming: Contact guard assistance  Bathing: Moderate assistance  Upper Body Dressing: Moderate assistance  Lower Body Dressing: Maximum assistance  Toileting: Maximum assistance   ADL Intervention:   Cognitive Retraining  Safety/Judgement: Fall prevention    Pain:  Pain level pre-treatment: not rated   Pain level post-treatment: not rated    Activity Tolerance:   Fair  Please refer to the flowsheet for vital signs taken during this treatment.  After treatment:   []  Patient left in no apparent distress sitting up in chair  [x]  Patient left in no apparent distress in bed  [x]  Call bell left within reach  [x]  Nursing notified  []  Caregiver present  []  Bed alarm activated    COMMUNICATION/EDUCATION:    [x] Role of Occupational Therapy in the acute care setting  [x]  Home safety education was provided and the patient/caregiver indicated understanding.  [x]  Patient/family have participated as able in goal setting and plan of care.  []  Patient/family agree to work toward stated goals and plan of care.  []  Patient understands intent and goals of therapy, but is neutral about his/her participation.  []  Patient is unable to participate in goal  setting and plan of care.    Thank you for this referral.  Norrine Downer, OTR/L  Time Calculation: 30 mins    Eval Complexity: History: MEDIUM Complexity : Expanded review of history including physical, cognitive and psychosocial  history ;   Examination: MEDIUM Complexity : 3-5 performance deficits relating to physical, cognitive , or psychosocial skils that result in activity limitations and / or participation restrictions;   Decision Making:MEDIUM Complexity : Patient may present with comorbidities that affect occupational performnce. Miniml to moderate modification of tasks or assistance (eg, physical or verbal ) with assesment(s) is necessary to enable patient to complete evaluation     Safety [facility] AM-PAC Daily Activity Inpatient Short Form (6-Clicks)*    How much HELP from another person does the patient currently need.    (If the patient hasn't done an activity recently, how much help from another person do you think he/she would need if he/she tried?)   Total (Total A or Dep)   A Lot  (Mod to Max A)   A Little (Sup or Min A)   None (Mod I to I)   Putting on and taking off regular lower body clothing?   []  1 [x]  2 []  3 []  4   2. Bathing (including washing, rinsing,      drying)?    []  1 [x]  2 []  3 []  4   3. Toileting, which includes using toilet, bedpan or urinal?   []  1 [x]  2 []   3 []  4   4. Putting on and taking off regular upper body clothing?   []  1 []  2 [x]  3 []  4   5. Taking care of personal grooming such as brushing teeth?   []  1 []  2 [x]  3 []  4   6. Eating meals?   []  1 []  2 [x]  3 []  4   15/24  Based on an AM-PAC score of 15/24 and their current ADL deficits; it is recommended that the patient have 3-5 sessions per week of Occupational Therapy at d/c to increase the patient's independence.

## 2021-04-04 NOTE — Progress Notes (Signed)
Patient seen and examined  AKI on CKD 4 ( baseline 3-4 creatinine )--> suspect ATN ,   may have some obst component   Concern for severe Septic   Urosepsis   S/p cysto with stent placed  Left mild hydro with UPJ stone   Urology following   Metabolic acidoses improving   Hypernatremia better    Continue hypotonic bicarb gtt , switch to LR when bicarb > 22  Monitor renal parameters electrolytes q6hrs     Please call with questions    Robyne Askew, MD FASN  Cell [phone]  Pager: [phone]

## 2021-04-05 LAB — POCT GLUCOSE
POC Glucose: 116 mg/dL — ABNORMAL HIGH (ref 70–110)
POC Glucose: 91 mg/dL (ref 70–110)
POC Glucose: 100 mg/dL (ref 70–110)
POC Glucose: 166 mg/dL — ABNORMAL HIGH (ref 70–110)

## 2021-04-05 LAB — CBC
Hematocrit: 28.3 % — ABNORMAL LOW (ref 36.0–48.0)
Hemoglobin: 9.5 g/dL — ABNORMAL LOW (ref 13.0–16.0)
MCH: 31.9 PG (ref 24.0–34.0)
MCHC: 33.6 g/dL (ref 31.0–37.0)
MCV: 95 FL (ref 78.0–100.0)
MPV: 9.4 FL (ref 9.2–11.8)
NRBC Absolute: 0 10*3/uL (ref 0.00–0.01)
Nucleated RBCs: 0 PER 100 WBC
Platelets: 545 10*3/uL — ABNORMAL HIGH (ref 135–420)
RBC: 2.98 M/uL — ABNORMAL LOW (ref 4.35–5.65)
RDW: 12.3 % (ref 11.6–14.5)
WBC: 22.9 10*3/uL — ABNORMAL HIGH (ref 4.6–13.2)

## 2021-04-05 LAB — BASIC METABOLIC PANEL
Anion Gap: 9 mmol/L (ref 3.0–18)
BUN: 89 MG/DL — ABNORMAL HIGH (ref 7.0–18)
Bun/Cre Ratio: 10 — ABNORMAL LOW (ref 12–20)
CO2: 18 mmol/L — ABNORMAL LOW (ref 21–32)
Calcium: 8.8 MG/DL (ref 8.5–10.1)
Chloride: 117 mmol/L — ABNORMAL HIGH (ref 100–111)
Creatinine: 8.81 MG/DL — ABNORMAL HIGH (ref 0.6–1.3)
EGFR IF NonAfrican American: 6 mL/min/{1.73_m2} — ABNORMAL LOW (ref >60)
GFR African American: 8 mL/min/{1.73_m2} — ABNORMAL LOW (ref >60)
Glucose: 88 mg/dL (ref 74–99)
Potassium: 4.8 mmol/L (ref 3.5–5.5)
Sodium: 144 mmol/L (ref 136–145)

## 2021-04-05 LAB — VANCOMYCIN LEVEL, RANDOM: Vancomycin Rm: 16.4 UG/ML (ref 5.0–40.0)

## 2021-04-05 LAB — GLUCOSE, POC
Glucose (POC): 116 mg/dL — ABNORMAL HIGH (ref 70–110)
Glucose (POC): 91 mg/dL (ref 70–110)
Glucose (POC): 100 mg/dL (ref 70–110)
Glucose (POC): 166 mg/dL — ABNORMAL HIGH (ref 70–110)

## 2021-04-05 LAB — METABOLIC PANEL, BASIC
Anion gap: 9 mmol/L (ref 3.0–18)
BUN/Creatinine ratio: 10 — ABNORMAL LOW (ref 12–20)
BUN: 89 MG/DL — ABNORMAL HIGH (ref 7.0–18)
CO2: 18 mmol/L — ABNORMAL LOW (ref 21–32)
Calcium: 8.8 MG/DL (ref 8.5–10.1)
Chloride: 117 mmol/L — ABNORMAL HIGH (ref 100–111)
Creatinine: 8.81 MG/DL — ABNORMAL HIGH (ref 0.6–1.3)
GFR est AA: 8 mL/min/{1.73_m2} — ABNORMAL LOW (ref >60)
GFR est non-AA: 6 mL/min/{1.73_m2} — ABNORMAL LOW (ref >60)
Glucose: 88 mg/dL (ref 74–99)
Potassium: 4.8 mmol/L (ref 3.5–5.5)
Sodium: 144 mmol/L (ref 136–145)

## 2021-04-05 LAB — VANCOMYCIN, RANDOM: Vancomycin, random: 16.4 UG/ML (ref 5.0–40.0)

## 2021-04-05 LAB — CBC W/O DIFF
ABSOLUTE NRBC: 0 10*3/uL (ref 0.00–0.01)
HCT: 28.3 % — ABNORMAL LOW (ref 36.0–48.0)
HGB: 9.5 g/dL — ABNORMAL LOW (ref 13.0–16.0)
MCH: 31.9 PG (ref 24.0–34.0)
MCHC: 33.6 g/dL (ref 31.0–37.0)
MCV: 95 FL (ref 78.0–100.0)
MPV: 9.4 FL (ref 9.2–11.8)
NRBC: 0 PER 100 WBC
PLATELET: 545 10*3/uL — ABNORMAL HIGH (ref 135–420)
RBC: 2.98 M/uL — ABNORMAL LOW (ref 4.35–5.65)
RDW: 12.3 % (ref 11.6–14.5)
WBC: 22.9 10*3/uL — ABNORMAL HIGH (ref 4.6–13.2)

## 2021-04-05 MED ORDER — SODIUM BICARBONATE 8.4 % IV
1 mEq/mL (8.4 %) | INTRAVENOUS | Status: DC
Start: 2021-04-05 — End: 2021-04-06
  Administered 2021-04-05 – 2021-04-06 (×3): via INTRAVENOUS

## 2021-04-05 MED ORDER — AMLODIPINE 10 MG TAB
10 mg | Freq: Every day | ORAL | Status: DC
Start: 2021-04-05 — End: 2021-04-11
  Administered 2021-04-05 – 2021-04-11 (×6): via ORAL

## 2021-04-05 MED ORDER — VANCOMYCIN IN 0.9% SODIUM CHLORIDE 1.5 G/500 ML IV
1.5 g/500 mL | Freq: Once | INTRAVENOUS | Status: AC
Start: 2021-04-05 — End: 2021-04-05
  Administered 2021-04-05: 22:00:00 via INTRAVENOUS

## 2021-04-05 MED FILL — ESCITALOPRAM 10 MG TAB: 10 mg | ORAL | Qty: 1

## 2021-04-05 MED FILL — DEXTROSE 5% IN WATER (D5W) IV: INTRAVENOUS | Qty: 1000

## 2021-04-05 MED FILL — HYDRALAZINE 50 MG TAB: 50 mg | ORAL | Qty: 1

## 2021-04-05 MED FILL — THIAMINE HCL 100 MG TAB: 100 mg | ORAL | Qty: 1

## 2021-04-05 MED FILL — AMLODIPINE 10 MG TAB: 10 mg | ORAL | Qty: 1

## 2021-04-05 MED FILL — CEFEPIME 1 GRAM SOLUTION FOR INJECTION: 1 gram | INTRAMUSCULAR | Qty: 1

## 2021-04-05 MED FILL — CYANOCOBALAMIN (VITAMIN B-12) 2,500 MCG SUBLINGUAL TAB: 2500 mcg | SUBLINGUAL | Qty: 1

## 2021-04-05 MED FILL — METOPROLOL TARTRATE 50 MG TAB: 50 mg | ORAL | Qty: 1

## 2021-04-05 MED FILL — LEVETIRACETAM 500 MG/5 ML IV SOLN: 500 mg/5 mL | INTRAVENOUS | Qty: 5

## 2021-04-05 MED FILL — ROSUVASTATIN 10 MG TAB: 10 mg | ORAL | Qty: 1

## 2021-04-05 MED FILL — VANCOMYCIN 10 GRAM IV SOLR: 10 gram | INTRAVENOUS | Qty: 1500

## 2021-04-05 MED FILL — PANTOPRAZOLE 40 MG TAB, DELAYED RELEASE: 40 mg | ORAL | Qty: 1

## 2021-04-05 NOTE — Progress Notes (Signed)
Progress Notes by Georgiann Cocker, MD at 04/05/21 1519                Author: Georgiann Cocker, MD  Service: Nephrology  Author Type: Physician       Filed: 04/05/21 1522  Date of Service: 04/05/21 1519  Status: Signed          Editor: Georgiann Cocker, MD (Physician)                       In Patient Progress note      Admit Date: 04/03/2021        Impression:     1) AKI on CKD 4 ( baseline 3-4 creatinine ) d/t ATN in setting of sepsis    , some obst component causing AKI as well   2) Concern for severe Septis ,Urosepsis d/t gm -ve rods   3) S/p cysto with stent placed   4) Left mild hydro with UPJ stone ,Urology following    5) Metabolic acidoses improving    6) Hypernatremia better   7) Altered mental status    8) h/o intracranial hemorrhage        Plan:   1) strict intake/output    2) Continue hypotonic bicarb gtt , switch to LR when bicarb > 22   Monitor renal parameters electrolytes q6hrs    3) avoid IV contrast , nephrotoxins   4) renally dose AB    5) no acute indication for RRT   6) supportive care per primary team        discussed with nursing/primary team,       Please call with questions,       Robyne Askew, MD FASN   Cell [phone]   Pager: [phone]          Subjective:        - No acute over night events.   - respiratory - stable   - hemodynamics - stable, no pressrs   - UOP-excellent   - Nutrition -ok        Objective:        Visit Vitals      BP  (!) 180/78 (BP 1 Location: Right lower arm, BP Patient Position: At rest)        Pulse  99     Temp  98.6 ??F (37 ??C)     Resp  18     Ht  5\' 3"  (1.6 m)     Wt  109.3 kg (241 lb)     SpO2  95%        BMI  42.69 kg/m??              Intake/Output Summary (Last 24 hours) at 04/05/2021 1519   Last data filed at 04/05/2021 1327     Gross per 24 hour        Intake  560 ml        Output  2325 ml        Net  -1765 ml             Physical Exam:           Patient is in no apparent distress.    HEENT: mmm   Neck: no cervical lymphadenopathy or thyromegaly.     Lungs: good air entry, clear to auscultation bilaterally.    Cardiovascular system: S1, S2, regular rate and rhythm.    Abdomen: soft, non tender, non distended.  Extremities: no clubbing, cyanosis or edema.    Integumentary: skin is grossly intact.              Data Review:        Recent Labs           04/05/21   0216     WBC  22.9*     RBC  2.98*     HCT  28.3*     MCV  95.0     MCH  31.9     MCHC  33.6        RDW  12.3          Recent Labs             04/05/21   0216  04/04/21   0140  04/03/21   1559     BUN  89*  98*  104*     CREA  8.81*  9.42*  9.73*     CA  8.8  8.4*  9.1   9.2     ALB   --    --   2.3*     K  4.8  5.2  4.7     NA  144  146*  146*     CL  117*  116*  117*     CO2  18*  19*  17*          GLU  88  125*  97           Georgiann Cocker, MD

## 2021-04-05 NOTE — Progress Notes (Signed)
0400-Pt often C/O urge to void. Foley is patent without blood or clots. Pt unable to remember that he has a foley.

## 2021-04-05 NOTE — Progress Notes (Signed)
 Chaplain conducted an initial consultation and Spiritual Assessment for Thomas Jackson, who is a 50 y.o.,male. Patient's Primary Language is: [place].   According to the patient's EMR Religious Affiliation is: No preference.     The reason the Patient came to the hospital is:   Patient Active Problem List    Diagnosis Date Noted    Obstructive uropathy 04/03/2021    CKD (chronic kidney disease) stage 4, GFR 15-29 ml/min (HCC) 04/03/2021    Seizure disorder (HCC) 04/03/2021    Type 2 diabetes mellitus (HCC) 04/03/2021    Metabolic encephalopathy 04/03/2021    Depression 04/03/2021    Essential hypertension 04/03/2021    Mixed hyperlipidemia 04/03/2021    GERD without esophagitis 04/03/2021    Vitamin D deficiency 04/03/2021    Obesity 04/03/2021    Anemia 04/03/2021    UTI (urinary tract infection) 04/03/2021    AKI (acute kidney injury) (HCC) 04/03/2021    Sepsis (HCC) 04/03/2021    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   Patient is confused, wanting to get up to go to the bathroom amidst explaining to him that he has a catheter and has to wait for his nurse if he needs to go to the bathroom.  A nurse aid came and ordered him to stay in bed while I called for his RN.    The following outcomes where achieved:  Unable to assess patient at this time.  Patient does not process feeling about current hospitalization.  Patient unable to express his spiritual needs at this time.    Assessment:  Patient does not have any religious/cultural needs that will affect patient's preferences in health care.  There are no spiritual or religious issues which require intervention at this time.     Plan:  Chaplains will continue to follow and will provide pastoral care on an as needed/requested basis.  Chaplain recommends bedside caregivers page chaplain on duty if patient shows signs of  acute spiritual or emotional distress.    Chaplain Ping Camano, [facility]  Spiritual Care   [phone]

## 2021-04-05 NOTE — Progress Notes (Signed)
[facility] Hospitalist Group  Progress Note    Patient: Thomas Jackson Age: 50 y.o. DOB: 04/06/1971 MR#: [id number] SSN: [id number]  Date/Time: 04/05/2021     Subjective: Patient lying in the bed, alert awake, talks selectively, answers most of the questions yes and no, sometimes says I am fine.  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  Per RN, patient confused last night was trying to get out of the bed.  Patient more calm and cooperative but difficult communication due to receptive aphasia.     Assessment/Plan:     Hospital Problems  Date Reviewed: 05-03-2021            Codes Class Noted POA    Obstructive uropathy ICD-10-CM: N13.9  ICD-9-CM: 599.60  2021-05-03 Unknown        CKD (chronic kidney disease) stage 4, GFR 15-29 ml/min (HCC) ICD-10-CM: N18.4  ICD-9-CM: 585.4  May 03, 2021 Yes        Seizure disorder (HCC) ICD-10-CM: G40.909  ICD-9-CM: 345.90  2021/05/03 Yes        Type 2 diabetes mellitus (HCC) ICD-10-CM: E11.9  ICD-9-CM: 250.00  2021/05/03 Yes        Metabolic encephalopathy ICD-10-CM: G93.41  ICD-9-CM: 348.31  2021-05-03 Yes        Depression ICD-10-CM: F32.A  ICD-9-CM: 311  05-03-2021 Yes        Essential hypertension ICD-10-CM: I10  ICD-9-CM: 401.9  May 03, 2021 Yes        Mixed hyperlipidemia ICD-10-CM: E78.2  ICD-9-CM: 272.2  May 03, 2021 Yes        GERD without esophagitis ICD-10-CM: K21.9  ICD-9-CM: 530.81  2021/05/03 Yes        Vitamin D deficiency ICD-10-CM: E55.9  ICD-9-CM: 268.9  05-03-2021 Yes        Obesity ICD-10-CM: E66.9  ICD-9-CM: 278.00  May 03, 2021 Yes        Anemia ICD-10-CM: D64.9  ICD-9-CM: 285.9  May 03, 2021 Yes        UTI (urinary tract infection) ICD-10-CM: N39.0  ICD-9-CM: 599.0  2021-05-03 Yes        AKI (acute kidney injury) (HCC) ICD-10-CM: N17.9  ICD-9-CM: 584.9  05-03-21 Yes        Sepsis (HCC) ICD-10-CM: A41.9  ICD-9-CM: 038.9, 995.91  05/03/21 Yes        Intracerebral hemorrhage (HCC)  ICD-10-CM: I61.9  ICD-9-CM: 431  02/24/2021 Yes          Sepsis POA (HR 102, WBC 24K, complicated UTI)  Complicated UTI  Obstructive uropathy, left renal stone with hydronephrosis.  Status post cystoscopy, stent placement by urology  AKI on CKD 4, metabolic acidosis  Uremia  Hypernatremia, decreased free water intake  Hypertension  Leukocytosis  Thrombocytosis, reactive  Normocytic anemia  History of intracranial hemorrhage, stable  Acute metabolic encephalopathy due to multifactorial including sepsis, uremia, hypernatremia,  Seizure disorder, stable  Type 2 diabetes mellitus      Plan:  Urine culture growing gram-negative rods, will continue cefepime, discontinue vancomycin.  Further plan per urology for definitive stone management  Leukocytosis trending down  We will continue hydralazine, metoprolol and resume amlodipine, adjust based on the blood pressure  No IV fluids noted, discussed with nephrology, resume IV fluids with bicarb  No need for HD currently  Continue Keppra  Continue PPI  We will get PT/OT eval and treatment    Discussed with patient's fianc?? Marcelino Duster and also Joaquin Music POA over the phone and  explained in detail about my above plan care.  POA also okay with communicating with fianc?? every day and give them update.  Discussed about advance directives, they want full code.      I spent 40 minutes with the patient in face-to-face consultation, of which greater than 50% was spent in counseling and coordination of care as described above.    Case discussed with:  [x] Patient  [x] Family  [x] Nursing  [] Case Management  DVT Prophylaxis:  [] Lovenox  [] Hep SQ  [x] SCDs  [] Coumadin   [] Eliquis/Xarelto     Objective:   VS: Visit Vitals  BP (!) 171/66 (BP 1 Location: Left upper arm, BP Patient Position: At rest)   Pulse 89   Temp 97.8 ??F (36.6 ??C)   Resp 16   Ht 5\' 3"  (1.6 m)   Wt 109.3 kg (241 lb)   SpO2 95%   BMI 42.69 kg/m??      Tmax/24hrs: Temp (24hrs), Avg:98.1 ??F (36.7 ??C), Min:97.7 ??F (36.5 ??C),  Max:98.6 ??F (37 ??C)  IOBRIEF  Intake/Output Summary (Last 24 hours) at 04/05/2021 1104  Last data filed at 04/05/2021 1008  Gross per 24 hour   Intake 340 ml   Output 3150 ml   Net -2810 ml       General:  Alert, cooperative, no acute distress    HEENT: PERRLA, anicteric sclerae.  Pulmonary:  CTA Bilaterally. No Wheezing/Rales.  Cardiovascular: Regular rate and Rhythm.  GI:  Soft, Non distended, Non tender. + Bowel sounds.  Extremities:  No edema. No calf tenderness.   Psych: Not anxious or agitated.  Neurologic: Alert awake, receptive aphasia, follows commands inconsistently, moves left side better than right side.  Additional:    Medications:   Current Facility-Administered Medications   Medication Dose Route Frequency    sodium bicarbonate (8.4%) 100 mEq in dextrose 5% 1,000 mL infusion   IntraVENous CONTINUOUS    insulin lispro (HUMALOG) injection   SubCUTAneous AC&HS    glucose chewable tablet 16 g  4 Tablet Oral PRN    glucagon (GLUCAGEN) injection 1 mg  1 mg IntraMUSCular PRN    dextrose 10% infusion 0-250 mL  0-250 mL IntraVENous PRN    [Held by provider] amLODIPine (NORVASC) tablet 10 mg  10 mg Oral DAILY    pantoprazole (PROTONIX) tablet 40 mg  40 mg Oral ACB    rosuvastatin (CRESTOR) tablet 10 mg  10 mg Oral QHS    sodium chloride (NS) flush 5-40 mL  5-40 mL IntraVENous Q8H    sodium chloride (NS) flush 5-40 mL  5-40 mL IntraVENous PRN    acetaminophen (TYLENOL) tablet 650 mg  650 mg Oral Q6H PRN    Or    acetaminophen (TYLENOL) suppository 650 mg  650 mg Rectal Q6H PRN    polyethylene glycol (MIRALAX) packet 17 g  17 g Oral DAILY PRN    ondansetron (ZOFRAN ODT) tablet 4 mg  4 mg Oral Q8H PRN    Or    ondansetron (ZOFRAN) injection 4 mg  4 mg IntraVENous Q6H PRN    metoprolol tartrate (LOPRESSOR) tablet 50 mg  50 mg Oral BID    levETIRAcetam (KEPPRA) 500 mg in 0.9% sodium chloride (MBP/ADV) 100 mL MBP  500 mg IntraVENous Q12H    hydrALAZINE (APRESOLINE) tablet 50 mg  50 mg Oral TID    escitalopram oxalate  (LEXAPRO) tablet 5 mg  5 mg Oral QPM    thiamine HCL (B-1) tablet 100 mg  100 mg Oral DAILY  cyanocobalamin (VITAMIN B12) sublingual tablet 2,500 mcg  2,500 mcg Oral DAILY    Vancomycin: Pharmacy To Dose   Other Rx Dosing/Monitoring    cefepime (MAXIPIME) 1 g in 0.9% sodium chloride (MBP/ADV) 50 mL MBP  1 g IntraVENous Q24H       Labs:    Recent Results (from the past 24 hour(s))   GLUCOSE, POC    Collection Time: 04/04/21 11:28 AM   Result Value Ref Range    Glucose (POC) 94 70 - 110 mg/dL   GLUCOSE, POC    Collection Time: 04/04/21  4:43 PM   Result Value Ref Range    Glucose (POC) 94 70 - 110 mg/dL   GLUCOSE, POC    Collection Time: 04/04/21  9:06 PM   Result Value Ref Range    Glucose (POC) 116 (H) 70 - 110 mg/dL   METABOLIC PANEL, BASIC    Collection Time: 04/05/21  2:16 AM   Result Value Ref Range    Sodium 144 136 - 145 mmol/L    Potassium 4.8 3.5 - 5.5 mmol/L    Chloride 117 (H) 100 - 111 mmol/L    CO2 18 (L) 21 - 32 mmol/L    Anion gap 9 3.0 - 18 mmol/L    Glucose 88 74 - 99 mg/dL    BUN 89 (H) 7.0 - 18 MG/DL    Creatinine 4.17 (H) 0.6 - 1.3 MG/DL    BUN/Creatinine ratio 10 (L) 12 - 20      GFR est AA 8 (L) >60 ml/min/1.45m2    GFR est non-AA 6 (L) >60 ml/min/1.13m2    Calcium 8.8 8.5 - 10.1 MG/DL   CBC W/O DIFF    Collection Time: 04/05/21  2:16 AM   Result Value Ref Range    WBC 22.9 (H) 4.6 - 13.2 K/uL    RBC 2.98 (L) 4.35 - 5.65 M/uL    HGB 9.5 (L) 13.0 - 16.0 g/dL    HCT 40.8 (L) 14.4 - 48.0 %    MCV 95.0 78.0 - 100.0 FL    MCH 31.9 24.0 - 34.0 PG    MCHC 33.6 31.0 - 37.0 g/dL    RDW 81.8 56.3 - 14.9 %    PLATELET 545 (H) 135 - 420 K/uL    MPV 9.4 9.2 - 11.8 FL    NRBC 0.0 0 PER 100 WBC    ABSOLUTE NRBC 0.00 0.00 - 0.01 K/uL   VANCOMYCIN, RANDOM    Collection Time: 04/05/21  2:16 AM   Result Value Ref Range    Vancomycin, random 16.4 5.0 - 40.0 UG/ML   GLUCOSE, POC    Collection Time: 04/05/21  8:04 AM   Result Value Ref Range    Glucose (POC) 91 70 - 110 mg/dL       Signed By: Woodward Ku, MD     April 05, 2021      Disclaimer: Sections of this note are dictated using utilizing voice recognition software.  Minor typographical errors may be present. If questions arise, please do not hesitate to contact me or call our department.

## 2021-04-05 NOTE — Progress Notes (Signed)
Patient does not answer most questions appropriately.Patient keeps getting out of bed. Patient also keeps taking off telemetry box. Patient is fall risk due to orientation and foley.   1056 Physician at bedside patient took medications

## 2021-04-05 NOTE — Progress Notes (Signed)
 [place] [facility]   Pharmacy Pharmacokinetic Monitoring Service - Vancomycin    Consulting Provider: Tobie POUR, NP   Indication: Urinary Tract Infection  Target Concentration: Dosing based on anticipated concentration <15 mg/L due to renal impairment/insufficiency  Day of Therapy: 3 of 5  Additional Antimicrobials: Cefepime    Pertinent Laboratory Values:   Temp: 98.6 F (37 C)  Weight: 109.3 kg (241 lb)  Recent Labs     04/05/21  0216 04/04/21  0140 04/03/21  1559   CREA 8.81* 9.42* 9.73*   BUN 89* 98* 104*   WBC 22.9* 25.7* 24.5*   PCT  --   --  1.02       Estimated Creatinine Clearance: 11.1 mL/min (A) (based on SCr of 8.81 mg/dL (H)).    Pertinent Cultures:  Culture Date Source Results   8/25 blood ngtd   8/25 urine > 100,000 GNR   MRSA Nasal Swab: Ordered by provider, awaiting results    Assessment:  Date/Time Current Dose Concentration Timing of Concentration (h) AUC   8/25 1500mg  - - -   8/26  24.3 3h    8/27  16.4 28 hrs    Note: Serum concentrations collected for AUC dosing may appear elevated if collected in close proximity to the dose administered, this is not necessarily an indication of toxicity    Plan:  Concentration-guided dosing due to renal impairment  Vancomycin 1500 mg x 1 dose today   Renal labs as indicated   Vancomycin concentration ordered for Monday, 8/29 AM  Pharmacy will continue to monitor patient and adjust therapy as indicated    Thank you for the consult,  Lori Palagyi, [facility]  04/05/2021

## 2021-04-06 LAB — CBC WITH AUTO DIFFERENTIAL
Band Neutrophils: 1 %
Basophils %: 0 % (ref 0–2)
Basophils Absolute: 0 10*3/uL (ref 0.0–0.1)
Eosinophils %: 3 % (ref 0–5)
Eosinophils Absolute: 0.6 10*3/uL — ABNORMAL HIGH (ref 0.0–0.4)
Granulocyte Absolute Count: 0 10*3/uL (ref 0.00–0.04)
Hematocrit: 27.1 % — ABNORMAL LOW (ref 36.0–48.0)
Hemoglobin: 9.2 g/dL — ABNORMAL LOW (ref 13.0–16.0)
Immature Granulocytes: 0 % (ref 0.0–0.5)
Lymphocytes %: 16 % — ABNORMAL LOW (ref 21–52)
Lymphocytes Absolute: 3.4 10*3/uL (ref 0.9–3.6)
MCH: 31.7 PG (ref 24.0–34.0)
MCHC: 33.9 g/dL (ref 31.0–37.0)
MCV: 93.4 FL (ref 78.0–100.0)
MPV: 9.5 FL (ref 9.2–11.8)
Monocytes %: 1 % — ABNORMAL LOW (ref 3–10)
Monocytes Absolute: 0.2 10*3/uL (ref 0.05–1.2)
Myelocyte Percent: 1 %
NRBC Absolute: 0.02 10*3/uL — ABNORMAL HIGH (ref 0.00–0.01)
Neutrophils %: 78 % — ABNORMAL HIGH (ref 40–73)
Neutrophils Absolute: 16.8 10*3/uL — ABNORMAL HIGH (ref 1.8–8.0)
Nucleated RBCs: 0.1 PER 100 WBC — ABNORMAL HIGH
Platelet Comment: INCREASED
Platelets: 547 10*3/uL — ABNORMAL HIGH (ref 135–420)
RBC: 2.9 M/uL — ABNORMAL LOW (ref 4.35–5.65)
RDW: 12.1 % (ref 11.6–14.5)
WBC: 21.3 10*3/uL — ABNORMAL HIGH (ref 4.6–13.2)

## 2021-04-06 LAB — BASIC METABOLIC PANEL
Anion Gap: 6 mmol/L (ref 3.0–18)
BUN: 79 MG/DL — ABNORMAL HIGH (ref 7.0–18)
Bun/Cre Ratio: 10 — ABNORMAL LOW (ref 12–20)
CO2: 23 mmol/L (ref 21–32)
Calcium: 8.3 MG/DL — ABNORMAL LOW (ref 8.5–10.1)
Chloride: 113 mmol/L — ABNORMAL HIGH (ref 100–111)
Creatinine: 7.75 MG/DL — ABNORMAL HIGH (ref 0.6–1.3)
EGFR IF NonAfrican American: 7 mL/min/{1.73_m2} — ABNORMAL LOW (ref >60)
GFR African American: 9 mL/min/{1.73_m2} — ABNORMAL LOW (ref >60)
Glucose: 113 mg/dL — ABNORMAL HIGH (ref 74–99)
Potassium: 4.1 mmol/L (ref 3.5–5.5)
Sodium: 142 mmol/L (ref 136–145)

## 2021-04-06 LAB — CULTURE, URINE
Colonies Counted: >100000
Colony Count: >100000

## 2021-04-06 LAB — POCT GLUCOSE
POC Glucose: 98 mg/dL (ref 70–110)
POC Glucose: 118 mg/dL — ABNORMAL HIGH (ref 70–110)
POC Glucose: 99 mg/dL (ref 70–110)
POC Glucose: 89 mg/dL (ref 70–110)

## 2021-04-06 LAB — METABOLIC PANEL, BASIC
Anion gap: 6 mmol/L (ref 3.0–18)
BUN/Creatinine ratio: 10 — ABNORMAL LOW (ref 12–20)
BUN: 79 MG/DL — ABNORMAL HIGH (ref 7.0–18)
CO2: 23 mmol/L (ref 21–32)
Calcium: 8.3 MG/DL — ABNORMAL LOW (ref 8.5–10.1)
Chloride: 113 mmol/L — ABNORMAL HIGH (ref 100–111)
Creatinine: 7.75 MG/DL — ABNORMAL HIGH (ref 0.6–1.3)
GFR est AA: 9 mL/min/{1.73_m2} — ABNORMAL LOW (ref >60)
GFR est non-AA: 7 mL/min/{1.73_m2} — ABNORMAL LOW (ref >60)
Glucose: 113 mg/dL — ABNORMAL HIGH (ref 74–99)
Potassium: 4.1 mmol/L (ref 3.5–5.5)
Sodium: 142 mmol/L (ref 136–145)

## 2021-04-06 LAB — GLUCOSE, POC
Glucose (POC): 98 mg/dL (ref 70–110)
Glucose (POC): 118 mg/dL — ABNORMAL HIGH (ref 70–110)
Glucose (POC): 99 mg/dL (ref 70–110)
Glucose (POC): 89 mg/dL (ref 70–110)

## 2021-04-06 LAB — CBC WITH AUTOMATED DIFF
ABS. BASOPHILS: 0 10*3/uL (ref 0.0–0.1)
ABS. EOSINOPHILS: 0.6 10*3/uL — ABNORMAL HIGH (ref 0.0–0.4)
ABS. IMM. GRANS.: 0 10*3/uL (ref 0.00–0.04)
ABS. LYMPHOCYTES: 3.4 10*3/uL (ref 0.9–3.6)
ABS. MONOCYTES: 0.2 10*3/uL (ref 0.05–1.2)
ABS. NEUTROPHILS: 16.8 10*3/uL — ABNORMAL HIGH (ref 1.8–8.0)
ABSOLUTE NRBC: 0.02 10*3/uL — ABNORMAL HIGH (ref 0.00–0.01)
BAND NEUTROPHILS: 1 %
BASOPHILS: 0 % (ref 0–2)
EOSINOPHILS: 3 % (ref 0–5)
HCT: 27.1 % — ABNORMAL LOW (ref 36.0–48.0)
HGB: 9.2 g/dL — ABNORMAL LOW (ref 13.0–16.0)
IMMATURE GRANULOCYTES: 0 % (ref 0.0–0.5)
LYMPHOCYTES: 16 % — ABNORMAL LOW (ref 21–52)
MCH: 31.7 PG (ref 24.0–34.0)
MCHC: 33.9 g/dL (ref 31.0–37.0)
MCV: 93.4 FL (ref 78.0–100.0)
MONOCYTES: 1 % — ABNORMAL LOW (ref 3–10)
MPV: 9.5 FL (ref 9.2–11.8)
MYELOCYTES: 1 %
NEUTROPHILS: 78 % — ABNORMAL HIGH (ref 40–73)
NRBC: 0.1 PER 100 WBC — ABNORMAL HIGH
PLATELET COMMENTS: INCREASED
PLATELET: 547 10*3/uL — ABNORMAL HIGH (ref 135–420)
RBC: 2.9 M/uL — ABNORMAL LOW (ref 4.35–5.65)
RDW: 12.1 % (ref 11.6–14.5)
WBC: 21.3 10*3/uL — ABNORMAL HIGH (ref 4.6–13.2)

## 2021-04-06 MED ORDER — HYDRALAZINE 50 MG TAB
50 mg | Freq: Three times a day (TID) | ORAL | Status: DC
Start: 2021-04-06 — End: 2021-04-11
  Administered 2021-04-06 – 2021-04-11 (×13): via ORAL

## 2021-04-06 MED ORDER — LACTATED RINGERS IV
INTRAVENOUS | Status: AC
Start: 2021-04-06 — End: 2021-04-07
  Administered 2021-04-06: 15:00:00 via INTRAVENOUS

## 2021-04-06 MED FILL — LACTATED RINGERS IV: INTRAVENOUS | Qty: 1000

## 2021-04-06 MED FILL — METOPROLOL TARTRATE 50 MG TAB: 50 mg | ORAL | Qty: 1

## 2021-04-06 MED FILL — THIAMINE HCL 100 MG TAB: 100 mg | ORAL | Qty: 1

## 2021-04-06 MED FILL — ESCITALOPRAM 10 MG TAB: 10 mg | ORAL | Qty: 1

## 2021-04-06 MED FILL — LEVETIRACETAM 500 MG/5 ML IV SOLN: 500 mg/5 mL | INTRAVENOUS | Qty: 5

## 2021-04-06 MED FILL — CYANOCOBALAMIN (VITAMIN B-12) 2,500 MCG SUBLINGUAL TAB: 2500 mcg | SUBLINGUAL | Qty: 1

## 2021-04-06 MED FILL — ROSUVASTATIN 10 MG TAB: 10 mg | ORAL | Qty: 1

## 2021-04-06 MED FILL — HYDRALAZINE 50 MG TAB: 50 mg | ORAL | Qty: 1

## 2021-04-06 MED FILL — PANTOPRAZOLE 40 MG TAB, DELAYED RELEASE: 40 mg | ORAL | Qty: 1

## 2021-04-06 MED FILL — DEXTROSE 5% IN WATER (D5W) IV: INTRAVENOUS | Qty: 1000

## 2021-04-06 MED FILL — AMLODIPINE 10 MG TAB: 10 mg | ORAL | Qty: 1

## 2021-04-06 MED FILL — HYDRALAZINE 50 MG TAB: 50 mg | ORAL | Qty: 2

## 2021-04-06 MED FILL — CEFEPIME 1 GRAM SOLUTION FOR INJECTION: 1 gram | INTRAMUSCULAR | Qty: 1

## 2021-04-06 NOTE — Progress Notes (Signed)
Acidoses has improved  D/c bicarb gtt  Switch to LR @ 125 cc/hrs   Seeing patient shortly, progress note to follow    Please call with questions    Robyne Askew, MD FASN  Cell [phone]  Pager: [phone]

## 2021-04-06 NOTE — Progress Notes (Signed)
[facility]  Progress Note    Patient: Thomas Jackson Age: 50 y.o. DOB: 1971-01-16 MR#: [id number] SSN: [id number]  Date/Time: 04/06/2021     Subjective: Patient lying in the bed, alert awake, talks selectively, answers most of the questions yes and no, sometimes says I am fine.     Assessment/Plan:     Hospital Problems  Date Reviewed: 20-Apr-2021            Codes Class Noted POA    Obstructive uropathy ICD-10-CM: N13.9  ICD-9-CM: 599.60  20-Apr-2021 Unknown        CKD (chronic kidney disease) stage 4, GFR 15-29 ml/min (HCC) ICD-10-CM: N18.4  ICD-9-CM: 585.4  04-20-2021 Yes        Seizure disorder (HCC) ICD-10-CM: G40.909  ICD-9-CM: 345.90  04-20-2021 Yes        Type 2 diabetes mellitus (HCC) ICD-10-CM: E11.9  ICD-9-CM: 250.00  04/20/21 Yes        Metabolic encephalopathy ICD-10-CM: G93.41  ICD-9-CM: 348.31  2021/04/20 Yes        Depression ICD-10-CM: F32.A  ICD-9-CM: 311  20-Apr-2021 Yes        Essential hypertension ICD-10-CM: I10  ICD-9-CM: 401.9  04-20-2021 Yes        Mixed hyperlipidemia ICD-10-CM: E78.2  ICD-9-CM: 272.2  Apr 20, 2021 Yes        GERD without esophagitis ICD-10-CM: K21.9  ICD-9-CM: 530.81  2021/04/20 Yes        Vitamin D deficiency ICD-10-CM: E55.9  ICD-9-CM: 268.9  Apr 20, 2021 Yes        Obesity ICD-10-CM: E66.9  ICD-9-CM: 278.00  2021/04/20 Yes        Anemia ICD-10-CM: D64.9  ICD-9-CM: 285.9  2021/04/20 Yes        UTI (urinary tract infection) ICD-10-CM: N39.0  ICD-9-CM: 599.0  04-20-2021 Yes        AKI (acute kidney injury) (HCC) ICD-10-CM: N17.9  ICD-9-CM: 584.9  2021-04-20 Yes        Sepsis (HCC) ICD-10-CM: A41.9  ICD-9-CM: 038.9, 995.91  2021/04/20 Yes        Intracerebral hemorrhage (HCC) ICD-10-CM: I61.9  ICD-9-CM: 431  02/24/2021 Yes         Sepsis POA (HR 102, WBC 24K, complicated UTI)  Complicated UTI  Obstructive uropathy, left renal stone with hydronephrosis.  Status post cystoscopy, stent placement by urology  AKI on CKD 4,   Metabolic acidosis,  improved  Uremia  Hypernatremia, decreased free water intake  Hypertension  Leukocytosis, trending down  Thrombocytosis, reactive  Normocytic anemia  History of intracranial hemorrhage, stable  Acute metabolic encephalopathy due to multifactorial including sepsis, uremia, hypernatremia,  Seizure disorder, stable  Type 2 diabetes mellitus      Plan:  Urine culture growing gram-negative rods, will continue cefepime, discontinue vancomycin.  Will consult ID  Further plan per urology for definitive stone management  We will continue hydralazine, metoprolol and resume amlodipine, adjust based on the blood pressure  Continue IV fluids, off bicarb drip per nephrology  No need for HD currently  Continue Keppra  Continue PPI  PT/OT eval and treatment, recommend SNF placement    Discussed with the patient  Called and left a message to fianc??      Case discussed with:  [x] Patient  [x] Family  [x] Nursing  [] Case Management  DVT Prophylaxis:  [] Lovenox  [] Hep SQ  [x] SCDs  [] Coumadin   [] Eliquis/Xarelto     Objective:   VS: Visit Vitals  BP (!) 171/81 (BP 1  Location: Right upper arm, BP Patient Position: At rest)   Pulse 86   Temp 98.4 ??F (36.9 ??C)   Resp 20   Ht 5\' 3"  (1.6 m)   Wt 110.2 kg (243 lb)   SpO2 91%   BMI 43.05 kg/m??      Tmax/24hrs: Temp (24hrs), Avg:98.3 ??F (36.8 ??C), Min:97.6 ??F (36.4 ??C), Max:98.7 ??F (37.1 ??C)  IOBRIEF  Intake/Output Summary (Last 24 hours) at 04/06/2021 1402  Last data filed at 04/06/2021 1348  Gross per 24 hour   Intake 1040 ml   Output 2500 ml   Net -1460 ml       General:  Alert, cooperative, no acute distress    HEENT: PERRLA, anicteric sclerae.  Pulmonary:  CTA Bilaterally. No Wheezing/Rales.  Cardiovascular: Regular rate and Rhythm.  GI:  Soft, Non distended, Non tender. + Bowel sounds.  Extremities:  No edema. No calf tenderness.   Psych: Not anxious or agitated.  Neurologic: Alert awake, receptive aphasia, follows commands inconsistently, moves left side better than right  side.  Additional:    Medications:   Current Facility-Administered Medications   Medication Dose Route Frequency    lactated Ringers infusion  125 mL/hr IntraVENous CONTINUOUS    amLODIPine (NORVASC) tablet 10 mg  10 mg Oral DAILY    insulin lispro (HUMALOG) injection   SubCUTAneous AC&HS    glucose chewable tablet 16 g  4 Tablet Oral PRN    glucagon (GLUCAGEN) injection 1 mg  1 mg IntraMUSCular PRN    dextrose 10% infusion 0-250 mL  0-250 mL IntraVENous PRN    pantoprazole (PROTONIX) tablet 40 mg  40 mg Oral ACB    rosuvastatin (CRESTOR) tablet 10 mg  10 mg Oral QHS    sodium chloride (NS) flush 5-40 mL  5-40 mL IntraVENous Q8H    sodium chloride (NS) flush 5-40 mL  5-40 mL IntraVENous PRN    acetaminophen (TYLENOL) tablet 650 mg  650 mg Oral Q6H PRN    Or    acetaminophen (TYLENOL) suppository 650 mg  650 mg Rectal Q6H PRN    polyethylene glycol (MIRALAX) packet 17 g  17 g Oral DAILY PRN    ondansetron (ZOFRAN ODT) tablet 4 mg  4 mg Oral Q8H PRN    Or    ondansetron (ZOFRAN) injection 4 mg  4 mg IntraVENous Q6H PRN    metoprolol tartrate (LOPRESSOR) tablet 50 mg  50 mg Oral BID    levETIRAcetam (KEPPRA) 500 mg in 0.9% sodium chloride (MBP/ADV) 100 mL MBP  500 mg IntraVENous Q12H    hydrALAZINE (APRESOLINE) tablet 50 mg  50 mg Oral TID    escitalopram oxalate (LEXAPRO) tablet 5 mg  5 mg Oral QPM    thiamine HCL (B-1) tablet 100 mg  100 mg Oral DAILY    cyanocobalamin (VITAMIN B12) sublingual tablet 2,500 mcg  2,500 mcg Oral DAILY    cefepime (MAXIPIME) 1 g in 0.9% sodium chloride (MBP/ADV) 50 mL MBP  1 g IntraVENous Q24H       Labs:    Recent Results (from the past 24 hour(s))   GLUCOSE, POC    Collection Time: 04/05/21  4:11 PM   Result Value Ref Range    Glucose (POC) 166 (H) 70 - 110 mg/dL   GLUCOSE, POC    Collection Time: 04/05/21  9:16 PM   Result Value Ref Range    Glucose (POC) 98 70 - 110 mg/dL   METABOLIC PANEL, BASIC  Collection Time: 04/06/21  2:12 AM   Result Value Ref Range    Sodium 142 136 - 145  mmol/L    Potassium 4.1 3.5 - 5.5 mmol/L    Chloride 113 (H) 100 - 111 mmol/L    CO2 23 21 - 32 mmol/L    Anion gap 6 3.0 - 18 mmol/L    Glucose 113 (H) 74 - 99 mg/dL    BUN 79 (H) 7.0 - 18 MG/DL    Creatinine 7.82 (H) 0.6 - 1.3 MG/DL    BUN/Creatinine ratio 10 (L) 12 - 20      GFR est AA 9 (L) >60 ml/min/1.12m2    GFR est non-AA 7 (L) >60 ml/min/1.40m2    Calcium 8.3 (L) 8.5 - 10.1 MG/DL   CBC WITH AUTOMATED DIFF    Collection Time: 04/06/21  2:12 AM   Result Value Ref Range    WBC 21.3 (H) 4.6 - 13.2 K/uL    RBC 2.90 (L) 4.35 - 5.65 M/uL    HGB 9.2 (L) 13.0 - 16.0 g/dL    HCT 42.3 (L) 53.6 - 48.0 %    MCV 93.4 78.0 - 100.0 FL    MCH 31.7 24.0 - 34.0 PG    MCHC 33.9 31.0 - 37.0 g/dL    RDW 14.4 31.5 - 40.0 %    PLATELET 547 (H) 135 - 420 K/uL    MPV 9.5 9.2 - 11.8 FL    NRBC 0.1 (H) 0 PER 100 WBC    ABSOLUTE NRBC 0.02 (H) 0.00 - 0.01 K/uL    NEUTROPHILS 78 (H) 40 - 73 %    BAND NEUTROPHILS 1 %    LYMPHOCYTES 16 (L) 21 - 52 %    MONOCYTES 1 (L) 3 - 10 %    EOSINOPHILS 3 0 - 5 %    BASOPHILS 0 0 - 2 %    MYELOCYTES 1 %    IMMATURE GRANULOCYTES 0 0.0 - 0.5 %    ABS. NEUTROPHILS 16.8 (H) 1.8 - 8.0 K/UL    ABS. LYMPHOCYTES 3.4 0.9 - 3.6 K/UL    ABS. MONOCYTES 0.2 0.05 - 1.2 K/UL    ABS. EOSINOPHILS 0.6 (H) 0.0 - 0.4 K/UL    ABS. BASOPHILS 0.0 0.0 - 0.1 K/UL    ABS. IMM. GRANS. 0.0 0.00 - 0.04 K/UL    DF MANUAL      PLATELET COMMENTS Increased Platelets      RBC COMMENTS ANISOCYTOSIS  1+        RBC COMMENTS POLYCHROMASIA  1+       GLUCOSE, POC    Collection Time: 04/06/21  8:22 AM   Result Value Ref Range    Glucose (POC) 118 (H) 70 - 110 mg/dL   GLUCOSE, POC    Collection Time: 04/06/21 12:31 PM   Result Value Ref Range    Glucose (POC) 99 70 - 110 mg/dL       Signed By: Woodward Ku, MD     April 06, 2021      Disclaimer: Sections of this note are dictated using utilizing voice recognition software.  Minor typographical errors may be present. If questions arise, please do not hesitate to contact me or  call our department.

## 2021-04-06 NOTE — Progress Notes (Signed)
Progress Notes by Georgiann Cocker, MD at 04/06/21 1433                Author: Georgiann Cocker, MD  Service: Nephrology  Author Type: Physician       Filed: 04/06/21 1436  Date of Service: 04/06/21 1433  Status: Signed          Editor: Georgiann Cocker, MD (Physician)                       In Patient Progress note         Admit Date: 04/03/2021        Impression:        1) AKI on CKD 4 ( baseline 3-4 creatinine ) d/t ATN in setting of sepsis    , some obst component causing AKI as well, renal functions improving    2) Concern for severe Septis ,Urosepsis d/t gm -ve rods urine cx    3) S/p cysto with stent placed   4) Left mild hydro with UPJ stone ,Urology following    5) Metabolic acidoses improving    6) Hypernatremia better   7) Altered mental status    8) h/o intracranial hemorrhage        Plan:   1) strict intake/output    2) LR @ 125 cc/hrs   3) avoid IV contrast , nephrotoxins   4) renally dose AB    5) supportive care per primary team        discussed with nursing/primary team,       Please call with questions,       Robyne Askew, MD FASN   Cell [phone]   Pager: [phone]          Subjective:        - sleeping    - No acute over night events.   - respiratory - stable   - hemodynamics - stable, no pressrs   - UOP-ok   - Nutrition -ok        Objective:        Visit Vitals      BP  (!) 171/81 (BP 1 Location: Right upper arm, BP Patient Position: At rest)     Pulse  86     Temp  98.4 ??F (36.9 ??C)     Resp  20     Ht  5\' 3"  (1.6 m)     Wt  110.2 kg (243 lb)     SpO2  91%        BMI  43.05 kg/m??              Intake/Output Summary (Last 24 hours) at 04/06/2021 1433   Last data filed at 04/06/2021 1348     Gross per 24 hour        Intake  1040 ml        Output  2500 ml        Net  -1460 ml             Physical Exam:        Patient is in no apparent distress.    HEENT: mmm   Neck: no cervical lymphadenopathy or thyromegaly.    Lungs: good air entry, clear to auscultation bilaterally.    Cardiovascular  system: S1, S2, regular rate and rhythm.    Abdomen: soft, non tender, non distended.     Extremities: no clubbing, cyanosis or edema.  Integumentary: skin is grossly intact.              Data Review:        Recent Labs           04/06/21   0212     WBC  21.3*     RBC  2.90*     HCT  27.1*     MCV  93.4     MCH  31.7     MCHC  33.9        RDW  12.1          Recent Labs              04/06/21   0212  04/05/21   0216  04/04/21   0140  04/03/21   1559     BUN  79*  89*  98*  104*     CREA  7.75*  8.81*  9.42*  9.73*     CA  8.3*  8.8  8.4*  9.1   9.2     ALB   --    --    --   2.3*     K  4.1  4.8  5.2  4.7     NA  142  144  146*  146*     CL  113*  117*  116*  117*     CO2  23  18*  19*  17*           GLU  113*  88  125*  97           Georgiann Cocker, MD

## 2021-04-06 NOTE — Progress Notes (Signed)
Problem: Pressure Injury - Risk of  Goal: *Prevention of pressure injury  Description: Document Braden Scale and appropriate interventions in the flowsheet.  Outcome: Progressing Towards Goal  Note: Pressure Injury Interventions:  Sensory Interventions: Assess changes in LOC, Check visual cues for pain, Minimize linen layers    Moisture Interventions: Absorbent underpads, Internal/External urinary devices, Minimize layers    Activity Interventions: Pressure redistribution bed/mattress(bed type), PT/OT evaluation    Mobility Interventions: Pressure redistribution bed/mattress (bed type), PT/OT evaluation    Nutrition Interventions: Document food/fluid/supplement intake    Friction and Shear Interventions: Lift sheet                Problem: Pain  Goal: *Control of Pain  Outcome: Progressing Towards Goal

## 2021-04-07 LAB — RENAL FUNCTION PANEL
Albumin: 2.2 g/dL — ABNORMAL LOW (ref 3.4–5.0)
Albumin: 2.2 g/dL — ABNORMAL LOW (ref 3.4–5.0)
Anion Gap: 6 mmol/L (ref 3.0–18)
Anion gap: 6 mmol/L (ref 3.0–18)
BUN/Creatinine ratio: 9 — ABNORMAL LOW (ref 12–20)
BUN: 64 MG/DL — ABNORMAL HIGH (ref 7.0–18)
BUN: 64 MG/DL — ABNORMAL HIGH (ref 7.0–18)
Bun/Cre Ratio: 9 — ABNORMAL LOW (ref 12–20)
CO2: 26 mmol/L (ref 21–32)
CO2: 26 mmol/L (ref 21–32)
Calcium: 8.8 MG/DL (ref 8.5–10.1)
Calcium: 8.8 MG/DL (ref 8.5–10.1)
Chloride: 113 mmol/L — ABNORMAL HIGH (ref 100–111)
Chloride: 113 mmol/L — ABNORMAL HIGH (ref 100–111)
Creatinine: 6.82 MG/DL — ABNORMAL HIGH (ref 0.6–1.3)
Creatinine: 6.82 MG/DL — ABNORMAL HIGH (ref 0.6–1.3)
EGFR IF NonAfrican American: 9 mL/min/{1.73_m2} — ABNORMAL LOW (ref >60)
GFR African American: 10 mL/min/{1.73_m2} — ABNORMAL LOW (ref >60)
GFR est AA: 10 mL/min/{1.73_m2} — ABNORMAL LOW (ref >60)
GFR est non-AA: 9 mL/min/{1.73_m2} — ABNORMAL LOW (ref >60)
Glucose: 87 mg/dL (ref 74–99)
Glucose: 87 mg/dL (ref 74–99)
Phosphorus: 4.4 MG/DL (ref 2.5–4.9)
Phosphorus: 4.4 MG/DL (ref 2.5–4.9)
Potassium: 4.3 mmol/L (ref 3.5–5.5)
Potassium: 4.3 mmol/L (ref 3.5–5.5)
Sodium: 145 mmol/L (ref 136–145)
Sodium: 145 mmol/L (ref 136–145)

## 2021-04-07 LAB — POCT GLUCOSE
POC Glucose: 137 mg/dL — ABNORMAL HIGH (ref 70–110)
POC Glucose: 86 mg/dL (ref 70–110)
POC Glucose: 100 mg/dL (ref 70–110)

## 2021-04-07 LAB — GLUCOSE, POC
Glucose (POC): 137 mg/dL — ABNORMAL HIGH (ref 70–110)
Glucose (POC): 86 mg/dL (ref 70–110)
Glucose (POC): 100 mg/dL (ref 70–110)

## 2021-04-07 MED ORDER — WATER FOR INJECTION, STERILE INJECTION
2 gram | INTRAMUSCULAR | Status: DC
Start: 2021-04-07 — End: 2021-04-11
  Administered 2021-04-07 – 2021-04-10 (×4): via INTRAVENOUS

## 2021-04-07 MED FILL — ROSUVASTATIN 10 MG TAB: 10 mg | ORAL | Qty: 1

## 2021-04-07 MED FILL — CEFTRIAXONE 2 GRAM SOLUTION FOR INJECTION: 2 gram | INTRAMUSCULAR | Qty: 2

## 2021-04-07 MED FILL — ESCITALOPRAM 10 MG TAB: 10 mg | ORAL | Qty: 1

## 2021-04-07 MED FILL — HYDRALAZINE 50 MG TAB: 50 mg | ORAL | Qty: 2

## 2021-04-07 MED FILL — THIAMINE HCL 100 MG TAB: 100 mg | ORAL | Qty: 1

## 2021-04-07 MED FILL — CYANOCOBALAMIN (VITAMIN B-12) 2,500 MCG SUBLINGUAL TAB: 2500 mcg | SUBLINGUAL | Qty: 1

## 2021-04-07 MED FILL — LEVETIRACETAM 500 MG/5 ML IV SOLN: 500 mg/5 mL | INTRAVENOUS | Qty: 5

## 2021-04-07 MED FILL — PANTOPRAZOLE 40 MG TAB, DELAYED RELEASE: 40 mg | ORAL | Qty: 1

## 2021-04-07 MED FILL — CEFEPIME 1 GRAM SOLUTION FOR INJECTION: 1 gram | INTRAMUSCULAR | Qty: 1

## 2021-04-07 MED FILL — METOPROLOL TARTRATE 50 MG TAB: 50 mg | ORAL | Qty: 1

## 2021-04-07 MED FILL — AMLODIPINE 10 MG TAB: 10 mg | ORAL | Qty: 1

## 2021-04-07 NOTE — Progress Notes (Signed)
Problem: Pressure Injury - Risk of  Goal: *Prevention of pressure injury  Description: Document Braden Scale and appropriate interventions in the flowsheet.  Outcome: Progressing Towards Goal  Note: Pressure Injury Interventions:  Sensory Interventions: Assess changes in LOC, Check visual cues for pain, Minimize linen layers    Moisture Interventions: Absorbent underpads, Apply protective barrier, creams and emollients    Activity Interventions: Pressure redistribution bed/mattress(bed type)    Mobility Interventions: Pressure redistribution bed/mattress (bed type)    Nutrition Interventions: Document food/fluid/supplement intake    Friction and Shear Interventions: Lift sheet, Lift team/patient mobility team                Problem: Falls - Risk of  Goal: *Absence of Falls  Description: Document Schmid Fall Risk and appropriate interventions in the flowsheet.  Outcome: Progressing Towards Goal  Note: Fall Risk Interventions:  Mobility Interventions: Bed/chair exit alarm, Patient to call before getting OOB    Mentation Interventions: Bed/chair exit alarm    Medication Interventions: Bed/chair exit alarm    Elimination Interventions: Call light in reach, Patient to call for help with toileting needs              Problem: Urinary Elimination - Impaired  Goal: *Absence/decrease in episodes of  urinary incontinence  Outcome: Progressing Towards Goal     Problem: Pain  Goal: *Control of Pain  Outcome: Progressing Towards Goal

## 2021-04-07 NOTE — Consults (Signed)
Infectious Disease Consultation Note        Reason: sepsis, complicated UTI    Current abx Prior abx   Cefepime, vancomycin since 8/26 Ceftriaxone 8/25     Lines:       Assessment :    50 y.o. male with a PMHx of Intracranial Hemorrhage, CKD stage 4, metabolic encephalopathy, depression, HTN, GERD, hyperlipidemia, seizure disorder, T2DM and Vit D deficiency who presented to [facility] from [facility] on 04/03/21 for evaluation of complicated urinary tract infection.    Clinical presentation consistent with sepsis-present on admission due to complicated UTI, left kidney pyelonephritis/hydronephrosis    Status post cystoscopy, left ureteral stent placement on 04/03/2021  Intra-Op cultures 8/25-E. Coli    Gradually improving leukocytosis.  Persistent left CVA tenderness    Altered mentation-likely metabolic encephalopathy.    Acute on chronic kidney disease-likely due to obstructive uropathy, sepsis    Antibiotic management complicated due to documented history of penicillin allergy-currently tolerating cefepime without difficulties    Recommendations:    Discontinue cefepime, vancomycin.  Start ceftriaxone  Follow-up urology recommendations  Monitor CBC, temperature, clinically to determine timing of switch to oral antibiotics    Thank you for consultation request. Above plan was discussed in details with patient,  and dr Margo Aye. Please call me if any further questions or concerns. Will continue to participate in the care of this patient.  HPI:    50 y.o. male with a PMHx of Intracranial Hemorrhage, CKD stage 4, metabolic encephalopathy, depression, HTN, GERD, hyperlipidemia, seizure disorder, T2DM and Vit D deficiency who presented to [facility] from [facility] on 04/03/21 for evaluation of complicated urinary tract infection.      Pt was sent to [facility] hospital from  [facility] where he resided after having Intracranial Hemorrhage on July 18, 22. Pt was found to be unresponsive at the nursing home and subsequently sent to the  ED. Pt was not cooperative and was difficult to arouse so further work-up was done. At the ED pt had a positive UA, CXR negative for infiltrates, Covid negative. CT Head revealed no new hemorrhage, CT Abd and Pelvis revealed mild hydronephrosis due to left ureter stone. Furthermore, most recent blood work shows WBC 25.75, Hgb 10.9, Hct 32.2, Plt 553, K 5.9, BUN 113, Cr 10.13, Albumin 2.2. EKG NSR. Pt was transferred to [facility] for further evaluation and management.  Was initiated on p.o. antibiotics.  Was evaluated by urology.  Status post cystoscopy, left ureteral stent placement on 04/03/2021.  Patient continues to have persistent leukocytosis.  I have been consulted for further recommendations.    Patient is very sleepy and unable to answer questions asked.  Detailed review of system not feasible      Past Medical History:   Diagnosis Date    CKD (chronic kidney disease) stage 4, GFR 15-29 ml/min (HCC) 04/03/2021    Depression 04/03/2021    Essential hypertension 04/03/2021    GERD without esophagitis 04/03/2021    Intracerebral hemorrhage (HCC) 02/24/2021    Intracerebral hemorrhage, nontraumatic (HCC)     Metabolic encephalopathy 04/03/2021    Mixed hyperlipidemia 04/03/2021    Obesity 04/03/2021    Seizure disorder (HCC) 04/03/2021    Type 2 diabetes mellitus (HCC) 04/03/2021    Vitamin D deficiency 04/03/2021       History reviewed. No pertinent surgical history.    home Medication List         Details   hydrALAZINE (APRESOLINE) 50 mg tablet Take 50 mg by  mouth three (3) times daily.      amLODIPine (NORVASC) 10 mg tablet Take 1 Tablet by mouth in the morning.  Qty: 30 Tablet, Refills: 0      cyanocobalamin (VITAMIN B12) 2,500 mcg sublingual tablet Take 1 Tablet by mouth in the morning.  Qty: 30 Tablet, Refills: 0      ergocalciferol (ERGOCALCIFEROL) 1,250 mcg (50,000 unit) capsule Take 1 Capsule by mouth every seven (7) days.  Qty: 4 Capsule, Refills: 0      escitalopram oxalate (LEXAPRO) 5 mg tablet Take 1 Tablet by mouth  every evening.  Qty: 30 Tablet, Refills: 0      levETIRAcetam (KEPPRA) 500 mg tablet Take 1 Tablet by mouth two (2) times a day.  Qty: 60 Tablet, Refills: 0      metoprolol tartrate (LOPRESSOR) 50 mg tablet Take 1 Tablet by mouth two (2) times a day.  Qty: 60 Tablet, Refills: 0      pantoprazole (PROTONIX) 40 mg tablet Take 1 Tablet by mouth Daily (before breakfast).  Qty: 30 Tablet, Refills: 0      rosuvastatin (CRESTOR) 10 mg tablet Take 1 Tablet by mouth nightly.  Qty: 30 Tablet, Refills: 0      thiamine HCL (B-1) 100 mg tablet Take 1 Tablet by mouth in the morning.  Qty: 30 Tablet, Refills: 0             Current Facility-Administered Medications   Medication Dose Route Frequency    hydrALAZINE (APRESOLINE) tablet 100 mg  100 mg Oral TID    amLODIPine (NORVASC) tablet 10 mg  10 mg Oral DAILY    insulin lispro (HUMALOG) injection   SubCUTAneous AC&HS    glucose chewable tablet 16 g  4 Tablet Oral PRN    glucagon (GLUCAGEN) injection 1 mg  1 mg IntraMUSCular PRN    dextrose 10% infusion 0-250 mL  0-250 mL IntraVENous PRN    pantoprazole (PROTONIX) tablet 40 mg  40 mg Oral ACB    rosuvastatin (CRESTOR) tablet 10 mg  10 mg Oral QHS    sodium chloride (NS) flush 5-40 mL  5-40 mL IntraVENous Q8H    sodium chloride (NS) flush 5-40 mL  5-40 mL IntraVENous PRN    acetaminophen (TYLENOL) tablet 650 mg  650 mg Oral Q6H PRN    Or    acetaminophen (TYLENOL) suppository 650 mg  650 mg Rectal Q6H PRN    polyethylene glycol (MIRALAX) packet 17 g  17 g Oral DAILY PRN    ondansetron (ZOFRAN ODT) tablet 4 mg  4 mg Oral Q8H PRN    Or    ondansetron (ZOFRAN) injection 4 mg  4 mg IntraVENous Q6H PRN    metoprolol tartrate (LOPRESSOR) tablet 50 mg  50 mg Oral BID    levETIRAcetam (KEPPRA) 500 mg in 0.9% sodium chloride (MBP/ADV) 100 mL MBP  500 mg IntraVENous Q12H    escitalopram oxalate (LEXAPRO) tablet 5 mg  5 mg Oral QPM    thiamine HCL (B-1) tablet 100 mg  100 mg Oral DAILY    cyanocobalamin (VITAMIN B12) sublingual tablet 2,500  mcg  2,500 mcg Oral DAILY    cefepime (MAXIPIME) 1 g in 0.9% sodium chloride (MBP/ADV) 50 mL MBP  1 g IntraVENous Q24H       Allergies: Amoxicillin and Penicillin g    History reviewed. No pertinent family history.  Social History     Socioeconomic History    Marital status: SINGLE     Spouse  name: Not on file    Number of children: Not on file    Years of education: Not on file    Highest education level: Not on file   Occupational History    Not on file   Tobacco Use    Smoking status: Not on file    Smokeless tobacco: Not on file   Substance and Sexual Activity    Alcohol use: Not on file    Drug use: Not on file    Sexual activity: Not on file   Other Topics Concern    Not on file   Social History Narrative    Not on file     Social Determinants of Health     Financial Resource Strain: Not on file   Food Insecurity: Not on file   Transportation Needs: Not on file   Physical Activity: Not on file   Stress: Not on file   Social Connections: Not on file   Intimate Partner Violence: Not on file   Housing Stability: Not on file     Social History     Tobacco Use   Smoking Status Not on file   Smokeless Tobacco Not on file        Temp (24hrs), Avg:98 ??F (36.7 ??C), Min:97.5 ??F (36.4 ??C), Max:98.4 ??F (36.9 ??C)    Visit Vitals  BP (!) 159/92 (BP 1 Location: Right upper arm, BP Patient Position: At rest)   Pulse 75   Temp 97.5 ??F (36.4 ??C)   Resp 18   Ht 5\' 3"  (1.6 m)   Wt 111.1 kg (245 lb)   SpO2 94%   BMI 43.40 kg/m??       ROS: unable to obtain due to patient factors    Physical Exam:    General: Well developed, well nourished male laying on the bed AAOx3 in no acute distress.    General:   awake alert and oriented   HEENT:  Normocephalic, atraumatic,EOMI, no scleral icterus or pallor; no conjunctival hemmohage;  nasal and oral mucous are moist and without evidence of lesions. No thrush. Dentition good. Neck supple, no bruits.   Lymph Nodes:   no cervical, axillary or inguinal adenopathy   Lungs:   non-labored,  bilateral chest movements equal, no audible wheezing   Heart:  RRR, s1 and s2; no  rubs or gallops   Abdomen:  soft, non-distended, active bowel sounds, no hepatomegaly, no splenomegaly. Non-tender   Genitourinary: Foley in place   Extremities:   no clubbing, cyanosis; no joint effusions or swelling; Full ROM of all large joints to the upper and lower extremities; muscle mass appropriate for age   Neurologic:  Does not follow commands.  Moves all 4 extremities.  Detailed neurologic evaluation not feasible                        Skin:  No rash or ulcers noted   Back:  no spinal or paraspinal muscle tenderness or rigidity, left CVA tenderness     Psychiatric:  Unable to assess         Labs: Results:   Chemistry Recent Labs     04/06/21  0212 04/05/21  0216   GLU 113* 88   NA 142 144   K 4.1 4.8   CL 113* 117*   CO2 23 18*   BUN 79* 89*   CREA 7.75* 8.81*   CA 8.3* 8.8   AGAP 6 9  BUCR 10* 10*      CBC w/Diff Recent Labs     04/06/21  0212 04/05/21  0216   WBC 21.3* 22.9*   RBC 2.90* 2.98*   HGB 9.2* 9.5*   HCT 27.1* 28.3*   PLT 547* 545*   GRANS 78*  --    LYMPH 16*  --    EOS 3  --       Microbiology No results for input(s): CULT in the last 72 hours.       RADIOLOGY:    All available imaging studies/reports in connect care for this admission were reviewed      Disclaimer: Sections of this note are dictated utilizing voice recognition software, which may have resulted in some phonetic based errors in grammar and contents. Even though attempts were made to correct all the mistakes, some may have been missed, and remained in the body of the document. If questions arise, please contact our department.    Dr. Raiford Simmonds, Infectious Disease Specialist  [phone]  April 07, 2021  9:01 AM

## 2021-04-07 NOTE — Progress Notes (Signed)
Progress  Notes by Sharlyne Pacas, PA at 04/07/21 1031                Author: Sharlyne Pacas, PA  Service: Urology  Author Type: Physician Assistant       Filed: 04/07/21 1339  Date of Service: 04/07/21 1031  Status: Attested           Editor: Sharlyne Pacas, PA (Physician Assistant)  Cosigner: Gevena Mart, MD at 04/07/21 1348          Attestation signed by Gevena Mart, MD at 04/07/21 1348          I have reviewed pertinent vitals, I/O's, notes, laboratory values and imaging. I have discussed the case and I agree with the provider's assessment and plan  as outlined above, with ammendments as follows:           Gwendel Hanson, MD   [facility] [place] [place]   Pager [phone]                                            Urology Progress Note              Assessment/Plan:          Patient Active Problem List        Diagnosis  Code         ?  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  Obstructive uropathy  N13.9     ?  CKD (chronic kidney disease) stage 4, GFR 15-29 ml/min (HCC)  N18.4     ?  Intracerebral hemorrhage (HCC)  I61.9     ?  Seizure disorder (HCC)  G40.909     ?  Type 2 diabetes mellitus (HCC)  E11.9     ?  Metabolic encephalopathy  G93.41     ?  Depression  F32.A     ?  Essential hypertension  I10     ?  Mixed hyperlipidemia  E78.2     ?  GERD without esophagitis  K21.9     ?  Vitamin D deficiency  E55.9     ?  Obesity  E66.9     ?  Anemia  D64.9     ?  UTI (urinary tract infection)  N39.0         ?  AKI (acute kidney injury) (HCC)  N17.9         ?  Sepsis (HCC)  A41.9           ASSESSMENT:    Mild Left Hydronephrosis 2/2 38mm Left UPJ Stone on CT 04/02/21 from [facility]   UTI   AKI   S/p Cysto, L RGP, L JJ stent placement on 04/03/21 by Dr. Sinclair Grooms    Findings: Pus in Kidney               WBC 21.3< 25.7>24.5   Creat: 7.75<9.42<9.73               Baseline 3.6-4.6             UA 04/02/21: Positive Nitrite, WBC 20-50, RBC 1-2, many bacteria   UCX >100K e.coli and BCX NGTD               PT 13.3, INR 1.1 on 04/02/21        H/o Recent CVA/Hemorrhagic  Stroke       PLAN:     Appreciate overall management per medicine.    Maintain foley until clinically improved.   Continue ABX- Cefepime   Nephrology following.   Patient will need definitive stone surgery outpatient.   Will see Thursday.      Follow up arranged? Yes, intermediate pathway.           Sharlyne Pacas PA-C   [facility]   Available M-Fri, 8AM- 5PM   Pager: [phone]          Subjective:        Daily Progress Note: 04/07/2021 10:47 AM      Thomas Jackson is doing ok. Complains of mid sternal pain and abdominal pain. VSS. WBC and creatinine slowly improving. UOP 3450 cc for past 24 hours.        Objective:        Visit Vitals      BP  (!) 156/89     Pulse  70     Temp  98.1 ??F (36.7 ??C)     Resp  20     Ht  5\' 3"  (1.6 m)     Wt  111.1 kg (245 lb)     SpO2  95%        BMI  43.40 kg/m??            Temp (24hrs), Avg:98 ??F (36.7 ??C), Min:97.5 ??F (36.4 ??C), Max:98.4 ??F (36.9 ??C)         Intake and Output:   08/27 1901 - 08/29 0700   In: 2040 [P.O.:240; I.V.:1800]   Out: 4200 [Urine:4200]   No intake/output data recorded.      PHYSICAL EXAMINATION:    Visit Vitals      BP  (!) 156/89     Pulse  70     Temp  98.1 ??F (36.7 ??C)     Resp  20     Ht  5\' 3"  (1.6 m)     Wt  111.1 kg (245 lb)     SpO2  95%        BMI  43.40 kg/m??        Constitutional: Well developed, well nourished male.  No acute distress.     HEENT: Normocephalic, Atraumatic, EOM's intact    CV:  no edema   Respiratory: No respiratory distress or difficulties breathing    Abdomen:  soft, TTP in LUQ    GU Male:  No CVA tenderness   SCROTUM:  No scrotal rash or lesions noticed.  Normal bilateral testes and epididymis.    PENIS: Urethral meatus buried. Foley in place with clear, yellow urine   Skin: No evidence of jaundice.  Normal color   Neuro/Psych:  sleepy      Lab/Data Review:   All lab results for the last 24 hours reviewed.      Labs:          Labs:  Results:        Chemistry      Recent Labs         04/06/21    0212  04/05/21   0216      GLU  113*  88      NA  142  144      K  4.1  4.8      CL  113*  117*      CO2  23  18*  BUN  79*  89*      CREA  7.75*  8.81*      CA  8.3*  8.8      AGAP  6  9      BUCR  10*  10*                    CBC w/Diff  Recent Labs         04/06/21   0212  04/05/21   0216      WBC  21.3*  22.9*      RBC  2.90*  2.98*      HGB  9.2*  9.5*      HCT  27.1*  28.3*      PLT  547*  545*      GRANS  78*   --       LYMPH  16*   --       EOS  3   --                     Cultures  No results for input(s): CULT in the last 72 hours.      All Micro Results            Procedure  Component  Value  Units  Date/Time        CULTURE, BLOOD [[id number]]  Collected: 04/03/21 2256        Order Status: Completed  Specimen: Blood  Updated: 04/07/21 0709          Special Requests:  NO SPECIAL REQUESTS               Culture result:  NO GROWTH 4 DAYS             CULTURE, URINE [[id number]]  (Abnormal)  (Susceptibility)  Collected: 04/03/21 1958        Order Status: Completed  Specimen: Cystoscopic Urine  Updated: 04/06/21 1509          Special Requests:  LEFT KIDNEY URINE          Colony Count  --               >100,000   COLONIES/mL             Culture result:  ESCHERICHIA COLI               CULTURE, URINE [[id number]]  Collected: 04/03/21 1638        Order Status: Completed  Specimen: Cath Urine  Updated: 04/04/21 1942          Special Requests:  NO SPECIAL REQUESTS               Culture result:  No growth (<1,000 CFU/ML)             CULTURE, MRSA [[id number]]  Collected: 04/03/21 1638        Order Status: Completed  Specimen: Nasal from Nares  Updated: 04/04/21 1823          Special Requests:  NO SPECIAL REQUESTS               Culture result:  MRSA NOT PRESENT                             Screening of patient nares for MRSA is for surveillance purposes and, if positive, to facilitate isolation considerations in high risk  settings.  It is not intended for automatic decolonization interventions per se as regimens are not  sufficiently effective to warrant routine use.           COVID-19 RAPID TEST [[id number]]  Collected: 04/03/21 1845        Order Status: Completed  Specimen: Nasopharyngeal  Updated: 04/03/21 1917          Specimen source  Nasopharyngeal               COVID-19 rapid test  Not detected               Comment:  Rapid Abbott ID Now         Rapid NAAT:  The specimen is NEGATIVE for SARS-CoV-2, the novel coronavirus associated with COVID-19.         Negative results should be treated as presumptive and, if inconsistent with clinical signs and symptoms or necessary for patient management, should be tested with an alternative molecular assay.   Negative results do not preclude SARS-CoV-2 infection and should not be used as the sole basis for patient management decisions.         This test has been authorized by the FDA under an Emergency Use Authorization (EUA) for use by authorized laboratories.    Fact sheet for Healthcare Providers: FlickSafe.gl   Fact sheet for Patients: [place].at         Methodology: Isothermal Nucleic Acid Amplification                                      Urinalysis  Color      Date  Value  Ref Range  Status      04/03/2021  YELLOW     Final         Appearance      Date  Value  Ref Range  Status      04/03/2021  CLOUDY     Final         Specific gravity      Date  Value  Ref Range  Status      04/03/2021  1.011  1.005 - 1.030    Final         pH (UA)      Date  Value  Ref Range  Status      04/03/2021  5.0  5.0 - 8.0    Final         Protein      Date  Value  Ref Range  Status      04/03/2021  300 (A)  NEG mg/dL  Final         Ketone      Date  Value  Ref Range  Status      04/03/2021  Negative  NEG mg/dL  Final         Bilirubin      Date  Value  Ref Range  Status      04/03/2021  Negative  NEG    Final         Blood      Date  Value  Ref Range  Status      04/03/2021  Negative  NEG    Final         Urobilinogen      Date  Value  Ref Range   Status      04/03/2021  0.2  0.2 - 1.0 EU/dL  Final         Nitrites      Date  Value  Ref Range  Status      04/03/2021  Negative  NEG    Final         Leukocyte Esterase      Date  Value  Ref Range  Status      04/03/2021  MODERATE (A)  NEG    Final         Potassium      Date  Value  Ref Range  Status      04/06/2021  4.1  3.5 - 5.5 mmol/L  Final         Creatinine      Date  Value  Ref Range  Status      04/06/2021  7.75 (H)  0.6 - 1.3 MG/DL  Final         BUN      Date  Value  Ref Range  Status      04/06/2021  79 (H)  7.0 - 18 MG/DL  Final              PSA  No results for input(s): PSA in the last 72 hours.        Coagulation  Lab Results      Component  Value  Date/Time        Prothrombin time  12.1  02/24/2021 06:18 PM        INR  1.0  02/24/2021 06:18 PM                     I discussed the case and independently reviewed the pertinent labs and imaging. I agree with the findings and plan of care, as documented in the note above.      Collene MaresSamay Sappal, MD   [facility] [place]

## 2021-04-07 NOTE — Progress Notes (Signed)
Progress Notes by Georgiann Cocker, MD at 04/07/21 1609                Author: Georgiann Cocker, MD  Service: Nephrology  Author Type: Physician       Filed: 04/07/21 1610  Date of Service: 04/07/21 1609  Status: Signed          Editor: Georgiann Cocker, MD (Physician)                       In Patient Progress note         Admit Date: 04/03/2021        Impression:        1) AKI on CKD 4 ( baseline 3-4 creatinine ) d/t ATN in setting of sepsis    , some obst component causing AKI as well, renal functions improving    2) Concern for severe Septis ,Urosepsis d/t gm -ve rods urine cx    3) S/p cysto with stent placed   4) Left mild hydro with UPJ stone ,Urology following    5) Metabolic acidoses improving    6) Hypernatremia better   7) Altered mental status    8) h/o intracranial hemorrhage        Plan:   1) strict intake/output    2) continue LR @ 100 cc/hrs   3) avoid IV contrast , nephrotoxins   4) renally dose AB    5) supportive care per primary team        discussed with nursing/primary team, Dr Margo Aye       Please call with questions,       Robyne Askew, MD FASN   Cell [phone]   Pager: [phone]          Subjective:        - sleeping    - No acute over night events.   - respiratory - stable   - hemodynamics - stable, no pressrs   - UOP-ok   - Nutrition -ok        Objective:        Visit Vitals      BP  (!) 156/89     Pulse  70     Temp  98.1 ??F (36.7 ??C)     Resp  20     Ht  5\' 3"  (1.6 m)     Wt  111.1 kg (245 lb)     SpO2  95%        BMI  43.40 kg/m??              Intake/Output Summary (Last 24 hours) at 04/07/2021 1609   Last data filed at 04/07/2021 0324     Gross per 24 hour        Intake  1120 ml        Output  2000 ml        Net  -880 ml                Physical Exam:        Patient is in no apparent distress.    HEENT: mmm   Neck: no cervical lymphadenopathy or thyromegaly.    Lungs: good air entry, clear to auscultation bilaterally.    Cardiovascular system: S1, S2, regular rate and rhythm.     Abdomen: soft, non tender, non distended.     Extremities: no clubbing, cyanosis or edema.    Integumentary: skin is grossly  intact.              Data Review:        Recent Labs           04/06/21   0212     WBC  21.3*     RBC  2.90*     HCT  27.1*     MCV  93.4     MCH  31.7     MCHC  33.9        RDW  12.1             Recent Labs             04/07/21   1000  04/06/21   0212  04/05/21   0216     BUN  64*  79*  89*     CREA  6.82*  7.75*  8.81*     CA  8.8  8.3*  8.8     ALB  2.2*   --    --      K  4.3  4.1  4.8     NA  145  142  144     CL  113*  113*  117*     CO2  26  23  18*     PHOS  4.4   --    --           GLU  87  113*  88              Georgiann Cocker, MD

## 2021-04-07 NOTE — Progress Notes (Signed)
[facility]  Progress Note    Patient: Thomas Jackson Age: 50 y.o. DOB: Jun 29, 1971 MR#: [id number] SSN: [id number]  Date/Time: 04/07/2021     Subjective: Patient lying in bed. Sleeping. Will awoken and intermittently answer questions. Denies pain.       Assessment/Plan:     Hospital Problems  Date Reviewed: 05-01-21            Codes Class Noted POA    Obstructive uropathy ICD-10-CM: N13.9  ICD-9-CM: 599.60  05/01/2021 Unknown        CKD (chronic kidney disease) stage 4, GFR 15-29 ml/min (HCC) ICD-10-CM: N18.4  ICD-9-CM: 585.4  May 01, 2021 Yes        Seizure disorder (HCC) ICD-10-CM: G40.909  ICD-9-CM: 345.90  05-01-21 Yes        Type 2 diabetes mellitus (HCC) ICD-10-CM: E11.9  ICD-9-CM: 250.00  2021-05-01 Yes        Metabolic encephalopathy ICD-10-CM: G93.41  ICD-9-CM: 348.31  2021-05-01 Yes        Depression ICD-10-CM: F32.A  ICD-9-CM: 311  May 01, 2021 Yes        Essential hypertension ICD-10-CM: I10  ICD-9-CM: 401.9  2021-05-01 Yes        Mixed hyperlipidemia ICD-10-CM: E78.2  ICD-9-CM: 272.2  May 01, 2021 Yes        GERD without esophagitis ICD-10-CM: K21.9  ICD-9-CM: 530.81  2021/05/01 Yes        Vitamin D deficiency ICD-10-CM: E55.9  ICD-9-CM: 268.9  05-01-21 Yes        Obesity ICD-10-CM: E66.9  ICD-9-CM: 278.00  05/01/2021 Yes        Anemia ICD-10-CM: D64.9  ICD-9-CM: 285.9  05/01/2021 Yes        UTI (urinary tract infection) ICD-10-CM: N39.0  ICD-9-CM: 599.0  May 01, 2021 Yes        AKI (acute kidney injury) (HCC) ICD-10-CM: N17.9  ICD-9-CM: 584.9  May 01, 2021 Yes        Sepsis (HCC) ICD-10-CM: A41.9  ICD-9-CM: 038.9, 995.91  01-May-2021 Yes        Intracerebral hemorrhage (HCC) ICD-10-CM: I61.9  ICD-9-CM: 431  02/24/2021 Yes       Sepsis POA (HR 102, WBC 24K, complicated UTI)  Complicated UTI  Obstructive uropathy, left renal stone with hydronephrosis.  Status post cystoscopy, stent placement by urology  AKI on CKD 4,   Metabolic acidosis, improved  Uremia  Hypernatremia, decreased free  water intake  Hypertension  Leukocytosis, trending down  Thrombocytosis, reactive  Normocytic anemia  History of intracranial hemorrhage, stable  Acute metabolic encephalopathy due to multifactorial including sepsis, uremia, hypernatremia,  Seizure disorder, stable  Type 2 diabetes mellitus      Plan:  Urine culture growing gram-negative rods, will continue cefepime, discontinue vancomycin.  Will consult ID  Further plan per urology for definitive stone management  We will continue hydralazine, metoprolol and resume amlodipine, adjust based on the blood pressure  Continue IV fluids, off bicarb drip per nephrology  No need for HD currently  Continue Keppra  Continue PPI  PT/OT eval and treatment, recommend SNF placement    Discussed with the patient  Called and left a message to fianc??      Case discussed with:  [x] Patient  [x] Family  [x] Nursing  [] Case Management  DVT Prophylaxis:  [] Lovenox  [] Hep SQ  [x] SCDs  [] Coumadin   [] Eliquis/Xarelto     Objective:   VS: Visit Vitals  BP (!) 143/78 (BP 1 Location: Right upper arm, BP Patient Position: At rest)  Pulse 71   Temp 98.1 ??F (36.7 ??C)   Resp 20   Ht 5\' 3"  (1.6 m)   Wt 111.1 kg (245 lb)   SpO2 93%   BMI 43.40 kg/m??        Tmax/24hrs: Temp (24hrs), Avg:98 ??F (36.7 ??C), Min:97.5 ??F (36.4 ??C), Max:98.4 ??F (36.9 ??C)  IOBRIEF  Intake/Output Summary (Last 24 hours) at 04/07/2021 2243  Last data filed at 04/07/2021 1850  Gross per 24 hour   Intake 240 ml   Output 2300 ml   Net -2060 ml         General:  Alert, cooperative, no acute distress    HEENT: PERRLA, anicteric sclerae.  Pulmonary:  CTA Bilaterally. No Wheezing/Rales.  Cardiovascular: Regular rate and Rhythm.  GI:  Soft, Non distended, Non tender. + Bowel sounds.  Extremities:  No edema. No calf tenderness.   Psych: Not anxious or agitated.  Neurologic: Alert awake, receptive aphasia, follows commands inconsistently, moves left side better than right side.  Additional:    Medications:   Current Facility-Administered  Medications   Medication Dose Route Frequency    cefTRIAXone (ROCEPHIN) 2 g in sterile water (preservative free) 20 mL IV syringe  2 g IntraVENous Q24H    hydrALAZINE (APRESOLINE) tablet 100 mg  100 mg Oral TID    amLODIPine (NORVASC) tablet 10 mg  10 mg Oral DAILY    insulin lispro (HUMALOG) injection   SubCUTAneous AC&HS    glucose chewable tablet 16 g  4 Tablet Oral PRN    glucagon (GLUCAGEN) injection 1 mg  1 mg IntraMUSCular PRN    dextrose 10% infusion 0-250 mL  0-250 mL IntraVENous PRN    pantoprazole (PROTONIX) tablet 40 mg  40 mg Oral ACB    rosuvastatin (CRESTOR) tablet 10 mg  10 mg Oral QHS    sodium chloride (NS) flush 5-40 mL  5-40 mL IntraVENous Q8H    sodium chloride (NS) flush 5-40 mL  5-40 mL IntraVENous PRN    acetaminophen (TYLENOL) tablet 650 mg  650 mg Oral Q6H PRN    Or    acetaminophen (TYLENOL) suppository 650 mg  650 mg Rectal Q6H PRN    polyethylene glycol (MIRALAX) packet 17 g  17 g Oral DAILY PRN    ondansetron (ZOFRAN ODT) tablet 4 mg  4 mg Oral Q8H PRN    Or    ondansetron (ZOFRAN) injection 4 mg  4 mg IntraVENous Q6H PRN    metoprolol tartrate (LOPRESSOR) tablet 50 mg  50 mg Oral BID    levETIRAcetam (KEPPRA) 500 mg in 0.9% sodium chloride (MBP/ADV) 100 mL MBP  500 mg IntraVENous Q12H    escitalopram oxalate (LEXAPRO) tablet 5 mg  5 mg Oral QPM    thiamine HCL (B-1) tablet 100 mg  100 mg Oral DAILY    cyanocobalamin (VITAMIN B12) sublingual tablet 2,500 mcg  2,500 mcg Oral DAILY       Labs:    Recent Results (from the past 24 hour(s))   GLUCOSE, POC    Collection Time: 04/07/21  8:46 AM   Result Value Ref Range    Glucose (POC) 86 70 - 110 mg/dL   RENAL FUNCTION PANEL    Collection Time: 04/07/21 10:00 AM   Result Value Ref Range    Sodium 145 136 - 145 mmol/L    Potassium 4.3 3.5 - 5.5 mmol/L    Chloride 113 (H) 100 - 111 mmol/L    CO2 26 21 - 32  mmol/L    Anion gap 6 3.0 - 18 mmol/L    Glucose 87 74 - 99 mg/dL    BUN 64 (H) 7.0 - 18 MG/DL    Creatinine 6.52 (H) 0.6 - 1.3 MG/DL     BUN/Creatinine ratio 9 (L) 12 - 20      GFR est AA 10 (L) >60 ml/min/1.76m2    GFR est non-AA 9 (L) >60 ml/min/1.75m2    Calcium 8.8 8.5 - 10.1 MG/DL    Phosphorus 4.4 2.5 - 4.9 MG/DL    Albumin 2.2 (L) 3.4 - 5.0 g/dL   GLUCOSE, POC    Collection Time: 04/07/21  4:05 PM   Result Value Ref Range    Glucose (POC) 100 70 - 110 mg/dL   GLUCOSE, POC    Collection Time: 04/07/21 10:03 PM   Result Value Ref Range    Glucose (POC) 100 70 - 110 mg/dL       Signed By: Henrietta Hoover, DO     April 07, 2021      Disclaimer: Sections of this note are dictated using utilizing voice recognition software.  Minor typographical errors may be present. If questions arise, please do not hesitate to contact me or call our department.

## 2021-04-07 NOTE — Progress Notes (Signed)
Problem: Dysphagia (Adult)  Goal: *Acute Goals and Plan of Care (Insert Text)  Description:   Patient will:  1. Tolerate PO trials with 0 s/s overt distress in 4/5 trials  2. Utilize compensatory swallow strategies/maneuvers (decrease bite/sip, size/rate, alt. liq/sol) with min cues in 4/5 trials  3. Perform oral-motor/laryngeal exercises to increase oropharyngeal swallow function with min cues  4. Complete an objective swallow study (i.e., MBSS) to assess swallow integrity, r/o aspiration, and determine of safest LRD, min A  5. Participate in Speech/language eval to maximize functional communication in current setting    Rec:     Soft/bite sized with thin liquids   Aspiration precautions  HOB >45 during po intake, remain >30 for 30-45 minutes after po   Small bites/sips; alternate liquid/solid with slow feeding rate   Oral care TID  Meds crushed    Outcome: Progressing Towards Goal   SPEECH LANGUAGE PATHOLOGY DYSPHAGIA TREATMENT    Patient: Thomas Jackson (50 y.o. male)  Date: 04/07/2021  Diagnosis: Obstructive uropathy [N13.9] <principal problem not specified>  Procedure(s) (LRB):  CYSTOSCOPY WITH LEFT RETROGRADES AND LEFT DOUBLE J STENT PLACEMENT/ C-ARM (Left) 4 Days Post-Op  Precautions: aspiration Fall  PLOF: As per H&P      ASSESSMENT:  Pt was seen at bedside for follow up dysphagia management. Pt communicating this AM appropriately during social conversation; however, limited participation observed. He requested different foods. Pt tolerated reg solid, puree, and thin liquids + straw consecutive swallows without any overt s/sx of aspiration. Pt demo labored mastication of solids, improved with soft solid presentations. Laryngeal elevation was functional to palpation. Recommend soft solid diet with thin liquids, aspiration precautions, oral care TID, and meds as tolerated. ST will continue to follow.     Progression toward goals:  [x]          Improving appropriately and progressing toward goals  []           Improving slowly and progressing toward goals  []          Not making progress toward goals and plan of care will be adjusted     PLAN:  Recommendations and Planned Interventions: See above  Patient continues to benefit from skilled intervention to address the above impairments. Continue treatment per established plan of care.     SUBJECTIVE:   Patient stated "Yes please."    OBJECTIVE:   Cognitive and Communication Status:  Neurologic State: Alert  Orientation Level: Oriented to person  Cognition: Decreased attention/concentration, Decreased command following  Perception: Appears intact  Perseveration: No perseveration noted  Safety/Judgement: Fall prevention  Dysphagia Treatment:  Oral Assessment:  Oral Assessment  Labial: Decreased seal  Dentition: Natural  Oral Hygiene: fair  Lingual: Incoordinated, Decreased strength, Decreased rate  Velum: Unable to visualize  Mandible: No impairment  Gag Reflex: No impairment  P.O. Trials:   Patient Position: 70   Vocal quality prior to P.O.: Low volume   Consistency Presented: Pudding, Solid, Thin liquid   How Presented: SLP-fed/presented   How Much: 10   Bolus Acceptance: No impairment   Bolus Formation/Control: Impaired   Type of Impairment: Lip closure, Delayed, Incomplete, Mastication   Propulsion: Delayed (# of seconds)   Oral Residue: None   Initiation of Swallow: Delayed (# of seconds)   Laryngeal Elevation: Functional   Aspiration Signs/Symptoms: None   Pharyngeal Phase Characteristics: Easily fatigued    Effective Modifications: Alternate liquids/solids, Small sips and bites, Other (comment)   Cues for Modifications: Moderate   Oral Phase  Severity: Moderate   Pharyngeal Phase Severity : Mild    PAIN:  Start of Tx: 0  End of Tx: 0     After treatment:   []               Patient left in no apparent distress sitting up in chair  [x]               Patient left in no apparent distress in bed  [x]               Call bell left within reach  [x]               Nursing  notified  []               Family present  []               Caregiver present  []               Bed alarm activated    COMMUNICATION/EDUCATION:   [x]  Aspiration precautions; swallow safety; compensatory techniques  [x]         Patient/family able to participate in training and education   []   Patient unable to participate in training and education, education ongoing with staff   []  Patient understands goals and intent of therapy; neutral about participation     Thank you for this referral.    Wendall Mola, M.S., CCC-SLP/L  Speech-Language Pathologist

## 2021-04-07 NOTE — Progress Notes (Signed)
 Problem: Self Care Deficits Care Plan (Adult)  Goal: *Acute Goals and Plan of Care (Insert Text)  Description: Occupational Therapy Goals  Initiated 04/04/2021 within 7 day(s).    1.  Patient will perform grooming with supervision/set-up sitting EOB with Good balance for >5 min.   2.  Patient will perform upper body dressing with supervision/set-up.  3.  Patient will perform functional activity in standing with supervision/set-up for >2 min.  4.  Patient will perform toilet transfers with supervision/set-up.  5.  Patient will perform all aspects of toileting with minimal assistance/contact guard assist.  6.  Patient will participate in upper extremity therapeutic exercise/activities with supervision/set-up for 8 minutes to improve endurance and UB strength needed for ADLs    7.  Patient will utilize energy conservation techniques during functional activities with verbal cues.    Prior Level of Function: Pt is poor historian due to aphasia. Pt indicates he receives assistance for toileting and LB ADLs, unsure of accuracy. Per chart review pt admitted from rehab.  Outcome: Progressing Towards Goal   OCCUPATIONAL THERAPY TREATMENT    Patient: Thomas Jackson (50 y.o. male)  Date: 04/07/2021  Diagnosis: Obstructive uropathy [N13.9] <principal problem not specified>  Procedure(s) (LRB):  CYSTOSCOPY WITH LEFT RETROGRADES AND LEFT DOUBLE J STENT PLACEMENT/ C-ARM (Left) 4 Days Post-Op  Precautions: Fall    Chart, occupational therapy assessment, plan of care, and goals were reviewed.  ASSESSMENT:  Pt sleeping upon entry. Arouses with increase time and verbal/tactile stimuli. Requires max encouragement for minimal participation. Pt requires assist w/trunk to maneuver to EOB. No command following for simple ADL grooming tasks at EOB. Pt refusing to standing activity/functional transfer training. Pt returned to supine for incontinent care, requiring Total Assist.   Progression toward goals:  []           Improving appropriately  and progressing toward goals  [x]           Improving slowly and progressing toward goals  []           Not making progress toward goals and plan of care will be adjusted     PLAN:  Patient continues to benefit from skilled intervention to address the above impairments.  Continue treatment per established plan of care.    Further Equipment Recommendations for Discharge:  TBD by next level of care    AMPAC: Based on an AM-PAC score of 8/24 and their current ADL deficits; it is recommended that the patient have 3-5 sessions per week of Occupational Therapy at d/c to increase the patient's independence.      This AMPAC score should be considered in conjunction with interdisciplinary team recommendations to determine the most appropriate discharge setting. Patient's social support, diagnosis, medical stability, and prior level of function should also be taken into consideration.     SUBJECTIVE:   Patient stated "no."    OBJECTIVE DATA SUMMARY:   Cognitive/Behavioral Status:  Neurologic State: Alert  Orientation Level: Oriented to person  Cognition: Decreased attention/concentration, Decreased command following  Safety/Judgement: Fall prevention    Functional Mobility and Transfers for ADLs:   Bed Mobility:  Supine to Sit: Minimum assistance  Sit to Supine: Additional time;Stand-by assistance  Scooting: Maximum assistance     Balance:  Sitting: Impaired;Without support    ADL Intervention:  Grooming  Position Performed: Seated edge of bed  Washing Hands: Total assistance (dependent)    Lower Body Dressing Assistance  Socks: Total assistance (dependent)    Toileting  Toileting Assistance: Total assistance(dependent)  Bowel Hygiene: Total assistance (dependent)  Clothing Management: Total assistance (dependent)    Pain:  Pain level pre-treatment: 0/10   Pain level post-treatment: 0/10    Activity Tolerance:    Poor    Please refer to the flowsheet for vital signs taken during this treatment.  After treatment:   []   Patient  left in no apparent distress sitting up in chair  [x]   Patient left in no apparent distress in bed  [x]   Call bell left within reach  [x]   Nursing, Callie, notified  []   Caregiver present  [x]   Bed alarm activated    COMMUNICATION/EDUCATION:   []  Role of Occupational Therapy in the acute care setting  []  Home safety education was provided and the patient/caregiver indicated understanding.  []  Patient/family have participated as able in working towards goals and plan of care.  []  Patient/family agree to work toward stated goals and plan of care.  []  Patient understands intent and goals of therapy, but is neutral about his/her participation.  []  Patient is unable to participate in goal setting and plan of care 2/2 confusion.      Thank you for this referral.  Leann Dextradeur, COTA  Time Calculation: 19 mins    [facility] AM-PAC Daily Activity Inpatient Short Form (6-Clicks)*    How much HELP from another person does the patient currently need.    (If the patient hasn't done an activity recently, how much help from another person do you think he/she would need if he/she tried?)   Total (Total A or Dep)   A Lot  (Mod to Max A)   A Little (Sup or Min A)   None (Mod I to I)   Putting on and taking off regular lower body clothing?   [x]  1 []  2 []  3 []  4   2. Bathing (including washing, rinsing,      drying)?    [x]  1 []  2 []  3 []  4   3. Toileting, which includes using toilet, bedpan or urinal?   [x]  1 []  2 []  3 []  4   4. Putting on and taking off regular upper body clothing?   [x]  1 []  2 []  3 []  4   5. Taking care of personal grooming such as brushing teeth?   [x]  1 []  2 []  3 []  4   6. Eating meals?   []  1 []  2 [x]  3 []  4

## 2021-04-08 ENCOUNTER — Inpatient Hospital Stay: Admit: 2021-04-08 | Payer: MEDICARE | Primary: Family

## 2021-04-08 LAB — POCT GLUCOSE
POC Glucose: 100 mg/dL (ref 70–110)
POC Glucose: 88 mg/dL (ref 70–110)
POC Glucose: 115 mg/dL — ABNORMAL HIGH (ref 70–110)
POC Glucose: 94 mg/dL (ref 70–110)

## 2021-04-08 LAB — CBC WITH AUTO DIFFERENTIAL
Basophils %: 1 % (ref 0–2)
Basophils Absolute: 0.2 10*3/uL — ABNORMAL HIGH (ref 0.0–0.1)
Eosinophils %: 3 % (ref 0–5)
Eosinophils Absolute: 0.6 10*3/uL — ABNORMAL HIGH (ref 0.0–0.4)
Granulocyte Absolute Count: 0 10*3/uL (ref 0.00–0.04)
Hematocrit: 30.6 % — ABNORMAL LOW (ref 36.0–48.0)
Hemoglobin: 9.9 g/dL — ABNORMAL LOW (ref 13.0–16.0)
Immature Granulocytes: 0 % (ref 0.0–0.5)
Lymphocytes %: 11 % — ABNORMAL LOW (ref 21–52)
Lymphocytes Absolute: 2.3 10*3/uL (ref 0.9–3.6)
MCH: 30 PG (ref 24.0–34.0)
MCHC: 32.4 g/dL (ref 31.0–37.0)
MCV: 92.7 FL (ref 78.0–100.0)
MPV: 9.5 FL (ref 9.2–11.8)
Monocytes %: 3 % (ref 3–10)
Monocytes Absolute: 0.6 10*3/uL (ref 0.05–1.2)
NRBC Absolute: 0 10*3/uL (ref 0.00–0.01)
Neutrophils %: 82 % — ABNORMAL HIGH (ref 40–73)
Neutrophils Absolute: 17.4 10*3/uL — ABNORMAL HIGH (ref 1.8–8.0)
Nucleated RBCs: 0 PER 100 WBC
Platelet Comment: INCREASED
Platelets: 553 10*3/uL — ABNORMAL HIGH (ref 135–420)
RBC: 3.3 M/uL — ABNORMAL LOW (ref 4.35–5.65)
RDW: 12.1 % (ref 11.6–14.5)
WBC: 21.1 10*3/uL — ABNORMAL HIGH (ref 4.6–13.2)

## 2021-04-08 LAB — RENAL FUNCTION PANEL
Albumin: 2.2 g/dL — ABNORMAL LOW (ref 3.4–5.0)
Albumin: 2.2 g/dL — ABNORMAL LOW (ref 3.4–5.0)
Anion Gap: 6 mmol/L (ref 3.0–18)
Anion gap: 6 mmol/L (ref 3.0–18)
BUN/Creatinine ratio: 9 — ABNORMAL LOW (ref 12–20)
BUN: 60 MG/DL — ABNORMAL HIGH (ref 7.0–18)
BUN: 60 MG/DL — ABNORMAL HIGH (ref 7.0–18)
Bun/Cre Ratio: 9 — ABNORMAL LOW (ref 12–20)
CO2: 26 mmol/L (ref 21–32)
CO2: 26 mmol/L (ref 21–32)
Calcium: 8.7 MG/DL (ref 8.5–10.1)
Calcium: 8.7 MG/DL (ref 8.5–10.1)
Chloride: 112 mmol/L — ABNORMAL HIGH (ref 100–111)
Chloride: 112 mmol/L — ABNORMAL HIGH (ref 100–111)
Creatinine: 6.55 MG/DL — ABNORMAL HIGH (ref 0.6–1.3)
Creatinine: 6.55 MG/DL — ABNORMAL HIGH (ref 0.6–1.3)
EGFR IF NonAfrican American: 9 mL/min/{1.73_m2} — ABNORMAL LOW (ref >60)
GFR African American: 11 mL/min/{1.73_m2} — ABNORMAL LOW (ref >60)
GFR est AA: 11 mL/min/{1.73_m2} — ABNORMAL LOW (ref >60)
GFR est non-AA: 9 mL/min/{1.73_m2} — ABNORMAL LOW (ref >60)
Glucose: 116 mg/dL — ABNORMAL HIGH (ref 74–99)
Glucose: 116 mg/dL — ABNORMAL HIGH (ref 74–99)
Phosphorus: 4.2 MG/DL (ref 2.5–4.9)
Phosphorus: 4.2 MG/DL (ref 2.5–4.9)
Potassium: 4.2 mmol/L (ref 3.5–5.5)
Potassium: 4.2 mmol/L (ref 3.5–5.5)
Sodium: 144 mmol/L (ref 136–145)
Sodium: 144 mmol/L (ref 136–145)

## 2021-04-08 LAB — AMMONIA
Ammonia, plasma: 37 umol/L — ABNORMAL HIGH (ref 11–32)
Ammonia: 37 umol/L — ABNORMAL HIGH (ref 11–32)

## 2021-04-08 LAB — CBC WITH AUTOMATED DIFF
ABS. BASOPHILS: 0.2 10*3/uL — ABNORMAL HIGH (ref 0.0–0.1)
ABS. EOSINOPHILS: 0.6 10*3/uL — ABNORMAL HIGH (ref 0.0–0.4)
ABS. IMM. GRANS.: 0 10*3/uL (ref 0.00–0.04)
ABS. LYMPHOCYTES: 2.3 10*3/uL (ref 0.9–3.6)
ABS. MONOCYTES: 0.6 10*3/uL (ref 0.05–1.2)
ABS. NEUTROPHILS: 17.4 10*3/uL — ABNORMAL HIGH (ref 1.8–8.0)
ABSOLUTE NRBC: 0 10*3/uL (ref 0.00–0.01)
BASOPHILS: 1 % (ref 0–2)
EOSINOPHILS: 3 % (ref 0–5)
HCT: 30.6 % — ABNORMAL LOW (ref 36.0–48.0)
HGB: 9.9 g/dL — ABNORMAL LOW (ref 13.0–16.0)
IMMATURE GRANULOCYTES: 0 % (ref 0.0–0.5)
LYMPHOCYTES: 11 % — ABNORMAL LOW (ref 21–52)
MCH: 30 PG (ref 24.0–34.0)
MCHC: 32.4 g/dL (ref 31.0–37.0)
MCV: 92.7 FL (ref 78.0–100.0)
MONOCYTES: 3 % (ref 3–10)
MPV: 9.5 FL (ref 9.2–11.8)
NEUTROPHILS: 82 % — ABNORMAL HIGH (ref 40–73)
NRBC: 0 PER 100 WBC
PLATELET COMMENTS: INCREASED
PLATELET: 553 10*3/uL — ABNORMAL HIGH (ref 135–420)
RBC: 3.3 M/uL — ABNORMAL LOW (ref 4.35–5.65)
RDW: 12.1 % (ref 11.6–14.5)
WBC: 21.1 10*3/uL — ABNORMAL HIGH (ref 4.6–13.2)

## 2021-04-08 LAB — GLUCOSE, POC
Glucose (POC): 100 mg/dL (ref 70–110)
Glucose (POC): 88 mg/dL (ref 70–110)
Glucose (POC): 115 mg/dL — ABNORMAL HIGH (ref 70–110)
Glucose (POC): 94 mg/dL (ref 70–110)

## 2021-04-08 MED ORDER — LACTATED RINGERS IV
INTRAVENOUS | Status: DC
Start: 2021-04-08 — End: 2021-04-11
  Administered 2021-04-08 – 2021-04-10 (×4): via INTRAVENOUS

## 2021-04-08 MED FILL — PANTOPRAZOLE 40 MG TAB, DELAYED RELEASE: 40 mg | ORAL | Qty: 1

## 2021-04-08 MED FILL — LEVETIRACETAM 500 MG/5 ML IV SOLN: 500 mg/5 mL | INTRAVENOUS | Qty: 5

## 2021-04-08 MED FILL — METOPROLOL TARTRATE 50 MG TAB: 50 mg | ORAL | Qty: 1

## 2021-04-08 MED FILL — CYANOCOBALAMIN (VITAMIN B-12) 2,500 MCG SUBLINGUAL TAB: 2500 mcg | SUBLINGUAL | Qty: 1

## 2021-04-08 MED FILL — HYDRALAZINE 50 MG TAB: 50 mg | ORAL | Qty: 2

## 2021-04-08 MED FILL — ESCITALOPRAM 10 MG TAB: 10 mg | ORAL | Qty: 1

## 2021-04-08 MED FILL — THIAMINE HCL 100 MG TAB: 100 mg | ORAL | Qty: 1

## 2021-04-08 MED FILL — CEFTRIAXONE 2 GRAM SOLUTION FOR INJECTION: 2 gram | INTRAMUSCULAR | Qty: 2

## 2021-04-08 MED FILL — LACTATED RINGERS IV: INTRAVENOUS | Qty: 1000

## 2021-04-08 MED FILL — ROSUVASTATIN 10 MG TAB: 10 mg | ORAL | Qty: 1

## 2021-04-08 MED FILL — AMLODIPINE 10 MG TAB: 10 mg | ORAL | Qty: 1

## 2021-04-08 NOTE — Progress Notes (Signed)
Infectious Disease progress Note        Reason: sepsis, complicated UTI    Current abx Prior abx   Cefepime, vancomycin  8/26-8/29  Ceftriaxone since 8/29 Ceftriaxone 8/25     Lines:       Assessment :    50 y.o. male with a PMHx of Intracranial Hemorrhage, CKD stage 4, metabolic encephalopathy, depression, HTN, GERD, hyperlipidemia, seizure disorder, T2DM and Vit D deficiency who presented to [facility] from [facility] on 04/03/21 for evaluation of complicated urinary tract infection.    Clinical presentation consistent with sepsis-present on admission due to complicated UTI, left kidney pyelonephritis/hydronephrosis    Status post cystoscopy, left ureteral stent placement on 04/03/2021  Intra-Op cultures 8/25-E. Coli        Altered mentation-likely metabolic encephalopathy.    Acute on chronic kidney disease-likely due to obstructive uropathy, sepsis    Antibiotic management complicated due to documented history of penicillin allergy- tolerated cefepime, ceftriaxone without difficulties    Persistent leukocytosis despite clinical improvement could be leukemoid reaction to acute kidney injury  Improved mentation.  Improved left CVA tenderness argues against worsening infection    Recommendations:    Continue ceftriaxone  Follow-up urology recommendations  Monitor CBC, temperature, clinically to determine timing of switch to oral antibiotics    Above plan was discussed in details with patient. Please call me if any further questions or concerns. Will continue to participate in the care of this patient.  HPI:    Feels better.  Denies nausea, worsening left flank pain.  Does not answer all questions asked.  Detailed review of system not feasible      Past Medical History:   Diagnosis Date    CKD (chronic kidney disease) stage 4, GFR 15-29 ml/min (HCC) 04/03/2021    Depression 04/03/2021    Essential hypertension 04/03/2021    GERD without esophagitis 04/03/2021    Intracerebral hemorrhage (HCC) 02/24/2021    Intracerebral hemorrhage,  nontraumatic (HCC)     Metabolic encephalopathy 04/03/2021    Mixed hyperlipidemia 04/03/2021    Obesity 04/03/2021    Seizure disorder (HCC) 04/03/2021    Type 2 diabetes mellitus (HCC) 04/03/2021    Vitamin D deficiency 04/03/2021       History reviewed. No pertinent surgical history.    home Medication List         Details   hydrALAZINE (APRESOLINE) 50 mg tablet Take 50 mg by mouth three (3) times daily.      amLODIPine (NORVASC) 10 mg tablet Take 1 Tablet by mouth in the morning.  Qty: 30 Tablet, Refills: 0      cyanocobalamin (VITAMIN B12) 2,500 mcg sublingual tablet Take 1 Tablet by mouth in the morning.  Qty: 30 Tablet, Refills: 0      ergocalciferol (ERGOCALCIFEROL) 1,250 mcg (50,000 unit) capsule Take 1 Capsule by mouth every seven (7) days.  Qty: 4 Capsule, Refills: 0      escitalopram oxalate (LEXAPRO) 5 mg tablet Take 1 Tablet by mouth every evening.  Qty: 30 Tablet, Refills: 0      levETIRAcetam (KEPPRA) 500 mg tablet Take 1 Tablet by mouth two (2) times a day.  Qty: 60 Tablet, Refills: 0      metoprolol tartrate (LOPRESSOR) 50 mg tablet Take 1 Tablet by mouth two (2) times a day.  Qty: 60 Tablet, Refills: 0      pantoprazole (PROTONIX) 40 mg tablet Take 1 Tablet by mouth Daily (before breakfast).  Qty: 30 Tablet, Refills: 0  rosuvastatin (CRESTOR) 10 mg tablet Take 1 Tablet by mouth nightly.  Qty: 30 Tablet, Refills: 0      thiamine HCL (B-1) 100 mg tablet Take 1 Tablet by mouth in the morning.  Qty: 30 Tablet, Refills: 0             Current Facility-Administered Medications   Medication Dose Route Frequency    cefTRIAXone (ROCEPHIN) 2 g in sterile water (preservative free) 20 mL IV syringe  2 g IntraVENous Q24H    hydrALAZINE (APRESOLINE) tablet 100 mg  100 mg Oral TID    amLODIPine (NORVASC) tablet 10 mg  10 mg Oral DAILY    insulin lispro (HUMALOG) injection   SubCUTAneous AC&HS    glucose chewable tablet 16 g  4 Tablet Oral PRN    glucagon (GLUCAGEN) injection 1 mg  1 mg IntraMUSCular PRN    dextrose  10% infusion 0-250 mL  0-250 mL IntraVENous PRN    pantoprazole (PROTONIX) tablet 40 mg  40 mg Oral ACB    rosuvastatin (CRESTOR) tablet 10 mg  10 mg Oral QHS    sodium chloride (NS) flush 5-40 mL  5-40 mL IntraVENous Q8H    sodium chloride (NS) flush 5-40 mL  5-40 mL IntraVENous PRN    acetaminophen (TYLENOL) tablet 650 mg  650 mg Oral Q6H PRN    Or    acetaminophen (TYLENOL) suppository 650 mg  650 mg Rectal Q6H PRN    polyethylene glycol (MIRALAX) packet 17 g  17 g Oral DAILY PRN    ondansetron (ZOFRAN ODT) tablet 4 mg  4 mg Oral Q8H PRN    Or    ondansetron (ZOFRAN) injection 4 mg  4 mg IntraVENous Q6H PRN    metoprolol tartrate (LOPRESSOR) tablet 50 mg  50 mg Oral BID    levETIRAcetam (KEPPRA) 500 mg in 0.9% sodium chloride (MBP/ADV) 100 mL MBP  500 mg IntraVENous Q12H    escitalopram oxalate (LEXAPRO) tablet 5 mg  5 mg Oral QPM    thiamine HCL (B-1) tablet 100 mg  100 mg Oral DAILY    cyanocobalamin (VITAMIN B12) sublingual tablet 2,500 mcg  2,500 mcg Oral DAILY       Allergies: Amoxicillin and Penicillin g    History reviewed. No pertinent family history.  Social History     Socioeconomic History    Marital status: SINGLE     Spouse name: Not on file    Number of children: Not on file    Years of education: Not on file    Highest education level: Not on file   Occupational History    Not on file   Tobacco Use    Smoking status: Not on file    Smokeless tobacco: Not on file   Substance and Sexual Activity    Alcohol use: Not on file    Drug use: Not on file    Sexual activity: Not on file   Other Topics Concern    Not on file   Social History Narrative    Not on file     Social Determinants of Health     Financial Resource Strain: Not on file   Food Insecurity: Not on file   Transportation Needs: Not on file   Physical Activity: Not on file   Stress: Not on file   Social Connections: Not on file   Intimate Partner Violence: Not on file   Housing Stability: Not on file     Social History  Tobacco Use    Smoking Status Not on file   Smokeless Tobacco Not on file        Temp (24hrs), Avg:98.4 ??F (36.9 ??C), Min:98.1 ??F (36.7 ??C), Max:98.5 ??F (36.9 ??C)    Visit Vitals  BP (!) 190/77 (BP 1 Location: Left upper arm, BP Patient Position: At rest)   Pulse 78   Temp 98.4 ??F (36.9 ??C)   Resp 18   Ht 5\' 3"  (1.6 m)   Wt 111.1 kg (245 lb)   SpO2 95%   BMI 43.40 kg/m??       ROS: unable to obtain due to patient factors    Physical Exam:    General: Well developed, well nourished male sitting on the bed AAOx3 in no acute distress.    General:   awake alert and oriented   HEENT:  Normocephalic, atraumatic,EOMI, no scleral icterus or pallor; no conjunctival hemmohage;  nasal and oral mucous are moist and without evidence of lesions. No thrush. Dentition good. Neck supple, no bruits.   Lymph Nodes:   no cervical, axillary or inguinal adenopathy   Lungs:   non-labored, bilateral chest movements equal, no audible wheezing   Heart:  RRR, s1 and s2; no  rubs or gallops   Abdomen:  soft, non-distended, active bowel sounds, no hepatomegaly, no splenomegaly. Non-tender   Genitourinary: Foley in place   Extremities:   no clubbing, cyanosis; no joint effusions or swelling; Full ROM of all large joints to the upper and lower extremities; muscle mass appropriate for age   Neurologic:  Does not follow commands.  Moves all 4 extremities.  Detailed neurologic evaluation not feasible                        Skin:  No rash or ulcers noted   Back:  no spinal or paraspinal muscle tenderness or rigidity, decreased left CVA tenderness     Psychiatric:  Unable to assess         Labs: Results:   Chemistry Recent Labs     04/08/21  0118 04/07/21  1000 04/06/21  0212   GLU 116* 87 113*   NA 144 145 142   K 4.2 4.3 4.1   CL 112* 113* 113*   CO2 26 26 23    BUN 60* 64* 79*   CREA 6.55* 6.82* 7.75*   CA 8.7 8.8 8.3*   AGAP 6 6 6    BUCR 9* 9* 10*   ALB 2.2* 2.2*  --         CBC w/Diff Recent Labs     04/06/21  0212   WBC 21.3*   RBC 2.90*   HGB 9.2*   HCT 27.1*    PLT 547*   GRANS 78*   LYMPH 16*   EOS 3        Microbiology No results for input(s): CULT in the last 72 hours.       RADIOLOGY:    All available imaging studies/reports in connect care for this admission were reviewed    Total time spent >35 minutes. High complexity decision making was performed during the evaluation of this patient at high risk for decompensation with multiple organ involvement     Above mentioned total time spent on reviewing the case/medical record/data/notes/EMR/patient examination/documentation/coordinating care with nurse/consultants, exclusive of procedures with complex decision making performed and > 50% time spent in face to face evaluation.     Disclaimer: Sections of this note  are dictated utilizing voice recognition software, which may have resulted in some phonetic based errors in grammar and contents. Even though attempts were made to correct all the mistakes, some may have been missed, and remained in the body of the document. If questions arise, please contact our department.    Dr. Raiford Simmonds, Infectious Disease Specialist  [phone]  April 08, 2021  9:01 AM

## 2021-04-08 NOTE — Progress Notes (Signed)
Discharge/Transition Planning     Patient had apashia and not able to answer questions. Called patient spouse , Marcelino Duster [phone] several times but phone goes straight to voicemail . Left voicemail to call CM to work on safe discharge plan    Reviewed chart . Patient had been at [facility] in July and discharged to [facility] for short term rehab on 03/05/21. Unclear if still at SNF or not.     Looks to have walker and wheelchair ? At home    Has Medicare [place] and Medicaid of [place].    CM will continue to try and reach spouse

## 2021-04-08 NOTE — Progress Notes (Signed)
Problem: Self Care Deficits Care Plan (Adult)  Goal: *Acute Goals and Plan of Care (Insert Text)  Description: Occupational Therapy Goals  Initiated 04/04/2021 within 7 day(s).    1.  Patient will perform grooming with supervision/set-up sitting EOB with Good balance for >5 min.   2.  Patient will perform upper body dressing with supervision/set-up.  3.  Patient will perform functional activity in standing with supervision/set-up for >2 min.  4.  Patient will perform toilet transfers with supervision/set-up.  5.  Patient will perform all aspects of toileting with minimal assistance/contact guard assist.  6.  Patient will participate in upper extremity therapeutic exercise/activities with supervision/set-up for 8 minutes to improve endurance and UB strength needed for ADLs    7.  Patient will utilize energy conservation techniques during functional activities with verbal cues.    Prior Level of Function: Pt is poor historian due to aphasia. Pt indicates he receives assistance for toileting and LB ADLs, unsure of accuracy. Per chart review pt admitted from rehab.  Outcome: Progressing Towards Goal   OCCUPATIONAL THERAPY TREATMENT    Patient: Thomas Jackson (50 y.o. male)  Date: 04/08/2021  Diagnosis: Obstructive uropathy [N13.9] <principal problem not specified>  Procedure(s) (LRB):  CYSTOSCOPY WITH LEFT RETROGRADES AND LEFT DOUBLE J STENT PLACEMENT/ C-ARM (Left) 5 Days Post-Op  Precautions: Fall    Chart, occupational therapy assessment, plan of care, and goals were reviewed.  ASSESSMENT:  Co-treated w/PT to maximize pt participation and safety w/OOB activity. Pt more alert today , however, remains confused, oriented to self only. Pt w/improved command following this date as evidenced by pt performing ADL grooming tasks seated EOB. Pt requires moderate vc's for sequencing oral care task. Pt requires max encouragement for functional transfer/mobility training for carryover w/functional mobility/transfers to bathroom.  Poor safety awareness requires Min Assist w/safety w/RW and functional mobility. Pt returned to supine and left w/all items within reach.   Progression toward goals:  []           Improving appropriately and progressing toward goals  [x]           Improving slowly and progressing toward goals  []           Not making progress toward goals and plan of care will be adjusted     PLAN:  Patient continues to benefit from skilled intervention to address the above impairments.  Continue treatment per established plan of care.    Further Equipment Recommendations for Discharge:  TBD by next level of care    AMPAC: Based on an AM-PAC score of 15/24 and their current ADL deficits; it is recommended that the patient have 3-5 sessions per week of Occupational Therapy at d/c to increase the patient's independence.     This AMPAC score should be considered in conjunction with interdisciplinary team recommendations to determine the most appropriate discharge setting. Patient's social support, diagnosis, medical stability, and prior level of function should also be taken into consideration.     SUBJECTIVE:   Patient stated "I don't know."    OBJECTIVE DATA SUMMARY:   Cognitive/Behavioral Status:  Neurologic State: Alert  Orientation Level: Oriented to person  Cognition: Follows commands, Poor safety awareness  Safety/Judgement: Fall prevention    Functional Mobility and Transfers for ADLs:   Bed Mobility:  Supine to Sit: Additional time;Moderate assistance  Sit to Supine: Additional time;Stand-by assistance  Scooting: Stand-by assistance   Transfers:  Sit to Stand: Minimum assistance (w/RW)   Bathroom Mobility: Minimum assistance (w/RW)  Balance:  Sitting: Intact  Standing: Impaired;With support    ADL Intervention:  Grooming  Position Performed: Seated edge of bed  Washing Face: Stand-by assistance  Brushing Teeth: Stand-by assistance    Pain:  Pain level pre-treatment: 0/10   Pain level post-treatment: 0/10  Pt c/o L flank and  LLE pain w/functional mobility    Activity Tolerance:    Fair    Please refer to the flowsheet for vital signs taken during this treatment.  After treatment:   []   Patient left in no apparent distress sitting up in chair  [x]   Patient left in no apparent distress in bed  [x]   Call bell left within reach  []   Nursing notified  [x]   sitter present  []   Bed alarm activated    COMMUNICATION/EDUCATION:   []  Role of Occupational Therapy in the acute care setting  []  Home safety education was provided and the patient/caregiver indicated understanding.  []  Patient/family have participated as able in working towards goals and plan of care.  []  Patient/family agree to work toward stated goals and plan of care.  []  Patient understands intent and goals of therapy, but is neutral about his/her participation.  [x]  Patient is unable to participate in goal setting and plan of care 2/2 confusion.      Thank you for this referral.  Leann Dextradeur, COTA  Time Calculation: 23 mins    [facility] AM-PAC Daily Activity Inpatient Short Form (6-Clicks)*    How much HELP from another person does the patient currently need.    (If the patient hasn't done an activity recently, how much help from another person do you think he/she would need if he/she tried?)   Total (Total A or Dep)   A Lot  (Mod to Max A)   A Little (Sup or Min A)   None (Mod I to I)   Putting on and taking off regular lower body clothing?   []  1 [x]  2 []  3 []  4   2. Bathing (including washing, rinsing,      drying)?    []  1 [x]  2 []  3 []  4   3. Toileting, which includes using toilet, bedpan or urinal?   []  1 [x]  2 []  3 []  4   4. Putting on and taking off regular upper body clothing?   []  1 []  2 [x]  3 []  4   5. Taking care of personal grooming such as brushing teeth?   []  1 []  2 [x]  3 []  4   6. Eating meals?   []  1 []  2 [x]  3 []  4

## 2021-04-08 NOTE — Progress Notes (Signed)
Problem: Pressure Injury - Risk of  Goal: *Prevention of pressure injury  Description: Document Braden Scale and appropriate interventions in the flowsheet.  Outcome: Progressing Towards Goal  Note: Pressure Injury Interventions:  Sensory Interventions: Assess changes in LOC, Check visual cues for pain, Minimize linen layers    Moisture Interventions: Absorbent underpads, Apply protective barrier, creams and emollients    Activity Interventions: Pressure redistribution bed/mattress(bed type)    Mobility Interventions: Pressure redistribution bed/mattress (bed type)    Nutrition Interventions: Document food/fluid/supplement intake    Friction and Shear Interventions: Lift sheet, Lift team/patient mobility team                Problem: Patient Education: Go to Patient Education Activity  Goal: Patient/Family Education  Outcome: Progressing Towards Goal     Problem: Falls - Risk of  Goal: *Absence of Falls  Description: Document Schmid Fall Risk and appropriate interventions in the flowsheet.  Outcome: Progressing Towards Goal  Note: Fall Risk Interventions:  Mobility Interventions: Bed/chair exit alarm, Patient to call before getting OOB    Mentation Interventions: Bed/chair exit alarm    Medication Interventions: Bed/chair exit alarm    Elimination Interventions: Call light in reach, Patient to call for help with toileting needs              Problem: Patient Education: Go to Patient Education Activity  Goal: Patient/Family Education  Outcome: Progressing Towards Goal     Problem: Patient Education: Go to Patient Education Activity  Goal: Patient/Family Education  Outcome: Progressing Towards Goal     Problem: Patient Education: Go to Patient Education Activity  Goal: Patient/Family Education  Outcome: Progressing Towards Goal     Problem: Urinary Elimination - Impaired  Goal: *Absence/decrease in episodes of  urinary incontinence  Outcome: Progressing Towards Goal     Problem: Patient Education: Go to Patient  Education Activity  Goal: Patient/Family Education  Outcome: Progressing Towards Goal     Problem: Pain  Goal: *Control of Pain  Outcome: Progressing Towards Goal     Problem: Patient Education: Go to Patient Education Activity  Goal: Patient/Family Education  Outcome: Progressing Towards Goal

## 2021-04-08 NOTE — Progress Notes (Signed)
Problem: Pressure Injury - Risk of  Goal: *Prevention of pressure injury  Description: Document Braden Scale and appropriate interventions in the flowsheet.  Outcome: Progressing Towards Goal  Note: Pressure Injury Interventions:  Sensory Interventions: Check visual cues for pain    Moisture Interventions: Absorbent underpads, Apply protective barrier, creams and emollients, Internal/External urinary devices    Activity Interventions: Assess need for specialty bed    Mobility Interventions: Assess need for specialty bed, HOB 30 degrees or less, Pressure redistribution bed/mattress (bed type)    Nutrition Interventions: Document food/fluid/supplement intake    Friction and Shear Interventions: HOB 30 degrees or less, Apply protective barrier, creams and emollients                Problem: Falls - Risk of  Goal: *Absence of Falls  Description: Document Schmid Fall Risk and appropriate interventions in the flowsheet.  Outcome: Progressing Towards Goal  Note: Fall Risk Interventions:  Mobility Interventions: Patient to call before getting OOB    Mentation Interventions: Bed/chair exit alarm    Medication Interventions: Assess postural VS orthostatic hypotension, Bed/chair exit alarm, Patient to call before getting OOB, Teach patient to arise slowly    Elimination Interventions: Call light in reach, Bed/chair exit alarm              Problem: Urinary Elimination - Impaired  Goal: *Absence/decrease in episodes of  urinary incontinence  Outcome: Progressing Towards Goal     Problem: Pain  Goal: *Control of Pain  Outcome: Progressing Towards Goal

## 2021-04-08 NOTE — Progress Notes (Signed)
Progress Notes by Georgiann Cocker, MD at 04/08/21 1720                Author: Georgiann Cocker, MD  Service: Nephrology  Author Type: Physician       Filed: 04/08/21 1723  Date of Service: 04/08/21 1720  Status: Signed          Editor: Georgiann Cocker, MD (Physician)                       In Patient Progress note         Admit Date: 04/03/2021        Impression:        1) AKI on CKD 4 ( baseline 3-4 creatinine ) d/t ATN in setting of sepsis    , some obst component causing AKI as well, renal functions improving    2) Concern for severe Septis ,Urosepsis d/t gm -ve rods urine cx    3) S/p cysto with stent placed   4) Left mild hydro with UPJ stone ,Urology following    5) Metabolic acidoses improving    6) Hypernatremia better   7) Altered mental status    8) h/o intracranial hemorrhage        Plan:   1) strict intake/output    2) continue LR @ 75 cc/hrs   3) avoid IV contrast, nephrotoxins   4) renally dose AB    5) supportive care per primary team        discussed with nursing/primary team, Dr Margo Aye       Please call with questions,       Robyne Askew, MD FASN   Cell [phone]   Pager: [phone]          Subjective:        - sleeping    - No acute over night events.   - respiratory - stable   - hemodynamics - stable, no pressrs   - UOP-ok   - Nutrition -ok        Objective:        Visit Vitals      BP  (!) 160/72 (BP 1 Location: Left upper arm, BP Patient Position: At rest)     Pulse  81     Temp  98.4 ??F (36.9 ??C)     Resp  18     Ht  5\' 3"  (1.6 m)     Wt  111.1 kg (245 lb)     SpO2  94%        BMI  43.40 kg/m??              Intake/Output Summary (Last 24 hours) at 04/08/2021 1721   Last data filed at 04/08/2021 04/10/2021     Gross per 24 hour        Intake  --        Output  2550 ml        Net  -2550 ml                Physical Exam:        Patient is in no apparent distress.    HEENT: mmm   Neck: no cervical lymphadenopathy or thyromegaly.    Lungs: good air entry, clear to auscultation bilaterally.     Cardiovascular system: S1, S2, regular rate and rhythm.    Abdomen: soft, non tender, non distended.     Extremities: no clubbing, cyanosis  or edema.    Integumentary: skin is grossly intact.              Data Review:        Recent Labs           04/08/21   0927     WBC  21.1*     RBC  3.30*     HCT  30.6*     MCV  92.7     MCH  30.0     MCHC  32.4        RDW  12.1             Recent Labs             04/08/21   0118  04/07/21   1000  04/06/21   0212     BUN  60*  64*  79*     CREA  6.55*  6.82*  7.75*     CA  8.7  8.8  8.3*     ALB  2.2*  2.2*   --      K  4.2  4.3  4.1     NA  144  145  142     CL  112*  113*  113*     CO2  26  26  23      PHOS  4.2  4.4   --           GLU  116*  87  113*              , MD

## 2021-04-08 NOTE — Progress Notes (Signed)
 Problem: Mobility Impaired (Adult and Pediatric)  Goal: *Acute Goals and Plan of Care (Insert Text)  Description: Physical Therapy Goals  Initiated 04/04/2021 and to be accomplished within 7 day(s)  1.  Patient will move from supine to sit and sit to supine  in bed with supervision/set-up.    2.  Patient will transfer from bed to chair and chair to bed with supervision/set-up using the least restrictive device.  3.  Patient will perform sit to stand with supervision/set-up.  4.  Patient will ambulate with supervision/set-up for 50 feet with the least restrictive device.     PLOF: Unable to provide information d/t aphasis. Per chart, admitted from facility; unclear if short stay rehab or long term care.      Outcome: Progressing Towards Goal     PHYSICAL THERAPY TREATMENT    Patient: Thomas Jackson (49 y.o. male)  Date: 04/08/2021  Diagnosis: Obstructive uropathy [N13.9] <principal problem not specified>  Procedure(s) (LRB):  CYSTOSCOPY WITH LEFT RETROGRADES AND LEFT DOUBLE J STENT PLACEMENT/ C-ARM (Left) 5 Days Post-Op  Precautions: Fall  PLOF: see above     ASSESSMENT:  Pt received in bed in NAD and agreeable, needing max encouragement for OOB activity this date. Pt needs increased time for completion of all tasks, good sitting balance unsupported noted at EOB for hygiene task, pt only oriented to self at this time. Pt tolerated approx 40 ft of gait with the RW, min/CGA throughout for safety and steadying, decreased step clearance and stride clearance noted, verbal cues for turning safely with the RW however no LOB noted. Pt reports R sided LBP/hip pain but does not specify, able to get back into bed with SBA this date. Will continue to follow per POC, recommend SNF at discharge. Left in bed with sitter at bedside and all needs met.   Progression toward goals:   [x]       Improving appropriately and progressing toward goals  []       Improving slowly and progressing toward goals  []       Not making progress toward  goals and plan of care will be adjusted     PLAN:  Patient continues to benefit from skilled intervention to address the above impairments.  Continue treatment per established plan of care.    Further Equipment Recommendations for Discharge:  rolling walker    AMPAC:   Based on an AM-PAC score of 17/24 (**/20 if omitting stairs) and their current functional mobility deficits, it is recommended that the patient have 3-5 sessions per week of Physical Therapy at d/c to increase the patient's independence.     This AMPAC score should be considered in conjunction with interdisciplinary team recommendations to determine the most appropriate discharge setting. Patient's social support, diagnosis, medical stability, and prior level of function should also be taken into consideration.     SUBJECTIVE:   Patient stated "I dont want to."    OBJECTIVE DATA SUMMARY:   Critical Behavior:  Neurologic State: Alert  Orientation Level: Oriented to person  Cognition: Follows commands, Poor safety awareness  Safety/Judgement: Fall prevention  Functional Mobility Training:  Bed Mobility:     Supine to Sit: Additional time;Moderate assistance  Sit to Supine: Additional time;Stand-by assistance  Scooting: Stand-by assistance         Transfers:  Sit to Stand: Minimum assistance (w/RW)  Stand to Sit: Contact guard assistance             Balance:  Sitting: Intact  Standing: Impaired;With  support     Ambulation/Gait Training:  Distance (ft): 40 Feet (ft)  Assistive Device: Walker, rolling  Ambulation - Level of Assistance: Minimal assistance;Contact guard assistance        Gait Abnormalities: Decreased step clearance        Base of Support: Narrowed     Speed/Cadence: Pace decreased (<100 feet/min)  Step Length: Left shortened;Right shortened        Pain:  Pain level pre-treatment: 0/10  Pain level post-treatment: 0/10   Pain Intervention(s): Medication (see MAR); Rest, Ice, Repositioning   Response to intervention: Nurse notified  Pt endorses  R LBP but does not rate or specify    Activity Tolerance:   Good    Please refer to the flowsheet for vital signs taken during this treatment.  After treatment:   []  Patient left in no apparent distress sitting up in chair  [x]  Patient left in no apparent distress in bed  [x]  Call bell left within reach  [x]  Nursing notified  []  Caregiver present  []  Bed alarm activated  []  SCDs applied      COMMUNICATION/EDUCATION:   [x]          Role of Physical Therapy in the acute care setting.  [x]          Fall prevention education was provided and the patient/caregiver indicated understanding.  [x]          Patient/family have participated as able in working toward goals and plan of care.  [x]          Patient/family agree to work toward stated goals and plan of care.  []          Patient understands intent and goals of therapy, but is neutral about his/her participation.  []          Patient is unable to participate in stated goals/plan of care: ongoing with therapy staff.  []          Other:        Duwaine Sanfilippo   Time Calculation: 23 mins    [employer] AM-PAC Basic Mobility Inpatient Short Form (6-Clicks) Version 2    How much HELP from another person does the patient currently need.    (If the patient hasn't done an activity recently, how much help from another person do you think he/she would need if he/she tried?)   Total (Total A or Dep)   A Lot  (Mod to Max A)   A Little (Sup or Min A)   None (Mod I to I)   Turning from your back to your side while in a flat bed without using bedrails?   []  1 []  2 [x]  3 []  4   2. Moving from lying on your back to sitting on the side of a flat bed without using bedrails?    []  1 []  2 [x]  3 []  4   3. Moving to and from a bed to a chair (including a wheelchair)?   []  1 []  2 [x]  3 []  4   4. Standing up from a chair using your arms (e.g., wheelchair, or bedside chair)?   []  1 []  2 [x]  3 []  4   5. Walking in hospital room?   []  1 []  2 [x]  3 []  4   6. Climbing 3-5 steps with a railing?+   []   1 [x]  2 []  3 []  4   +If stair climbing cannot be assessed, skip item #6.  Sum responses from items 1-5.  Based on an AM-PAC score of 17/24 (**/20 if omitting stairs) and their current functional mobility deficits, it is recommended that the patient have 3-5 sessions per week of Physical Therapy at d/c to increase the patient's independence.

## 2021-04-08 NOTE — Progress Notes (Signed)
Bedside shift change report given to Daphne,RN (oncoming nurse) by Evangeline Gula (offgoing nurse). Report included the following information SBAR, Kardex, Procedure Summary, MAR, and Recent Results.   Wound Prevention Checklist    Patient: Thomas Jackson (49 y.o. male)  Date: 04/08/2021  Diagnosis: Obstructive uropathy [N13.9] <principal problem not specified>    Precautions: Fall       []   Heel prevention boots placed on patient    []   Patient turned q2h during shift    []   Lift team ordered    []   Patient on [employer] bed/Specialty bed    []   Each Wound is documented during shift (Stage, Color, drainage, odor, measurements, and dressings)    [x]   Dual skin checks done at bedside during shift report with Daphne,RN    , RN

## 2021-04-08 NOTE — Progress Notes (Signed)
Problem: Dysphagia (Adult)  Goal: *Acute Goals and Plan of Care (Insert Text)  Description: Patient will:  1. Tolerate PO trials with 0 s/s overt distress in 4/5 trials  2. Utilize compensatory swallow strategies/maneuvers (decrease bite/sip, size/rate, alt. liq/sol) with min cues in 4/5 trials  3. Perform oral-motor/laryngeal exercises to increase oropharyngeal swallow function with min cues  4. Complete an objective swallow study (i.e., MBSS) to assess swallow integrity, r/o aspiration, and determine of safest LRD, min A  5. Participate in Speech/language eval to maximize functional communication in current setting    Rec:     Soft/bite sized with thin liquids   Aspiration precautions  HOB >45 during po intake, remain >30 for 30-45 minutes after po   Small bites/sips; alternate liquid/solid with slow feeding rate   Oral care TID  Meds crushed    Outcome: Not Progressing Towards Goal   SPEECH LANGUAGE PATHOLOGY DYSPHAGIA TREATMENT    Patient: Thomas Jackson (50 y.o. male)  Date: 04/08/2021  Diagnosis: Obstructive uropathy [N13.9] <principal problem not specified>  Procedure(s) (LRB):  CYSTOSCOPY WITH LEFT RETROGRADES AND LEFT DOUBLE J STENT PLACEMENT/ C-ARM (Left) 5 Days Post-Op  Precautions: aspiration Fall  PLOF: As per H&P      ASSESSMENT:  Pt was seen at bedside for follow up dysphagia management. He refused all PO other than consecutive swallows of thin liquids via straw. Laryngeal elevation was functional/timely to palpation. Pt able to respond appropriately to questions stating, "Just leave me lay here. I don't want to eat." Continue to rec soft/bite sized with thin liquids, aspiration precautions, oral care TID, and meds as tolerated. ST will continue to follow.     Progression toward goals:  []          Improving appropriately and progressing toward goals  []          Improving slowly and progressing toward goals  [x]          Not making progress toward goals and plan of care will be adjusted      PLAN:  Recommendations and Planned Interventions: See above  Patient continues to benefit from skilled intervention to address the above impairments. Continue treatment per established plan of care.     SUBJECTIVE:   Patient stated "Leave me lay here."    OBJECTIVE:   Cognitive and Communication Status:  Neurologic State: Alert  Orientation Level: Oriented to person  Cognition: Decreased attention/concentration, Decreased command following  Perception: Appears intact  Perseveration: No perseveration noted  Safety/Judgement: Fall prevention    Dysphagia Treatment: see above    PAIN:  Start of Tx: 0  End of Tx: 0     After treatment:   []               Patient left in no apparent distress sitting up in chair  [x]               Patient left in no apparent distress in bed  [x]               Call bell left within reach  [x]               Nursing notified  []               Family present  []               Caregiver present  []               Bed alarm activated    COMMUNICATION/EDUCATION:   [  x] Aspiration precautions; swallow safety; compensatory techniques  []         Patient/family able to participate in training and education   []   Patient unable to participate in training and education, education ongoing with staff   [x]  Patient understands goals and intent of therapy; neutral about participation     Thank you for this referral.    Wendall Mola, M.S., CCC-SLP/L  Speech-Language Pathologist

## 2021-04-08 NOTE — Progress Notes (Signed)
[facility]  Progress Note    Patient: Thomas Jackson Age: 50 y.o. DOB: 1970-10-19 MR#: [id number] SSN: [id number]  Date/Time: 04/08/2021     Subjective: Patient lying in bed. A bit more alert than yesterday but continues to be less interactive than before.      Assessment/Plan:     Hospital Problems  Date Reviewed: 2021/04/17            Codes Class Noted POA    Obstructive uropathy ICD-10-CM: N13.9  ICD-9-CM: 599.60  2021-04-17 Unknown        CKD (chronic kidney disease) stage 4, GFR 15-29 ml/min (HCC) ICD-10-CM: N18.4  ICD-9-CM: 585.4  April 17, 2021 Yes        Seizure disorder (HCC) ICD-10-CM: G40.909  ICD-9-CM: 345.90  April 17, 2021 Yes        Type 2 diabetes mellitus (HCC) ICD-10-CM: E11.9  ICD-9-CM: 250.00  2021/04/17 Yes        Metabolic encephalopathy ICD-10-CM: G93.41  ICD-9-CM: 348.31  04/17/2021 Yes        Depression ICD-10-CM: F32.A  ICD-9-CM: 311  04/17/21 Yes        Essential hypertension ICD-10-CM: I10  ICD-9-CM: 401.9  04/17/21 Yes        Mixed hyperlipidemia ICD-10-CM: E78.2  ICD-9-CM: 272.2  04-17-2021 Yes        GERD without esophagitis ICD-10-CM: K21.9  ICD-9-CM: 530.81  04-17-21 Yes        Vitamin D deficiency ICD-10-CM: E55.9  ICD-9-CM: 268.9  04/17/2021 Yes        Obesity ICD-10-CM: E66.9  ICD-9-CM: 278.00  Apr 17, 2021 Yes        Anemia ICD-10-CM: D64.9  ICD-9-CM: 285.9  04-17-21 Yes        UTI (urinary tract infection) ICD-10-CM: N39.0  ICD-9-CM: 599.0  2021/04/17 Yes        AKI (acute kidney injury) (HCC) ICD-10-CM: N17.9  ICD-9-CM: 584.9  04-17-21 Yes        Sepsis (HCC) ICD-10-CM: A41.9  ICD-9-CM: 038.9, 995.91  Apr 17, 2021 Yes        Intracerebral hemorrhage (HCC) ICD-10-CM: I61.9  ICD-9-CM: 431  02/24/2021 Yes       Sepsis POA (HR 102, WBC 24K, complicated UTI)  Complicated UTI - Ecoli, pan-sensitive   Obstructive uropathy, left renal stone with hydronephrosis.  Status post cystoscopy, stent placement by urology  AKI on CKD 4,   Metabolic acidosis,  improved  Uremia  Hypernatremia, decreased free water intake  Hypertension  Leukocytosis, trending down  Thrombocytosis, reactive  Normocytic anemia  History of intracranial hemorrhage, stable  Acute metabolic encephalopathy due to multifactorial including sepsis, uremia, hypernatremia,  Seizure disorder, stable  Type 2 diabetes mellitus      Plan:  Urine culture growing EColi. On ceftriaxone.   ID following  Further plan per urology for definitive stone management  We will continue hydralazine, metoprolol and resume amlodipine, adjust based on the blood pressure  Continue IV fluids, off bicarb drip per nephrology  No need for HD currently  Continue Keppra  Continue PPI  PT/OT eval and treatment, recommend SNF placement    Discussed with the patient  Called and left a message to fianc??      Case discussed with:  [x] Patient  [x] Family  [x] Nursing  [] Case Management  DVT Prophylaxis:  [] Lovenox  [] Hep SQ  [x] SCDs  [] Coumadin   [] Eliquis/Xarelto     Objective:   VS: Visit Vitals  BP (!) 160/77 (BP 1 Location: Left upper arm, BP  Patient Position: At rest)   Pulse 78   Temp 99 ??F (37.2 ??C)   Resp 16   Ht 5\' 3"  (1.6 m)   Wt 111.1 kg (245 lb)   SpO2 93%   BMI 43.40 kg/m??        Tmax/24hrs: Temp (24hrs), Avg:98.5 ??F (36.9 ??C), Min:98.4 ??F (36.9 ??C), Max:99 ??F (37.2 ??C)  IOBRIEF  Intake/Output Summary (Last 24 hours) at 04/08/2021 2219  Last data filed at 04/08/2021 1622  Gross per 24 hour   Intake --   Output 2950 ml   Net -2950 ml         General:  Alert, cooperative, no acute distress    HEENT: PERRLA, anicteric sclerae.  Pulmonary:  CTA Bilaterally. No Wheezing/Rales.  Cardiovascular: Regular rate and Rhythm.  GI:  Soft, Non distended, Non tender. + Bowel sounds.  Extremities:  No edema. No calf tenderness.   Psych: Not anxious or agitated.  Neurologic: Alert awake, receptive aphasia, follows commands inconsistently, moves left side better than right side.  Additional:    Medications:   Current Facility-Administered  Medications   Medication Dose Route Frequency    lactated Ringers infusion  75 mL/hr IntraVENous CONTINUOUS    cefTRIAXone (ROCEPHIN) 2 g in sterile water (preservative free) 20 mL IV syringe  2 g IntraVENous Q24H    hydrALAZINE (APRESOLINE) tablet 100 mg  100 mg Oral TID    amLODIPine (NORVASC) tablet 10 mg  10 mg Oral DAILY    insulin lispro (HUMALOG) injection   SubCUTAneous AC&HS    glucose chewable tablet 16 g  4 Tablet Oral PRN    glucagon (GLUCAGEN) injection 1 mg  1 mg IntraMUSCular PRN    dextrose 10% infusion 0-250 mL  0-250 mL IntraVENous PRN    pantoprazole (PROTONIX) tablet 40 mg  40 mg Oral ACB    rosuvastatin (CRESTOR) tablet 10 mg  10 mg Oral QHS    sodium chloride (NS) flush 5-40 mL  5-40 mL IntraVENous Q8H    sodium chloride (NS) flush 5-40 mL  5-40 mL IntraVENous PRN    acetaminophen (TYLENOL) tablet 650 mg  650 mg Oral Q6H PRN    Or    acetaminophen (TYLENOL) suppository 650 mg  650 mg Rectal Q6H PRN    polyethylene glycol (MIRALAX) packet 17 g  17 g Oral DAILY PRN    ondansetron (ZOFRAN ODT) tablet 4 mg  4 mg Oral Q8H PRN    Or    ondansetron (ZOFRAN) injection 4 mg  4 mg IntraVENous Q6H PRN    metoprolol tartrate (LOPRESSOR) tablet 50 mg  50 mg Oral BID    levETIRAcetam (KEPPRA) 500 mg in 0.9% sodium chloride (MBP/ADV) 100 mL MBP  500 mg IntraVENous Q12H    escitalopram oxalate (LEXAPRO) tablet 5 mg  5 mg Oral QPM    thiamine HCL (B-1) tablet 100 mg  100 mg Oral DAILY    cyanocobalamin (VITAMIN B12) sublingual tablet 2,500 mcg  2,500 mcg Oral DAILY       Labs:    Recent Results (from the past 24 hour(s))   RENAL FUNCTION PANEL    Collection Time: 04/08/21  1:18 AM   Result Value Ref Range    Sodium 144 136 - 145 mmol/L    Potassium 4.2 3.5 - 5.5 mmol/L    Chloride 112 (H) 100 - 111 mmol/L    CO2 26 21 - 32 mmol/L    Anion gap 6 3.0 - 18 mmol/L  Glucose 116 (H) 74 - 99 mg/dL    BUN 60 (H) 7.0 - 18 MG/DL    Creatinine 9.92 (H) 0.6 - 1.3 MG/DL    BUN/Creatinine ratio 9 (L) 12 - 20      GFR est  AA 11 (L) >60 ml/min/1.68m2    GFR est non-AA 9 (L) >60 ml/min/1.45m2    Calcium 8.7 8.5 - 10.1 MG/DL    Phosphorus 4.2 2.5 - 4.9 MG/DL    Albumin 2.2 (L) 3.4 - 5.0 g/dL   GLUCOSE, POC    Collection Time: 04/08/21  7:52 AM   Result Value Ref Range    Glucose (POC) 88 70 - 110 mg/dL   CBC WITH AUTOMATED DIFF    Collection Time: 04/08/21  9:27 AM   Result Value Ref Range    WBC 21.1 (H) 4.6 - 13.2 K/uL    RBC 3.30 (L) 4.35 - 5.65 M/uL    HGB 9.9 (L) 13.0 - 16.0 g/dL    HCT 42.6 (L) 83.4 - 48.0 %    MCV 92.7 78.0 - 100.0 FL    MCH 30.0 24.0 - 34.0 PG    MCHC 32.4 31.0 - 37.0 g/dL    RDW 19.6 22.2 - 97.9 %    PLATELET 553 (H) 135 - 420 K/uL    MPV 9.5 9.2 - 11.8 FL    NRBC 0.0 0 PER 100 WBC    ABSOLUTE NRBC 0.00 0.00 - 0.01 K/uL    NEUTROPHILS 82 (H) 40 - 73 %    LYMPHOCYTES 11 (L) 21 - 52 %    MONOCYTES 3 3 - 10 %    EOSINOPHILS 3 0 - 5 %    BASOPHILS 1 0 - 2 %    IMMATURE GRANULOCYTES 0 0.0 - 0.5 %    ABS. NEUTROPHILS 17.4 (H) 1.8 - 8.0 K/UL    ABS. LYMPHOCYTES 2.3 0.9 - 3.6 K/UL    ABS. MONOCYTES 0.6 0.05 - 1.2 K/UL    ABS. EOSINOPHILS 0.6 (H) 0.0 - 0.4 K/UL    ABS. BASOPHILS 0.2 (H) 0.0 - 0.1 K/UL    ABS. IMM. GRANS. 0.0 0.00 - 0.04 K/UL    DF MANUAL      PLATELET COMMENTS Increased Platelets      RBC COMMENTS NORMOCYTIC, NORMOCHROMIC     GLUCOSE, POC    Collection Time: 04/08/21 11:38 AM   Result Value Ref Range    Glucose (POC) 115 (H) 70 - 110 mg/dL   AMMONIA    Collection Time: 04/08/21  3:13 PM   Result Value Ref Range    Ammonia, plasma 37 (H) 11 - 32 UMOL/L   GLUCOSE, POC    Collection Time: 04/08/21  4:00 PM   Result Value Ref Range    Glucose (POC) 94 70 - 110 mg/dL   GLUCOSE, POC    Collection Time: 04/08/21 10:10 PM   Result Value Ref Range    Glucose (POC) 91 70 - 110 mg/dL       Signed By: Henrietta Hoover, DO     April 08, 2021      Disclaimer: Sections of this note are dictated using utilizing voice recognition software.  Minor typographical errors may be present. If questions arise, please do not  hesitate to contact me or call our department.

## 2021-04-09 LAB — CBC WITH AUTO DIFFERENTIAL
Basophils %: 2 % (ref 0–2)
Basophils Absolute: 0.4 10*3/uL — ABNORMAL HIGH (ref 0.0–0.1)
Eosinophils %: 2 % (ref 0–5)
Eosinophils Absolute: 0.4 10*3/uL (ref 0.0–0.4)
Granulocyte Absolute Count: 0 10*3/uL
Hematocrit: 29.5 % — ABNORMAL LOW (ref 36.0–48.0)
Hemoglobin: 9.8 g/dL — ABNORMAL LOW (ref 13.0–16.0)
Immature Granulocytes: 0 %
Lymphocytes %: 8 % — ABNORMAL LOW (ref 21–52)
Lymphocytes Absolute: 1.7 10*3/uL (ref 0.9–3.6)
MCH: 31.1 PG (ref 24.0–34.0)
MCHC: 33.2 g/dL (ref 31.0–37.0)
MCV: 93.7 FL (ref 78.0–100.0)
MPV: 9.8 FL (ref 9.2–11.8)
Monocytes %: 2 % — ABNORMAL LOW (ref 3–10)
Monocytes Absolute: 0.4 10*3/uL (ref 0.05–1.2)
NRBC Absolute: 0 10*3/uL (ref 0.00–0.01)
Neutrophils %: 86 % — ABNORMAL HIGH (ref 40–73)
Neutrophils Absolute: 18.2 10*3/uL — ABNORMAL HIGH (ref 1.8–8.0)
Nucleated RBCs: 0 PER 100 WBC
Platelet Comment: ADEQUATE
Platelets: 533 10*3/uL — ABNORMAL HIGH (ref 135–420)
RBC: 3.15 M/uL — ABNORMAL LOW (ref 4.35–5.65)
RDW: 12.1 % (ref 11.6–14.5)
WBC: 21.1 10*3/uL — ABNORMAL HIGH (ref 4.6–13.2)

## 2021-04-09 LAB — COMPREHENSIVE METABOLIC PANEL
ALT: 24 U/L (ref 16–61)
AST: 16 U/L (ref 10–38)
Albumin/Globulin Ratio: 0.5 — ABNORMAL LOW (ref 0.8–1.7)
Albumin: 2.3 g/dL — ABNORMAL LOW (ref 3.4–5.0)
Alkaline Phosphatase: 80 U/L (ref 45–117)
Anion Gap: 8 mmol/L (ref 3.0–18)
BUN: 55 MG/DL — ABNORMAL HIGH (ref 7.0–18)
Bun/Cre Ratio: 8 — ABNORMAL LOW (ref 12–20)
CO2: 24 mmol/L (ref 21–32)
Calcium: 8.6 MG/DL (ref 8.5–10.1)
Chloride: 112 mmol/L — ABNORMAL HIGH (ref 100–111)
Creatinine: 6.69 MG/DL — ABNORMAL HIGH (ref 0.6–1.3)
EGFR IF NonAfrican American: 9 mL/min/{1.73_m2} — ABNORMAL LOW (ref >60)
GFR African American: 11 mL/min/{1.73_m2} — ABNORMAL LOW (ref >60)
Globulin: 4.3 g/dL — ABNORMAL HIGH (ref 2.0–4.0)
Glucose: 84 mg/dL (ref 74–99)
Potassium: 4.6 mmol/L (ref 3.5–5.5)
Sodium: 144 mmol/L (ref 136–145)
Total Bilirubin: 0.3 MG/DL (ref 0.2–1.0)
Total Protein: 6.6 g/dL (ref 6.4–8.2)

## 2021-04-09 LAB — COVID-19, RAPID: SARS-CoV-2, Rapid: NOT DETECTED

## 2021-04-09 LAB — CULTURE, BLOOD 1: Culture: NO GROWTH

## 2021-04-09 LAB — POCT GLUCOSE
POC Glucose: 91 mg/dL (ref 70–110)
POC Glucose: 87 mg/dL (ref 70–110)
POC Glucose: 115 mg/dL — ABNORMAL HIGH (ref 70–110)
POC Glucose: 88 mg/dL (ref 70–110)

## 2021-04-09 LAB — METABOLIC PANEL, COMPREHENSIVE
A-G Ratio: 0.5 — ABNORMAL LOW (ref 0.8–1.7)
ALT (SGPT): 24 U/L (ref 16–61)
AST (SGOT): 16 U/L (ref 10–38)
Albumin: 2.3 g/dL — ABNORMAL LOW (ref 3.4–5.0)
Alk. phosphatase: 80 U/L (ref 45–117)
Anion gap: 8 mmol/L (ref 3.0–18)
BUN/Creatinine ratio: 8 — ABNORMAL LOW (ref 12–20)
BUN: 55 MG/DL — ABNORMAL HIGH (ref 7.0–18)
Bilirubin, total: 0.3 MG/DL (ref 0.2–1.0)
CO2: 24 mmol/L (ref 21–32)
Calcium: 8.6 MG/DL (ref 8.5–10.1)
Chloride: 112 mmol/L — ABNORMAL HIGH (ref 100–111)
Creatinine: 6.69 MG/DL — ABNORMAL HIGH (ref 0.6–1.3)
GFR est AA: 11 mL/min/{1.73_m2} — ABNORMAL LOW (ref >60)
GFR est non-AA: 9 mL/min/{1.73_m2} — ABNORMAL LOW (ref >60)
Globulin: 4.3 g/dL — ABNORMAL HIGH (ref 2.0–4.0)
Glucose: 84 mg/dL (ref 74–99)
Potassium: 4.6 mmol/L (ref 3.5–5.5)
Protein, total: 6.6 g/dL (ref 6.4–8.2)
Sodium: 144 mmol/L (ref 136–145)

## 2021-04-09 LAB — CBC WITH AUTOMATED DIFF
ABS. BASOPHILS: 0.4 10*3/uL — ABNORMAL HIGH (ref 0.0–0.1)
ABS. EOSINOPHILS: 0.4 10*3/uL (ref 0.0–0.4)
ABS. IMM. GRANS.: 0 10*3/uL
ABS. LYMPHOCYTES: 1.7 10*3/uL (ref 0.9–3.6)
ABS. MONOCYTES: 0.4 10*3/uL (ref 0.05–1.2)
ABS. NEUTROPHILS: 18.2 10*3/uL — ABNORMAL HIGH (ref 1.8–8.0)
ABSOLUTE NRBC: 0 10*3/uL (ref 0.00–0.01)
BASOPHILS: 2 % (ref 0–2)
EOSINOPHILS: 2 % (ref 0–5)
HCT: 29.5 % — ABNORMAL LOW (ref 36.0–48.0)
HGB: 9.8 g/dL — ABNORMAL LOW (ref 13.0–16.0)
IMMATURE GRANULOCYTES: 0 %
LYMPHOCYTES: 8 % — ABNORMAL LOW (ref 21–52)
MCH: 31.1 PG (ref 24.0–34.0)
MCHC: 33.2 g/dL (ref 31.0–37.0)
MCV: 93.7 FL (ref 78.0–100.0)
MONOCYTES: 2 % — ABNORMAL LOW (ref 3–10)
MPV: 9.8 FL (ref 9.2–11.8)
NEUTROPHILS: 86 % — ABNORMAL HIGH (ref 40–73)
NRBC: 0 PER 100 WBC
PLATELET COMMENTS: ADEQUATE
PLATELET: 533 10*3/uL — ABNORMAL HIGH (ref 135–420)
RBC: 3.15 M/uL — ABNORMAL LOW (ref 4.35–5.65)
RDW: 12.1 % (ref 11.6–14.5)
WBC: 21.1 10*3/uL — ABNORMAL HIGH (ref 4.6–13.2)

## 2021-04-09 LAB — COVID-19 RAPID TEST: COVID-19 rapid test: NOT DETECTED

## 2021-04-09 LAB — CULTURE, BLOOD: Culture result:: NO GROWTH

## 2021-04-09 LAB — GLUCOSE, POC
Glucose (POC): 91 mg/dL (ref 70–110)
Glucose (POC): 87 mg/dL (ref 70–110)
Glucose (POC): 115 mg/dL — ABNORMAL HIGH (ref 70–110)
Glucose (POC): 88 mg/dL (ref 70–110)

## 2021-04-09 MED ORDER — LEVOFLOXACIN 250 MG TAB
250 mg | ORAL_TABLET | ORAL | 0 refills | Status: AC
Start: 2021-04-09 — End: 2021-04-15

## 2021-04-09 MED ORDER — HYDRALAZINE 100 MG TAB
100 mg | ORAL_TABLET | Freq: Three times a day (TID) | ORAL | 0 refills | Status: AC
Start: 2021-04-09 — End: ?

## 2021-04-09 MED FILL — ESCITALOPRAM 10 MG TAB: 10 mg | ORAL | Qty: 1

## 2021-04-09 MED FILL — LEVETIRACETAM 500 MG/5 ML IV SOLN: 500 mg/5 mL | INTRAVENOUS | Qty: 5

## 2021-04-09 MED FILL — PANTOPRAZOLE 40 MG TAB, DELAYED RELEASE: 40 mg | ORAL | Qty: 1

## 2021-04-09 MED FILL — ROSUVASTATIN 10 MG TAB: 10 mg | ORAL | Qty: 1

## 2021-04-09 MED FILL — HYDRALAZINE 50 MG TAB: 50 mg | ORAL | Qty: 2

## 2021-04-09 MED FILL — CEFTRIAXONE 2 GRAM SOLUTION FOR INJECTION: 2 gram | INTRAMUSCULAR | Qty: 2

## 2021-04-09 MED FILL — AMLODIPINE 10 MG TAB: 10 mg | ORAL | Qty: 1

## 2021-04-09 MED FILL — METOPROLOL TARTRATE 50 MG TAB: 50 mg | ORAL | Qty: 1

## 2021-04-09 MED FILL — THIAMINE HCL 100 MG TAB: 100 mg | ORAL | Qty: 1

## 2021-04-09 MED FILL — CYANOCOBALAMIN (VITAMIN B-12) 2,500 MCG SUBLINGUAL TAB: 2500 mcg | SUBLINGUAL | Qty: 1

## 2021-04-09 NOTE — Progress Notes (Signed)
Infectious Disease progress Note        Reason: sepsis, complicated UTI    Current abx Prior abx   Cefepime, vancomycin  8/26-8/29  Ceftriaxone since 8/29 Ceftriaxone 8/25     Lines:       Assessment :    50 y.o. male with a PMHx of Intracranial Hemorrhage, CKD stage 4, metabolic encephalopathy, depression, HTN, GERD, hyperlipidemia, seizure disorder, T2DM and Vit D deficiency who [facility] on 04/03/21 for evaluation of complicated urinary tract infection.    Clinical presentation consistent with sepsis-present on admission due to complicated UTI, left kidney pyelonephritis/hydronephrosis    Status post cystoscopy, left ureteral stent placement on 04/03/2021  Intra-Op cultures 8/25-E. Coli      Altered mentation-likely metabolic encephalopathy.    Acute on chronic kidney disease-likely due to obstructive uropathy, sepsis    Antibiotic management complicated due to documented history of penicillin allergy- tolerated cefepime, ceftriaxone without difficulties    Persistent leukocytosis despite clinical improvement could be leukemoid reaction to acute kidney injury  Improved mentation.  Improved left CVA tenderness argues against worsening infection    Recommendations:    Continue ceftriaxone  Follow-up urology/nephrology recommendations  Monitor CBC, temperature, clinically to determine timing of switch to oral antibiotics    Above plan was discussed in details with patient. Please call me if any further questions or concerns. Will continue to participate in the care of this patient.    HPI:    Feels better.  Denies nausea, worsening left flank pain.  Does not answer all questions asked.        Past Medical History:   Diagnosis Date    CKD (chronic kidney disease) stage 4, GFR 15-29 ml/min (HCC) 04/03/2021    Depression 04/03/2021    Essential hypertension 04/03/2021    GERD without esophagitis 04/03/2021    Intracerebral hemorrhage (HCC) 02/24/2021    Intracerebral hemorrhage, nontraumatic (HCC)      Metabolic encephalopathy 04/03/2021    Mixed hyperlipidemia 04/03/2021    Obesity 04/03/2021    Seizure disorder (HCC) 04/03/2021    Type 2 diabetes mellitus (HCC) 04/03/2021    Vitamin D deficiency 04/03/2021       History reviewed. No pertinent surgical history.    home Medication List         Details   hydrALAZINE (APRESOLINE) 50 mg tablet Take 50 mg by mouth three (3) times daily.      amLODIPine (NORVASC) 10 mg tablet Take 1 Tablet by mouth in the morning.  Qty: 30 Tablet, Refills: 0      cyanocobalamin (VITAMIN B12) 2,500 mcg sublingual tablet Take 1 Tablet by mouth in the morning.  Qty: 30 Tablet, Refills: 0      ergocalciferol (ERGOCALCIFEROL) 1,250 mcg (50,000 unit) capsule Take 1 Capsule by mouth every seven (7) days.  Qty: 4 Capsule, Refills: 0      escitalopram oxalate (LEXAPRO) 5 mg tablet Take 1 Tablet by mouth every evening.  Qty: 30 Tablet, Refills: 0      levETIRAcetam (KEPPRA) 500 mg tablet Take 1 Tablet by mouth two (2) times a day.  Qty: 60 Tablet, Refills: 0      metoprolol tartrate (LOPRESSOR) 50 mg tablet Take 1 Tablet by mouth two (2) times a day.  Qty: 60 Tablet, Refills: 0      pantoprazole (PROTONIX) 40 mg tablet Take 1 Tablet by mouth Daily (before breakfast).  Qty: 30 Tablet, Refills: 0      rosuvastatin (  CRESTOR) 10 mg tablet Take 1 Tablet by mouth nightly.  Qty: 30 Tablet, Refills: 0      thiamine HCL (B-1) 100 mg tablet Take 1 Tablet by mouth in the morning.  Qty: 30 Tablet, Refills: 0             Current Facility-Administered Medications   Medication Dose Route Frequency    lactated Ringers infusion  75 mL/hr IntraVENous CONTINUOUS    cefTRIAXone (ROCEPHIN) 2 g in sterile water (preservative free) 20 mL IV syringe  2 g IntraVENous Q24H    hydrALAZINE (APRESOLINE) tablet 100 mg  100 mg Oral TID    amLODIPine (NORVASC) tablet 10 mg  10 mg Oral DAILY    insulin lispro (HUMALOG) injection   SubCUTAneous AC&HS    glucose chewable tablet 16 g  4 Tablet Oral PRN    glucagon (GLUCAGEN) injection 1  mg  1 mg IntraMUSCular PRN    dextrose 10% infusion 0-250 mL  0-250 mL IntraVENous PRN    pantoprazole (PROTONIX) tablet 40 mg  40 mg Oral ACB    rosuvastatin (CRESTOR) tablet 10 mg  10 mg Oral QHS    sodium chloride (NS) flush 5-40 mL  5-40 mL IntraVENous Q8H    sodium chloride (NS) flush 5-40 mL  5-40 mL IntraVENous PRN    acetaminophen (TYLENOL) tablet 650 mg  650 mg Oral Q6H PRN    Or    acetaminophen (TYLENOL) suppository 650 mg  650 mg Rectal Q6H PRN    polyethylene glycol (MIRALAX) packet 17 g  17 g Oral DAILY PRN    ondansetron (ZOFRAN ODT) tablet 4 mg  4 mg Oral Q8H PRN    Or    ondansetron (ZOFRAN) injection 4 mg  4 mg IntraVENous Q6H PRN    metoprolol tartrate (LOPRESSOR) tablet 50 mg  50 mg Oral BID    levETIRAcetam (KEPPRA) 500 mg in 0.9% sodium chloride (MBP/ADV) 100 mL MBP  500 mg IntraVENous Q12H    escitalopram oxalate (LEXAPRO) tablet 5 mg  5 mg Oral QPM    thiamine HCL (B-1) tablet 100 mg  100 mg Oral DAILY    cyanocobalamin (VITAMIN B12) sublingual tablet 2,500 mcg  2,500 mcg Oral DAILY       Allergies: Amoxicillin and Penicillin g    History reviewed. No pertinent family history.  Social History     Socioeconomic History    Marital status: SINGLE     Spouse name: Not on file    Number of children: Not on file    Years of education: Not on file    Highest education level: Not on file   Occupational History    Not on file   Tobacco Use    Smoking status: Not on file    Smokeless tobacco: Not on file   Substance and Sexual Activity    Alcohol use: Not on file    Drug use: Not on file    Sexual activity: Not on file   Other Topics Concern    Not on file   Social History Narrative    Not on file     Social Determinants of Health     Financial Resource Strain: Not on file   Food Insecurity: Not on file   Transportation Needs: Not on file   Physical Activity: Not on file   Stress: Not on file   Social Connections: Not on file   Intimate Partner Violence: Not on file   Housing Stability: Not  on file      Social History     Tobacco Use   Smoking Status Not on file   Smokeless Tobacco Not on file        Temp (24hrs), Avg:98.4 ??F (36.9 ??C), Min:97.7 ??F (36.5 ??C), Max:99 ??F (37.2 ??C)    Visit Vitals  BP (!) 171/76 (BP 1 Location: Left upper arm, BP Patient Position: At rest;Lying)   Pulse 75   Temp 97.7 ??F (36.5 ??C)   Resp 16   Ht 5\' 3"  (1.6 m)   Wt 111.1 kg (245 lb)   SpO2 94%   BMI 43.40 kg/m??       ROS: unable to obtain due to patient factors    Physical Exam:    General: Well developed, well nourished male sitting on the bed AAOx3 in no acute distress.    General:   awake alert and oriented   HEENT:  Normocephalic, atraumatic,EOMI, no scleral icterus or pallor; no conjunctival hemmohage;  nasal and oral mucous are moist and without evidence of lesions. No thrush. Dentition good. Neck supple, no bruits.   Lymph Nodes:   no cervical, axillary or inguinal adenopathy   Lungs:   non-labored, bilateral chest movements equal, no audible wheezing   Heart:  RRR, s1 and s2; no  rubs or gallops   Abdomen:  soft, non-distended, active bowel sounds, no hepatomegaly, no splenomegaly. Non-tender   Genitourinary: Foley in place   Extremities:   no clubbing, cyanosis; no joint effusions or swelling; Full ROM of all large joints to the upper and lower extremities; muscle mass appropriate for age   Neurologic:  Does not follow commands.  Moves all 4 extremities.  Detailed neurologic evaluation not feasible                        Skin:  No rash or ulcers noted   Back:  no spinal or paraspinal muscle tenderness or rigidity, decreased left CVA tenderness     Psychiatric:  Unable to assess         Labs: Results:   Chemistry Recent Labs     04/09/21  0237 04/08/21  0118 04/07/21  1000   GLU 84 116* 87   NA 144 144 145   K 4.6 4.2 4.3   CL 112* 112* 113*   CO2 24 26 26    BUN 55* 60* 64*   CREA 6.69* 6.55* 6.82*   CA 8.6 8.7 8.8   AGAP 8 6 6    BUCR 8* 9* 9*   AP 80  --   --    TP 6.6  --   --    ALB 2.3* 2.2* 2.2*   GLOB 4.3*  --   --     AGRAT 0.5*  --   --         CBC w/Diff Recent Labs     04/09/21  0237 04/08/21  0927   WBC 21.1* 21.1*   RBC 3.15* 3.30*   HGB 9.8* 9.9*   HCT 29.5* 30.6*   PLT 533* 553*   GRANS 86* 82*   LYMPH 8* 11*   EOS 2 3        Microbiology No results for input(s): CULT in the last 72 hours.       RADIOLOGY:    All available imaging studies/reports in connect care for this admission were reviewed        Disclaimer: Sections of this note are  dictated utilizing voice recognition software, which may have resulted in some phonetic based errors in grammar and contents. Even though attempts were made to correct all the mistakes, some may have been missed, and remained in the body of the document. If questions arise, please contact our department.    Dr. Raiford Simmonds, Infectious Disease Specialist  [phone]  April 09, 2021  9:01 AM

## 2021-04-09 NOTE — Progress Notes (Signed)
[facility] Hospitalist Group  Progress Note    Patient: Thomas Jackson Age: 50 y.o. DOB: 1971-05-11 MR#: [id number] SSN: [id number]  Date/Time: 04/09/2021     Subjective: Patient lying in bed. Looks much better than yesterday. Fully coherent and pleasant.      Assessment/Plan:     Hospital Problems  Date Reviewed: 14-Apr-2021            Codes Class Noted POA    Obstructive uropathy ICD-10-CM: N13.9  ICD-9-CM: 599.60  04-14-2021 Unknown        CKD (chronic kidney disease) stage 4, GFR 15-29 ml/min (HCC) ICD-10-CM: N18.4  ICD-9-CM: 585.4  Apr 14, 2021 Yes        Seizure disorder ([place]) ICD-10-CM: G40.909  ICD-9-CM: 345.90  04-14-2021 Yes        Type 2 diabetes mellitus ([place]) ICD-10-CM: E11.9  ICD-9-CM: 250.00  April 14, 2021 Yes        Metabolic encephalopathy OIN-86-VE: G93.41  ICD-9-CM: 348.31  April 14, 2021 Yes        Depression ICD-10-CM: F32.A  ICD-9-CM: 720  2021-04-14 Yes        Essential hypertension ICD-10-CM: I10  ICD-9-CM: 401.9  Apr 14, 2021 Yes        Mixed hyperlipidemia ICD-10-CM: E78.2  ICD-9-CM: 272.2  04/14/2021 Yes        GERD without esophagitis ICD-10-CM: K21.9  ICD-9-CM: 530.81  Apr 14, 2021 Yes        Vitamin D deficiency ICD-10-CM: E55.9  ICD-9-CM: 268.9  2021-04-14 Yes        Obesity ICD-10-CM: E66.9  ICD-9-CM: 278.00  April 14, 2021 Yes        Anemia ICD-10-CM: D64.9  ICD-9-CM: 285.9  04/14/2021 Yes        UTI (urinary tract infection) ICD-10-CM: N39.0  ICD-9-CM: 599.0  04/14/2021 Yes        AKI (acute kidney injury) ([place]) ICD-10-CM: N17.9  ICD-9-CM: 584.9  14-Apr-2021 Yes        Sepsis ([place]) ICD-10-CM: A41.9  ICD-9-CM: 038.9, 995.91  04/14/21 Yes        Intracerebral hemorrhage (HCC) ICD-10-CM: I61.9  ICD-9-CM: 947  02/24/2021 Yes       Sepsis POA (HR 096, WBC 28Z, complicated UTI)  Complicated UTI - Ecoli, pan-sensitive   Obstructive uropathy, left renal stone with hydronephrosis.  Status post cystoscopy, stent placement by urology  AKI on CKD 4,   Metabolic acidosis, improved  Uremia  Hypernatremia,  decreased free water intake  Hypertension  Leukocytosis, trending down  Thrombocytosis, reactive  Normocytic anemia  History of intracranial hemorrhage, stable  Acute metabolic encephalopathy due to multifactorial including sepsis, uremia, hypernatremia,  Seizure disorder, stable  Type 2 diabetes mellitus      Plan:  Urine culture growing EColi. On ceftriaxone.   ID following  Further plan per urology for definitive stone management  We will continue hydralazine, metoprolol and resume amlodipine, adjust based on the blood pressure  Continue IV fluids, off bicarb drip per nephrology  No need for HD currently  Continue Keppra  Continue PPI  PT/OT eval and treatment, recommend SNF placement    Discussed with the patient  Called and left a message to fianc??      Case discussed with:  '[x]'$ Patient  '[x]'$ Family  '[x]'$ Nursing  '[]'$ Case Management  DVT Prophylaxis:  '[]'$ Lovenox  '[]'$ Hep SQ  '[x]'$ SCDs  '[]'$ Coumadin   '[]'$ Eliquis/Xarelto     Objective:   VS: Visit Vitals  BP (!) 172/77 (BP 1 Location: Left lower arm, BP Patient Position: At rest)  Pulse 77   Temp 98.4 ??F (36.9 ??C)   Resp 16   Ht '5\' 3"'$  (1.6 m)   Wt 111.1 kg (245 lb)   SpO2 95%   BMI 43.40 kg/m??        Tmax/24hrs: Temp (24hrs), Avg:98 ??F (36.7 ??C), Min:97.3 ??F (36.3 ??C), Max:98.6 ??F (37 ??C)  IOBRIEF  Intake/Output Summary (Last 24 hours) at 04/09/2021 2242  Last data filed at 04/09/2021 1743  Gross per 24 hour   Intake 624 ml   Output 2550 ml   Net -1926 ml         General:  Alert, cooperative, no acute distress    HEENT: PERRLA, anicteric sclerae.  Pulmonary:  CTA Bilaterally. No Wheezing/Rales.  Cardiovascular: Regular rate and Rhythm.  GI:  Soft, Non distended, Non tender. + Bowel sounds.  Extremities:  No edema. No calf tenderness.   Psych: Not anxious or agitated.  Neurologic: Alert awake, receptive aphasia, follows commands inconsistently, moves left side better than right side.  Additional:    Medications:   Current Facility-Administered Medications   Medication Dose Route  Frequency    lactated Ringers infusion  75 mL/hr IntraVENous CONTINUOUS    cefTRIAXone (ROCEPHIN) 2 g in sterile water (preservative free) 20 mL IV syringe  2 g IntraVENous Q24H    hydrALAZINE (APRESOLINE) tablet 100 mg  100 mg Oral TID    amLODIPine (NORVASC) tablet 10 mg  10 mg Oral DAILY    insulin lispro (HUMALOG) injection   SubCUTAneous AC&HS    glucose chewable tablet 16 g  4 Tablet Oral PRN    glucagon (GLUCAGEN) injection 1 mg  1 mg IntraMUSCular PRN    dextrose 10% infusion 0-250 mL  0-250 mL IntraVENous PRN    pantoprazole (PROTONIX) tablet 40 mg  40 mg Oral ACB    rosuvastatin (CRESTOR) tablet 10 mg  10 mg Oral QHS    sodium chloride (NS) flush 5-40 mL  5-40 mL IntraVENous Q8H    sodium chloride (NS) flush 5-40 mL  5-40 mL IntraVENous PRN    acetaminophen (TYLENOL) tablet 650 mg  650 mg Oral Q6H PRN    Or    acetaminophen (TYLENOL) suppository 650 mg  650 mg Rectal Q6H PRN    polyethylene glycol (MIRALAX) packet 17 g  17 g Oral DAILY PRN    ondansetron (ZOFRAN ODT) tablet 4 mg  4 mg Oral Q8H PRN    Or    ondansetron (ZOFRAN) injection 4 mg  4 mg IntraVENous Q6H PRN    metoprolol tartrate (LOPRESSOR) tablet 50 mg  50 mg Oral BID    levETIRAcetam (KEPPRA) 500 mg in 0.9% sodium chloride (MBP/ADV) 100 mL MBP  500 mg IntraVENous Q12H    escitalopram oxalate (LEXAPRO) tablet 5 mg  5 mg Oral QPM    thiamine HCL (B-1) tablet 100 mg  100 mg Oral DAILY    cyanocobalamin (VITAMIN B12) sublingual tablet 2,500 mcg  2,500 mcg Oral DAILY       Labs:    Recent Results (from the past 24 hour(s))   CBC WITH AUTOMATED DIFF    Collection Time: 04/09/21  2:37 AM   Result Value Ref Range    WBC 21.1 (H) 4.6 - 13.2 K/uL    RBC 3.15 (L) 4.35 - 5.65 M/uL    HGB 9.8 (L) 13.0 - 16.0 g/dL    HCT 29.5 (L) 36.0 - 48.0 %    MCV 93.7 78.0 - 100.0 FL  MCH 31.1 24.0 - 34.0 PG    MCHC 33.2 31.0 - 37.0 g/dL    RDW 12.1 11.6 - 14.5 %    PLATELET 533 (H) 135 - 420 K/uL    MPV 9.8 9.2 - 11.8 FL    NRBC 0.0 0 PER 100 WBC    ABSOLUTE NRBC  0.00 0.00 - 0.01 K/uL    NEUTROPHILS 86 (H) 40 - 73 %    LYMPHOCYTES 8 (L) 21 - 52 %    MONOCYTES 2 (L) 3 - 10 %    EOSINOPHILS 2 0 - 5 %    BASOPHILS 2 0 - 2 %    IMMATURE GRANULOCYTES 0 %    ABS. NEUTROPHILS 18.2 (H) 1.8 - 8.0 K/UL    ABS. LYMPHOCYTES 1.7 0.9 - 3.6 K/UL    ABS. MONOCYTES 0.4 0.05 - 1.2 K/UL    ABS. EOSINOPHILS 0.4 0.0 - 0.4 K/UL    ABS. BASOPHILS 0.4 (H) 0.0 - 0.1 K/UL    ABS. IMM. GRANS. 0.0 K/UL    DF MANUAL      PLATELET COMMENTS ADEQUATE PLATELETS      RBC COMMENTS ANISOCYTOSIS  SLIGHT       METABOLIC PANEL, COMPREHENSIVE    Collection Time: 04/09/21  2:37 AM   Result Value Ref Range    Sodium 144 136 - 145 mmol/L    Potassium 4.6 3.5 - 5.5 mmol/L    Chloride 112 (H) 100 - 111 mmol/L    CO2 24 21 - 32 mmol/L    Anion gap 8 3.0 - 18 mmol/L    Glucose 84 74 - 99 mg/dL    BUN 55 (H) 7.0 - 18 MG/DL    Creatinine 6.69 (H) 0.6 - 1.3 MG/DL    BUN/Creatinine ratio 8 (L) 12 - 20      GFR est AA 11 (L) >60 ml/min/1.35m    GFR est non-AA 9 (L) >60 ml/min/1.750m   Calcium 8.6 8.5 - 10.1 MG/DL    Bilirubin, total 0.3 0.2 - 1.0 MG/DL    ALT (SGPT) 24 16 - 61 U/L    AST (SGOT) 16 10 - 38 U/L    Alk. phosphatase 80 45 - 117 U/L    Protein, total 6.6 6.4 - 8.2 g/dL    Albumin 2.3 (L) 3.4 - 5.0 g/dL    Globulin 4.3 (H) 2.0 - 4.0 g/dL    A-G Ratio 0.5 (L) 0.8 - 1.7     GLUCOSE, POC    Collection Time: 04/09/21  8:21 AM   Result Value Ref Range    Glucose (POC) 87 70 - 110 mg/dL   GLUCOSE, POC    Collection Time: 04/09/21 11:48 AM   Result Value Ref Range    Glucose (POC) 115 (H) 70 - 110 mg/dL   GLUCOSE, POC    Collection Time: 04/09/21  3:52 PM   Result Value Ref Range    Glucose (POC) 88 70 - 110 mg/dL   COVID-19 RAPID TEST    Collection Time: 04/09/21  6:45 PM   Result Value Ref Range    Specimen source Nasopharyngeal      COVID-19 rapid test Not detected NOTD     GLUCOSE, POC    Collection Time: 04/09/21  9:42 PM   Result Value Ref Range    Glucose (POC) 120 (H) 70 - 110 mg/dL       Signed By: BrFreddie ApleyDO     April 09, 2021      Disclaimer: Sections of this note are dictated using utilizing voice recognition software.  Minor typographical errors may be present. If questions arise, please do not hesitate to contact me or call our department.

## 2021-04-09 NOTE — Progress Notes (Signed)
Progress Notes by Georgiann Cocker, MD at 04/09/21 1741                Author: Georgiann Cocker, MD  Service: Nephrology  Author Type: Physician       Filed: 04/09/21 1741  Date of Service: 04/09/21 1741  Status: Signed          Editor: Georgiann Cocker, MD (Physician)                       In Patient Progress note         Admit Date: 04/03/2021        Impression:        1) AKI on CKD 4 ( baseline 3-4 creatinine ) d/t ATN in setting of sepsis    , some obst component causing AKI as well, renal functions improving    2) Concern for severe Septis ,Urosepsis d/t gm -ve rods urine cx    3) S/p cysto with stent placed   4) Left mild hydro with UPJ stone ,Urology following    5) Metabolic acidoses improving    6) Hypernatremia better   7) Altered mental status    8) h/o intracranial hemorrhage        Plan:   1) strict intake/output    2) continue LR @ 75 cc/hrs, encourage po intake    3) avoid IV contrast, nephrotoxins   4) renally dose AB    5) supportive care per primary team        discussed with nursing/primary team, Dr Margo Aye       Please call with questions,       Robyne Askew, MD FASN   Cell [phone]   Pager: [phone]          Subjective:        - more awake today     - No acute over night events.   - respiratory - stable   - hemodynamics - stable, no pressrs   - UOP-ok   - Nutrition -ok        Objective:        Visit Vitals      BP  (!) 176/79 (BP 1 Location: Left upper arm, BP Patient Position: At rest)     Pulse  74     Temp  97.9 ??F (36.6 ??C)     Resp  18     Ht  5\' 3"  (1.6 m)     Wt  111.1 kg (245 lb)     SpO2  94%        BMI  43.40 kg/m??              Intake/Output Summary (Last 24 hours) at 04/09/2021 1741   Last data filed at 04/09/2021 1308     Gross per 24 hour        Intake  600 ml        Output  2950 ml        Net  -2350 ml                Physical Exam:        Patient is in no apparent distress.    HEENT: mmm   Neck: no cervical lymphadenopathy or thyromegaly.    Lungs: good air entry, clear to  auscultation bilaterally.    Cardiovascular system: S1, S2, regular rate and rhythm.    Abdomen: soft, non tender, non distended.  Extremities: no clubbing, cyanosis or edema.    Integumentary: skin is grossly intact.              Data Review:        Recent Labs           04/09/21   0237     WBC  21.1*     RBC  3.15*     HCT  29.5*     MCV  93.7     MCH  31.1     MCHC  33.2        RDW  12.1             Recent Labs             04/09/21   0237  04/08/21   0118  04/07/21   1000     BUN  55*  60*  64*     CREA  6.69*  6.55*  6.82*     CA  8.6  8.7  8.8     ALB  2.3*  2.2*  2.2*     K  4.6  4.2  4.3     NA  144  144  145     CL  112*  112*  113*     CO2  24  26  26      PHOS   --   4.2  4.4          GLU  84  116*  87              , MD

## 2021-04-09 NOTE — Progress Notes (Signed)
 Problem: Dysphagia (Adult)  Goal: *Acute Goals and Plan of Care  Description: Patient will:  1. Tolerate PO trials with 0 s/s overt distress in 4/5 trials  2. Utilize compensatory swallow strategies/maneuvers (decrease bite/sip, size/rate, alt. liq/sol) with min cues in 4/5 trials  3. Perform oral-motor/laryngeal exercises to increase oropharyngeal swallow function with min cues  4. Complete an objective swallow study (i.e., MBSS) to assess swallow integrity, r/o aspiration, and determine of safest LRD, min A  5. Participate in Speech/language eval to maximize functional communication in current setting    Rec:     Soft/bite sized with thin liquids   Aspiration precautions  HOB >45 during po intake, remain >30 for 30-45 minutes after po   Small bites/sips; alternate liquid/solid with slow feeding rate   Oral care TID  Meds crushed    Outcome: Progressing Towards Goal    SPEECH LANGUAGE PATHOLOGY DYSPHAGIA TREATMENT    Patient: Thomas Jackson (50 y.o. male)  Date: 04/09/2021  Diagnosis: Obstructive uropathy [N13.9] <principal problem not specified>  Procedure(s) (LRB):  CYSTOSCOPY WITH LEFT RETROGRADES AND LEFT DOUBLE J STENT PLACEMENT/ C-ARM (Left) 6 Days Post-Op  Precautions: aspiration, Fall  PLOF:as per H&P     ASSESSMENT:  Pt seen at bedside for skilled ST follow-up for dysphagia. Confusion noted as he reported he hadn't eaten breakfast, though nurse states he had breakfast and ate majority of meal with no s/sx of aspiration. SLP presented PO trials with puree and thin liquids. Pt eager to drink and eat, demo consecutive swallows without any overt s/sx of aspiration. Laryngeal elevation was functional to palpation. SLP provided continued education of aspiration precautions (slow rate of PO, alternate sips/bites, small bites, HOB >45 degrees). Continue recommendation of soft solid diet with thin liquids, aspiration precautions, oral care TID, and meds as tolerated. D/w nurse.    Progression toward goals:  [x]           Improving appropriately and progressing toward goals  []          Improving slowly and progressing toward goals  []          Not making progress toward goals and plan of care will be adjusted     PLAN:  Recommendations and Planned Interventions:  See above.  Patient continues to benefit from skilled intervention to address the above impairments. Continue treatment per established plan of care.     SUBJECTIVE:   Patient stated "That tastes good".    OBJECTIVE:   Cognitive and Communication Status:  Neurologic State: Alert  Orientation Level: Oriented to person  Cognition: Follows commands  Perception: Appears intact  Perseveration: No perseveration noted  Safety/Judgement: Fall prevention  Dysphagia Treatment:  Oral Assessment:  Oral Assessment  Labial: Decreased seal  Dentition: Natural  Oral Hygiene: fair  Lingual: Incoordinated, Decreased strength, Decreased rate  Velum: Unable to visualize  Mandible: No impairment  Gag Reflex: No impairment  P.O. Trials:   Patient Position: 70   Vocal quality prior to P.O.: No impairment   Consistency Presented: Pudding, Thin liquid   How Presented: Self-fed/presented, Spoon, Straw   Bolus Acceptance: No impairment   Bolus Formation/Control: Impaired   Type of Impairment: Lip closure, Delayed, Incomplete, Mastication   Propulsion: Delayed (# of seconds)   Oral Residue: 10-50% of bolus   Initiation of Swallow: Delayed (# of seconds)   Laryngeal Elevation: Functional   Aspiration Signs/Symptoms: None   Pharyngeal Phase Characteristics: Easily fatigued    Effective Modifications: Alternate liquids/solids, Small sips and bites,  Other (comment)   Cues for Modifications: Moderate   Comments: Passed swallow screen test, no complication, no complaints   Oral Phase Severity: Moderate   Pharyngeal Phase Severity : Mild    PAIN:  Pain level pre-treatment: 0/10   Pain level post-treatment: 0/10     After treatment:   []               Patient left in no apparent distress sitting up in  chair  [x]               Patient left in no apparent distress in bed  [x]               Call bell left within reach  [x]               Nursing notified  []               Family present  []               Caregiver present  []               Bed alarm activated      COMMUNICATION/EDUCATION:   [x]  Aspiration precautions; swallow safety; compensatory techniques  []         Patient unable to participate in education; education ongoing with staff  []   Posted safety precautions in patient's room.  []  Oral-motor/laryngeal strengthening exercises    Thank you for this referral.    Thersia Cleverly, M.S., CCC-SLP  Speech-Language Pathologist    Time Calculation: 10 mins

## 2021-04-09 NOTE — Progress Notes (Signed)
Called [employer], wife, to assist with initial assessment and voice mail box is full. Will try again later.    Shella Maxim, BSN, RN  Pager # [phone]  Care Manager

## 2021-04-09 NOTE — Progress Notes (Signed)
Problem: Pressure Injury - Risk of  Goal: *Prevention of pressure injury  Description: Document Braden Scale and appropriate interventions in the flowsheet.  Outcome: Progressing Towards Goal  Note: Pressure Injury Interventions:  Sensory Interventions: Check visual cues for pain    Moisture Interventions: Absorbent underpads    Activity Interventions: Assess need for specialty bed    Mobility Interventions: Assess need for specialty bed    Nutrition Interventions: Document food/fluid/supplement intake    Friction and Shear Interventions: HOB 30 degrees or less                Problem: Falls - Risk of  Goal: *Absence of Falls  Description: Document Schmid Fall Risk and appropriate interventions in the flowsheet.  Outcome: Progressing Towards Goal  Note: Fall Risk Interventions:  Mobility Interventions: Patient to call before getting OOB    Mentation Interventions: Bed/chair exit alarm    Medication Interventions: Bed/chair exit alarm    Elimination Interventions: Call light in reach, Bed/chair exit alarm              Problem: Urinary Elimination - Impaired  Goal: *Absence/decrease in episodes of  urinary incontinence  Outcome: Progressing Towards Goal     Problem: Pain  Goal: *Control of Pain  Outcome: Progressing Towards Goal

## 2021-04-09 NOTE — Progress Notes (Addendum)
Reason for Admission:  Obstructive uropathy [N13.9]                 RUR Score:    17%            Plan for utilizing home health:    no                      Likelihood of Readmission:   Moderate                         Do you (patient/family) have any concerns for transition/discharge?  no    Transition of Care Plan:       Initial assessment completed with significant other via phone. Cognitive status of patient: oriented to person and place.    Face sheet information confirmed:  yes.  Wynonia Lawman, significant other provided needed information.     This patient lives in a single family home with girlfriend.  Patient is not able to navigate steps as needed.  Prior to hospitalization, patient was considered to be independent with ADLs/IADLS : no . If not independent,  patient needs assist with : dressing, bathing, food preparation, cooking, toileting, and grooming    Patient has a current ACP document on file: no    The patient's spouse will be available to transport patient home upon discharge.    The patient already has Dan Humphreys, Paediatric nurse, and  hospital bed in the home.     Patient is not currently active with home health.    Patient has stayed in a skilled nursing facility or rehab.  Was  stay within last 60 days : yes.  [facility] rehab.    This patient is on dialysis :no    Currently, the discharge plan is Acute Rehab.    Per Wynonia Lawman,  patient will return to [facility]. She will pick up patient at 12 noon tomorrow.    Spoke with Dulcy Fanny, admissions director, with [facility]. Per Emmi, patient can return to facility. Will need rapid COVID test. Faxed discharge summary to [phone].    Updated Dr. Margo Aye via perfectserve.     Patient's current insurance is Medicare Part A & B and Medicaid of [facility]      Care Management Interventions  PCP Verified by CM: Yes  Mode of Transport at Discharge: Other (see comment) (Significant other will  transport.)  Transition of Care Consult (CM Consult): Discharge Planning  Discharge Durable Medical Equipment: No  Physical Therapy Consult: Yes  Occupational Therapy Consult: Yes  Speech Therapy Consult: Yes  Support Systems: Spouse/Significant Other  Confirm Follow Up Transport: Family  Discharge Location  Patient Expects to be Discharged to:: Other: ([facility] rehab )    Readmission Assessment  Number of days since last admission?: 8-30 days  Who is being interviewed?:  (signicant other)  What was the patient's/caregiver's perception as to why they think they needed to return back to the hospital?:  (sent to Ed by facility)  Who advised the patient to return to the hospital?: Acute Rehab  In our efforts to provide the best possible care to you and others like you, can you think of anything that we could have done to help you after you left the hospital the first time, so that you might not have needed to return so soon?: Other (Comment) (unknown)     K. Diggs, BSN, RN  Pager # [phone]  Care Manager

## 2021-04-09 NOTE — Progress Notes (Signed)
Bedside shift change report given to Nyra,RN (oncoming nurse) by Evangeline Gula (offgoing nurse). Report included the following information SBAR, Kardex, MAR, and Recent Results.   Wound Prevention Checklist    Patient: Thomas Jackson (50 y.o. male)  Date: 04/09/2021  Diagnosis: Obstructive uropathy [N13.9] <principal problem not specified>    Precautions: Fall       []   Heel prevention boots placed on patient    []   Patient turned q2h during shift    []   Lift team ordered    []   Patient on [employer] bed/Specialty bed    []   Each Wound is documented during shift (Stage, Color, drainage, odor, measurements, and dressings)    [x]   Dual skin checks done at bedside during shift report with , RN

## 2021-04-10 ENCOUNTER — Inpatient Hospital Stay: Admit: 2021-04-10 | Payer: MEDICARE | Primary: Family

## 2021-04-10 LAB — CBC WITH AUTO DIFFERENTIAL
Basophils %: 1 % (ref 0–2)
Basophils Absolute: 0.2 10*3/uL — ABNORMAL HIGH (ref 0.0–0.1)
Eosinophils %: 5 % (ref 0–5)
Eosinophils Absolute: 1.1 10*3/uL — ABNORMAL HIGH (ref 0.0–0.4)
Granulocyte Absolute Count: 0 10*3/uL (ref 0.00–0.04)
Hematocrit: 32.2 % — ABNORMAL LOW (ref 36.0–48.0)
Hemoglobin: 10.6 g/dL — ABNORMAL LOW (ref 13.0–16.0)
Immature Granulocytes: 0 % (ref 0.0–0.5)
Lymphocytes %: 11 % — ABNORMAL LOW (ref 21–52)
Lymphocytes Absolute: 2.5 10*3/uL (ref 0.9–3.6)
MCH: 31.6 PG (ref 24.0–34.0)
MCHC: 32.9 g/dL (ref 31.0–37.0)
MCV: 96.1 FL (ref 78.0–100.0)
MPV: 9.9 FL (ref 9.2–11.8)
Monocytes %: 7 % (ref 3–10)
Monocytes Absolute: 1.6 10*3/uL — ABNORMAL HIGH (ref 0.05–1.2)
NRBC Absolute: 0 10*3/uL (ref 0.00–0.01)
Neutrophils %: 76 % — ABNORMAL HIGH (ref 40–73)
Neutrophils Absolute: 16.9 10*3/uL — ABNORMAL HIGH (ref 1.8–8.0)
Nucleated RBCs: 0 PER 100 WBC
Platelet Comment: INCREASED
Platelets: 516 10*3/uL — ABNORMAL HIGH (ref 135–420)
RBC: 3.35 M/uL — ABNORMAL LOW (ref 4.35–5.65)
RDW: 12.1 % (ref 11.6–14.5)
WBC: 22.3 10*3/uL — ABNORMAL HIGH (ref 4.6–13.2)

## 2021-04-10 LAB — COMPREHENSIVE METABOLIC PANEL
ALT: 36 U/L (ref 16–61)
AST: 28 U/L (ref 10–38)
Albumin/Globulin Ratio: 0.6 — ABNORMAL LOW (ref 0.8–1.7)
Albumin: 2.6 g/dL — ABNORMAL LOW (ref 3.4–5.0)
Alkaline Phosphatase: 86 U/L (ref 45–117)
Anion Gap: 7 mmol/L (ref 3.0–18)
BUN: 54 MG/DL — ABNORMAL HIGH (ref 7.0–18)
Bun/Cre Ratio: 9 — ABNORMAL LOW (ref 12–20)
CO2: 24 mmol/L (ref 21–32)
Calcium: 8.7 MG/DL (ref 8.5–10.1)
Chloride: 110 mmol/L (ref 100–111)
Creatinine: 6.13 MG/DL — ABNORMAL HIGH (ref 0.6–1.3)
EGFR IF NonAfrican American: 10 mL/min/{1.73_m2} — ABNORMAL LOW (ref >60)
GFR African American: 12 mL/min/{1.73_m2} — ABNORMAL LOW (ref >60)
Globulin: 4.3 g/dL — ABNORMAL HIGH (ref 2.0–4.0)
Glucose: 89 mg/dL (ref 74–99)
Potassium: 4.5 mmol/L (ref 3.5–5.5)
Sodium: 141 mmol/L (ref 136–145)
Total Bilirubin: 0.9 MG/DL (ref 0.2–1.0)
Total Protein: 6.9 g/dL (ref 6.4–8.2)

## 2021-04-10 LAB — POCT GLUCOSE
POC Glucose: 120 mg/dL — ABNORMAL HIGH (ref 70–110)
POC Glucose: 85 mg/dL (ref 70–110)
POC Glucose: 149 mg/dL — ABNORMAL HIGH (ref 70–110)
POC Glucose: 116 mg/dL — ABNORMAL HIGH (ref 70–110)

## 2021-04-10 LAB — METABOLIC PANEL, COMPREHENSIVE
A-G Ratio: 0.6 — ABNORMAL LOW (ref 0.8–1.7)
ALT (SGPT): 36 U/L (ref 16–61)
AST (SGOT): 28 U/L (ref 10–38)
Albumin: 2.6 g/dL — ABNORMAL LOW (ref 3.4–5.0)
Alk. phosphatase: 86 U/L (ref 45–117)
Anion gap: 7 mmol/L (ref 3.0–18)
BUN/Creatinine ratio: 9 — ABNORMAL LOW (ref 12–20)
BUN: 54 MG/DL — ABNORMAL HIGH (ref 7.0–18)
Bilirubin, total: 0.9 MG/DL (ref 0.2–1.0)
CO2: 24 mmol/L (ref 21–32)
Calcium: 8.7 MG/DL (ref 8.5–10.1)
Chloride: 110 mmol/L (ref 100–111)
Creatinine: 6.13 MG/DL — ABNORMAL HIGH (ref 0.6–1.3)
GFR est AA: 12 mL/min/{1.73_m2} — ABNORMAL LOW (ref >60)
GFR est non-AA: 10 mL/min/{1.73_m2} — ABNORMAL LOW (ref >60)
Globulin: 4.3 g/dL — ABNORMAL HIGH (ref 2.0–4.0)
Glucose: 89 mg/dL (ref 74–99)
Potassium: 4.5 mmol/L (ref 3.5–5.5)
Protein, total: 6.9 g/dL (ref 6.4–8.2)
Sodium: 141 mmol/L (ref 136–145)

## 2021-04-10 LAB — CBC WITH AUTOMATED DIFF
ABS. BASOPHILS: 0.2 10*3/uL — ABNORMAL HIGH (ref 0.0–0.1)
ABS. EOSINOPHILS: 1.1 10*3/uL — ABNORMAL HIGH (ref 0.0–0.4)
ABS. IMM. GRANS.: 0 10*3/uL (ref 0.00–0.04)
ABS. LYMPHOCYTES: 2.5 10*3/uL (ref 0.9–3.6)
ABS. MONOCYTES: 1.6 10*3/uL — ABNORMAL HIGH (ref 0.05–1.2)
ABS. NEUTROPHILS: 16.9 10*3/uL — ABNORMAL HIGH (ref 1.8–8.0)
ABSOLUTE NRBC: 0 10*3/uL (ref 0.00–0.01)
BASOPHILS: 1 % (ref 0–2)
EOSINOPHILS: 5 % (ref 0–5)
HCT: 32.2 % — ABNORMAL LOW (ref 36.0–48.0)
HGB: 10.6 g/dL — ABNORMAL LOW (ref 13.0–16.0)
IMMATURE GRANULOCYTES: 0 % (ref 0.0–0.5)
LYMPHOCYTES: 11 % — ABNORMAL LOW (ref 21–52)
MCH: 31.6 PG (ref 24.0–34.0)
MCHC: 32.9 g/dL (ref 31.0–37.0)
MCV: 96.1 FL (ref 78.0–100.0)
MONOCYTES: 7 % (ref 3–10)
MPV: 9.9 FL (ref 9.2–11.8)
NEUTROPHILS: 76 % — ABNORMAL HIGH (ref 40–73)
NRBC: 0 PER 100 WBC
PLATELET COMMENTS: INCREASED
PLATELET: 516 10*3/uL — ABNORMAL HIGH (ref 135–420)
RBC: 3.35 M/uL — ABNORMAL LOW (ref 4.35–5.65)
RDW: 12.1 % (ref 11.6–14.5)
WBC: 22.3 10*3/uL — ABNORMAL HIGH (ref 4.6–13.2)

## 2021-04-10 LAB — GLUCOSE, POC
Glucose (POC): 120 mg/dL — ABNORMAL HIGH (ref 70–110)
Glucose (POC): 85 mg/dL (ref 70–110)
Glucose (POC): 149 mg/dL — ABNORMAL HIGH (ref 70–110)
Glucose (POC): 116 mg/dL — ABNORMAL HIGH (ref 70–110)

## 2021-04-10 MED ORDER — WATER FOR INJECTION, STERILE INJECTION
INTRAMUSCULAR | Status: AC
Start: 2021-04-10 — End: 2021-04-11

## 2021-04-10 MED ORDER — SODIUM CHLORIDE 0.9 % IV PIGGY BACK
INTRAVENOUS | Status: AC
Start: 2021-04-10 — End: 2021-04-11
  Administered 2021-04-10: 23:00:00

## 2021-04-10 MED FILL — THIAMINE HCL 100 MG TAB: 100 mg | ORAL | Qty: 1

## 2021-04-10 MED FILL — HYDRALAZINE 50 MG TAB: 50 mg | ORAL | Qty: 2

## 2021-04-10 MED FILL — CEFTRIAXONE 2 GRAM SOLUTION FOR INJECTION: 2 gram | INTRAMUSCULAR | Qty: 2

## 2021-04-10 MED FILL — ROSUVASTATIN 10 MG TAB: 10 mg | ORAL | Qty: 1

## 2021-04-10 MED FILL — METOPROLOL TARTRATE 50 MG TAB: 50 mg | ORAL | Qty: 1

## 2021-04-10 MED FILL — WATER FOR INJECTION, STERILE INJECTION: INTRAMUSCULAR | Qty: 20

## 2021-04-10 MED FILL — ESCITALOPRAM 10 MG TAB: 10 mg | ORAL | Qty: 1

## 2021-04-10 MED FILL — LEVETIRACETAM 500 MG/5 ML IV SOLN: 500 mg/5 mL | INTRAVENOUS | Qty: 5

## 2021-04-10 MED FILL — PANTOPRAZOLE 40 MG TAB, DELAYED RELEASE: 40 mg | ORAL | Qty: 1

## 2021-04-10 MED FILL — AMLODIPINE 10 MG TAB: 10 mg | ORAL | Qty: 1

## 2021-04-10 MED FILL — SODIUM CHLORIDE 0.9 % IV PIGGY BACK: INTRAVENOUS | Qty: 100

## 2021-04-10 MED FILL — CYANOCOBALAMIN (VITAMIN B-12) 2,500 MCG SUBLINGUAL TAB: 2500 mcg | SUBLINGUAL | Qty: 1

## 2021-04-10 NOTE — Progress Notes (Signed)
Discharge planning    Discharge cancelled per MD due to needs results from [place]. Called Emmi Faunkum,[phone]) admissions coordinator, with [facility] to provide update on discharge. Ms Thomas Jackson to be called tomorrow in am to confirm discharge.     Shella Maxim, BSN, RN  Pager # [phone]  Care Manager

## 2021-04-10 NOTE — Progress Notes (Signed)
Infectious Disease progress Note        Reason: sepsis, complicated UTI    Current abx Prior abx   Cefepime, vancomycin  8/26-8/29  Ceftriaxone since 8/29 Ceftriaxone 8/25     Lines:       Assessment :    50 y.o. male with a PMHx of Intracranial Hemorrhage, CKD stage 4, metabolic encephalopathy, depression, HTN, GERD, hyperlipidemia, seizure disorder, T2DM and Vit D deficiency who presented [facility] 04/03[facility] for evaluation of complicated urinary tract infection.    Clinical presentation consistent with sepsis-present on admission due to complicated UTI, left kidney pyelonephritis/hydronephrosis    Status post cystoscopy, left ureteral stent placement on 04/03/2021  Intra-Op cultures 8/25-E. Coli      Altered mentation-likely metabolic encephalopathy.    Acute on chronic kidney disease-likely due to obstructive uropathy, sepsis    Antibiotic management complicated due to documented history of penicillin allergy- tolerated cefepime, ceftriaxone without difficulties    Persistent leukocytosis despite clinical improvement, improvement in renal function  Improved mentation.  Improved left CVA tenderness   Will obtain imaging studies to rule out complicated UTI, left renal abscess    Recommendations:    Continue ceftriaxone for now  Follow-up urology/nephrology recommendations  Obtain stat CBC, stat renal ultrasound  Plan to switch to p.o. levofloxacin if no evidence of renal abscess noted on renal ultrasound.    Above plan was discussed in details with patient, dr. Margo Aye, RN.  Please call me if any further questions or concerns. Will continue to participate in the care of this patient.    HPI:    Feels better.  Denies nausea, worsening left flank pain.  Does not answer all questions asked.        Past Medical History:   Diagnosis Date    CKD (chronic kidney disease) stage 4, GFR 15-29 ml/min (HCC) 04/03/2021    Depression 04/03/2021    Essential hypertension 04/03/2021    GERD without esophagitis 04/03/2021     Intracerebral hemorrhage (HCC) 02/24/2021    Intracerebral hemorrhage, nontraumatic (HCC)     Metabolic encephalopathy 04/03/2021    Mixed hyperlipidemia 04/03/2021    Obesity 04/03/2021    Seizure disorder (HCC) 04/03/2021    Type 2 diabetes mellitus (HCC) 04/03/2021    Vitamin D deficiency 04/03/2021       History reviewed. No pertinent surgical history.    home Medication List         Details   hydrALAZINE (APRESOLINE) 50 mg tablet Take 50 mg by mouth three (3) times daily.      amLODIPine (NORVASC) 10 mg tablet Take 1 Tablet by mouth in the morning.  Qty: 30 Tablet, Refills: 0      cyanocobalamin (VITAMIN B12) 2,500 mcg sublingual tablet Take 1 Tablet by mouth in the morning.  Qty: 30 Tablet, Refills: 0      ergocalciferol (ERGOCALCIFEROL) 1,250 mcg (50,000 unit) capsule Take 1 Capsule by mouth every seven (7) days.  Qty: 4 Capsule, Refills: 0      escitalopram oxalate (LEXAPRO) 5 mg tablet Take 1 Tablet by mouth every evening.  Qty: 30 Tablet, Refills: 0      levETIRAcetam (KEPPRA) 500 mg tablet Take 1 Tablet by mouth two (2) times a day.  Qty: 60 Tablet, Refills: 0      metoprolol tartrate (LOPRESSOR) 50 mg tablet Take 1 Tablet by mouth two (2) times a day.  Qty: 60 Tablet, Refills: 0      pantoprazole (PROTONIX)  40 mg tablet Take 1 Tablet by mouth Daily (before breakfast).  Qty: 30 Tablet, Refills: 0      rosuvastatin (CRESTOR) 10 mg tablet Take 1 Tablet by mouth nightly.  Qty: 30 Tablet, Refills: 0      thiamine HCL (B-1) 100 mg tablet Take 1 Tablet by mouth in the morning.  Qty: 30 Tablet, Refills: 0             Current Facility-Administered Medications   Medication Dose Route Frequency    lactated Ringers infusion  75 mL/hr IntraVENous CONTINUOUS    cefTRIAXone (ROCEPHIN) 2 g in sterile water (preservative free) 20 mL IV syringe  2 g IntraVENous Q24H    hydrALAZINE (APRESOLINE) tablet 100 mg  100 mg Oral TID    amLODIPine (NORVASC) tablet 10 mg  10 mg Oral DAILY    insulin lispro (HUMALOG) injection    SubCUTAneous AC&HS    glucose chewable tablet 16 g  4 Tablet Oral PRN    glucagon (GLUCAGEN) injection 1 mg  1 mg IntraMUSCular PRN    dextrose 10% infusion 0-250 mL  0-250 mL IntraVENous PRN    pantoprazole (PROTONIX) tablet 40 mg  40 mg Oral ACB    rosuvastatin (CRESTOR) tablet 10 mg  10 mg Oral QHS    sodium chloride (NS) flush 5-40 mL  5-40 mL IntraVENous Q8H    sodium chloride (NS) flush 5-40 mL  5-40 mL IntraVENous PRN    acetaminophen (TYLENOL) tablet 650 mg  650 mg Oral Q6H PRN    Or    acetaminophen (TYLENOL) suppository 650 mg  650 mg Rectal Q6H PRN    polyethylene glycol (MIRALAX) packet 17 g  17 g Oral DAILY PRN    ondansetron (ZOFRAN ODT) tablet 4 mg  4 mg Oral Q8H PRN    Or    ondansetron (ZOFRAN) injection 4 mg  4 mg IntraVENous Q6H PRN    metoprolol tartrate (LOPRESSOR) tablet 50 mg  50 mg Oral BID    levETIRAcetam (KEPPRA) 500 mg in 0.9% sodium chloride (MBP/ADV) 100 mL MBP  500 mg IntraVENous Q12H    escitalopram oxalate (LEXAPRO) tablet 5 mg  5 mg Oral QPM    thiamine HCL (B-1) tablet 100 mg  100 mg Oral DAILY    cyanocobalamin (VITAMIN B12) sublingual tablet 2,500 mcg  2,500 mcg Oral DAILY       Allergies: Amoxicillin and Penicillin g    History reviewed. No pertinent family history.  Social History     Socioeconomic History    Marital status: SINGLE     Spouse name: Not on file    Number of children: Not on file    Years of education: Not on file    Highest education level: Not on file   Occupational History    Not on file   Tobacco Use    Smoking status: Not on file    Smokeless tobacco: Not on file   Substance and Sexual Activity    Alcohol use: Not on file    Drug use: Not on file    Sexual activity: Not on file   Other Topics Concern    Not on file   Social History Narrative    Not on file     Social Determinants of Health     Financial Resource Strain: Not on file   Food Insecurity: Not on file   Transportation Needs: Not on file   Physical Activity: Not on file   Stress:  Not on file    Social Connections: Not on file   Intimate Partner Violence: Not on file   Housing Stability: Not on file     Social History     Tobacco Use   Smoking Status Not on file   Smokeless Tobacco Not on file        Temp (24hrs), Avg:98.2 ??F (36.8 ??C), Min:97.5 ??F (36.4 ??C), Max:99 ??F (37.2 ??C)    Visit Vitals  BP (!) 148/74 (BP 1 Location: Left upper arm, BP Patient Position: Semi fowlers;Lying)   Pulse 77   Temp 99 ??F (37.2 ??C)   Resp 18   Ht 5\' 3"  (1.6 m)   Wt 111.1 kg (245 lb)   SpO2 94%   BMI 43.40 kg/m??       ROS: unable to obtain due to patient factors    Physical Exam:    General: Well developed, well nourished male sitting on the bed AAOx3 in no acute distress.    General:   awake alert and oriented   HEENT:  Normocephalic, atraumatic,EOMI, no scleral icterus or pallor; no conjunctival hemmohage;  nasal and oral mucous are moist and without evidence of lesions. No thrush. Dentition good. Neck supple, no bruits.   Lymph Nodes:   no cervical, axillary or inguinal adenopathy   Lungs:   non-labored, bilateral chest movements equal, no audible wheezing   Heart:  RRR, s1 and s2; no  rubs or gallops   Abdomen:  soft, non-distended, active bowel sounds, no hepatomegaly, no splenomegaly. Non-tender   Genitourinary: Foley in place   Extremities:   no clubbing, cyanosis; no joint effusions or swelling; Full ROM of all large joints to the upper and lower extremities; muscle mass appropriate for age   Neurologic:  Does not follow commands.  Moves all 4 extremities.  Detailed neurologic evaluation not feasible                        Skin:  No rash or ulcers noted   Back:  no spinal or paraspinal muscle tenderness or rigidity, resolved left CVA tenderness     Psychiatric:  confused         Labs: Results:   Chemistry Recent Labs     04/10/21  0243 04/09/21  0237 04/08/21  0118   GLU 89 84 116*   NA 141 144 144   K 4.5 4.6 4.2   CL 110 112* 112*   CO2 24 24 26    BUN 54* 55* 60*   CREA 6.13* 6.69* 6.55*   CA 8.7 8.6 8.7   AGAP 7  8 6    BUCR 9* 8* 9*   AP 86 80  --    TP 6.9 6.6  --    ALB 2.6* 2.3* 2.2*   GLOB 4.3* 4.3*  --    AGRAT 0.6* 0.5*  --         CBC w/Diff Recent Labs     04/10/21  0243 04/09/21  0237 04/08/21  0927   WBC 22.3* 21.1* 21.1*   RBC 3.35* 3.15* 3.30*   HGB 10.6* 9.8* 9.9*   HCT 32.2* 29.5* 30.6*   PLT 516* 533* 553*   GRANS 76* 86* 82*   LYMPH 11* 8* 11*   EOS 5 2 3         Microbiology No results for input(s): CULT in the last 72 hours.       RADIOLOGY:    All  available imaging studies/reports in connect care for this admission were reviewed    Total time spent >35 minutes. High complexity decision making was performed during the evaluation of this patient at high risk for decompensation      Above mentioned total time spent on reviewing the case/medical record/data/notes/EMR/patient examination/documentation/coordinating care with nurse/consultants, exclusive of procedures with complex decision making performed and > 50% time spent in face to face evaluation.       Disclaimer: Sections of this note are dictated utilizing voice recognition software, which may have resulted in some phonetic based errors in grammar and contents. Even though attempts were made to correct all the mistakes, some may have been missed, and remained in the body of the document. If questions arise, please contact our department.    Dr. Raiford Simmonds, Infectious Disease Specialist  [phone]  April 10, 2021  9:01 AM

## 2021-04-10 NOTE — Progress Notes (Signed)
Progress Notes by Georgiann Cocker, MD at 04/10/21 1557                Author: Georgiann Cocker, MD  Service: Nephrology  Author Type: Physician       Filed: 04/10/21 1600  Date of Service: 04/10/21 1557  Status: Signed          Editor: Georgiann Cocker, MD (Physician)                       In Patient Progress note         Admit Date: 04/03/2021        Impression:        1) AKI on CKD 4 ( baseline 3-4 creatinine ) d/t ATN in setting of sepsis    , some obst component causing AKI as well, renal functions improving    2) Concern for severe Septis ,Urosepsis     3) S/p cysto with stent placed   4) Left mild hydro with UPJ stone ,Urology following    5) Metabolic acidoses improving    6) Hypernatremia better   7) Altered mental status    8) h/o intracranial hemorrhage        Plan:   1) strict intake/output    2) continue LR @ 50 cc/hrs, encourage po intake    3) avoid IV contrast, nephrotoxins   4) renally dose AB , per ID    5) supportive care per primary team        discussed with nursing/primary team, Dr Margo Aye    And Dr Carolanne Grumbling( ID)       Please call with questions,       Robyne Askew, MD FASN   Cell [phone]   Pager: [phone]          Subjective:        - awake and alert     - No acute over night events.   - respiratory - stable   - hemodynamics - stable, no pressrs   - UOP-ok   - Nutrition -ok        Objective:        Visit Vitals      BP  (!) 148/74 (BP 1 Location: Left upper arm, BP Patient Position: Semi fowlers;Lying)     Pulse  77     Temp  99 ??F (37.2 ??C)     Resp  18     Ht  5\' 3"  (1.6 m)     Wt  111.1 kg (245 lb)     SpO2  94%        BMI  43.40 kg/m??              Intake/Output Summary (Last 24 hours) at 04/10/2021 1557   Last data filed at 04/10/2021 1005     Gross per 24 hour        Intake  624 ml        Output  2700 ml        Net  -2076 ml                Physical Exam:        Patient is in no apparent distress.    HEENT: mmm   Neck: no cervical lymphadenopathy or thyromegaly.    Lungs: good air  entry, clear to auscultation bilaterally.    Cardiovascular system: S1, S2, regular rate and rhythm.  Abdomen: soft, non tender, non distended.     Extremities: no clubbing, cyanosis or edema.    Integumentary: skin is grossly intact.              Data Review:        Recent Labs           04/10/21   0243     WBC  22.3*     RBC  3.35*     HCT  32.2*     MCV  96.1     MCH  31.6     MCHC  32.9        RDW  12.1             Recent Labs             04/10/21   0243  04/09/21   0237  04/08/21   0118     BUN  54*  55*  60*     CREA  6.13*  6.69*  6.55*     CA  8.7  8.6  8.7     ALB  2.6*  2.3*  2.2*     K  4.5  4.6  4.2     NA  141  144  144     CL  110  112*  112*     CO2  24  24  26      PHOS   --    --   4.2          GLU  89  84  116*              , MD

## 2021-04-10 NOTE — Progress Notes (Signed)
Problem: Pressure Injury - Risk of  Goal: *Prevention of pressure injury  Description: Document Braden Scale and appropriate interventions in the flowsheet.  Outcome: Progressing Towards Goal  Note: Pressure Injury Interventions:  Sensory Interventions: Assess changes in LOC, Check visual cues for pain, Keep linens dry and wrinkle-free, Minimize linen layers    Moisture Interventions: Absorbent underpads, Apply protective barrier, creams and emollients, Check for incontinence Q2 hours and as needed, Maintain skin hydration (lotion/cream), Minimize layers, Moisture barrier    Activity Interventions: Assess need for specialty bed    Mobility Interventions: Assess need for specialty bed    Nutrition Interventions: Document food/fluid/supplement intake    Friction and Shear Interventions: Apply protective barrier, creams and emollients, Lift sheet, Minimize layers                Problem: Patient Education: Go to Patient Education Activity  Goal: Patient/Family Education  Outcome: Progressing Towards Goal     Problem: Falls - Risk of  Goal: *Absence of Falls  Description: Document Bridgette Habermann Fall Risk and appropriate interventions in the flowsheet.  Outcome: Progressing Towards Goal  Note: Fall Risk Interventions:  Mobility Interventions: Bed/chair exit alarm, Patient to call before getting OOB    Mentation Interventions: Bed/chair exit alarm, Door open when patient unattended, Room close to nurse's station, Update white board    Medication Interventions: Bed/chair exit alarm, Patient to call before getting OOB    Elimination Interventions: Bed/chair exit alarm, Call light in reach, Urinal in reach              Problem: Patient Education: Go to Patient Education Activity  Goal: Patient/Family Education  Outcome: Progressing Towards Goal     Problem: Patient Education: Go to Patient Education Activity  Goal: Patient/Family Education  Outcome: Progressing Towards Goal     Problem: Patient Education: Go to Patient Education  Activity  Goal: Patient/Family Education  Outcome: Progressing Towards Goal     Problem: Urinary Elimination - Impaired  Goal: *Absence/decrease in episodes of  urinary incontinence  Outcome: Progressing Towards Goal     Problem: Patient Education: Go to Patient Education Activity  Goal: Patient/Family Education  Outcome: Progressing Towards Goal     Problem: Pain  Goal: *Control of Pain  Outcome: Progressing Towards Goal     Problem: Patient Education: Go to Patient Education Activity  Goal: Patient/Family Education  Outcome: Progressing Towards Goal

## 2021-04-10 NOTE — Progress Notes (Signed)
Bedside shift change report given to Jasmine,RN (Cabin crew) by Evangeline Gula (offgoing nurse). Report included the following information SBAR, Kardex, Intake/Output, MAR, and Recent Results.   Wound Prevention Checklist    Patient: Thomas Jackson (50 y.o. male)  Date: 04/10/2021  Diagnosis: Obstructive uropathy [N13.9] <principal problem not specified>    Precautions: Fall       []   Heel prevention boots placed on patient    []   Patient turned q2h during shift    []   Lift team ordered    []   Patient on [facility] bed/Specialty bed    []   Each Wound is documented during shift (Stage, Color, drainage, odor, measurements, and dressings)    [x]   Dual skin checks done at bedside during shift report with Brandy Hale, RN

## 2021-04-10 NOTE — Progress Notes (Signed)
Patient foley cath removed @ 1005. Pt tolerated well. Pt to void within 6 hrs of foley being removed by 1605.

## 2021-04-10 NOTE — Progress Notes (Signed)
Bedside shift change report given to Margot Ables, Charity fundraiser (oncoming nurse) by Leavy Cella, RN (offgoing nurse). Report included the following information SBAR, Kardex, Intake/Output, MAR, and Quality Measures.      Wound Prevention Checklist    Patient: Thomas Jackson (50 y.o. male)  Date: 04/10/2021  Diagnosis: Obstructive uropathy [N13.9] <principal problem not specified>    Precautions: Fall       []   Heel prevention boots placed on patient    []   Patient turned q2h during shift    []   Lift team ordered    [x]   Patient on [facility] bed/Specialty bed    []   Each Wound is documented during shift (Stage, Color, drainage, odor, measurements, and dressings)    [x]   Dual skin checks done at bedside during shift report with , RN.    , RN

## 2021-04-10 NOTE — Progress Notes (Signed)
 Transition of Care Plan to SNF/Rehab    SNF/Rehab Transition:  Patient has been accepted to [facility] in [facility] and meets criteria for admission.   Patient will transported by significant other and expected to leave at 12noon.    Communication to Patient/Family:  Spoke with Michelle Portoukalan and they are agreeable to the transition plan.    Communication to SNF/Rehab:  Bedside RN, Jasmine/Phoebe, has been notified to update the transition plan to the facility and call report [phone].    Nursing Please include all hard scripts for controlled substances, med rec and dc summary, and AVS in packet.     Reviewed and confirmed with Arloa School, admissions director,can manage the patient care needs for the following:     Oneil with (X) only those applicable:    Medication:  [x]   Medications will be available at the facility     Documents:    [x] Discharge summary         Dietary:  [x] See d/c summary     Eligible for Medicaid Long Term    Financial Resources:  Medicaid      [x]  Full coverage     Advanced Care Plan:    [x] Communicated Code Status (Full)    Other    LOIS Sauers, BSN, RN  Pager # [phone]  Care Manager

## 2021-04-11 LAB — COMPREHENSIVE METABOLIC PANEL
ALT: 37 U/L (ref 16–61)
AST: 24 U/L (ref 10–38)
Albumin/Globulin Ratio: 0.6 — ABNORMAL LOW (ref 0.8–1.7)
Albumin: 2.5 g/dL — ABNORMAL LOW (ref 3.4–5.0)
Alkaline Phosphatase: 86 U/L (ref 45–117)
Anion Gap: 8 mmol/L (ref 3.0–18)
BUN: 50 MG/DL — ABNORMAL HIGH (ref 7.0–18)
Bun/Cre Ratio: 8 — ABNORMAL LOW (ref 12–20)
CO2: 23 mmol/L (ref 21–32)
Calcium: 8.9 MG/DL (ref 8.5–10.1)
Chloride: 109 mmol/L (ref 100–111)
Creatinine: 6.09 MG/DL — ABNORMAL HIGH (ref 0.6–1.3)
EGFR IF NonAfrican American: 10 mL/min/{1.73_m2} — ABNORMAL LOW (ref >60)
GFR African American: 12 mL/min/{1.73_m2} — ABNORMAL LOW (ref >60)
Globulin: 4.2 g/dL — ABNORMAL HIGH (ref 2.0–4.0)
Glucose: 79 mg/dL (ref 74–99)
Potassium: 4.8 mmol/L (ref 3.5–5.5)
Sodium: 140 mmol/L (ref 136–145)
Total Bilirubin: 0.3 MG/DL (ref 0.2–1.0)
Total Protein: 6.7 g/dL (ref 6.4–8.2)

## 2021-04-11 LAB — POCT GLUCOSE
POC Glucose: 104 mg/dL (ref 70–110)
POC Glucose: 78 mg/dL (ref 70–110)
POC Glucose: 95 mg/dL (ref 70–110)

## 2021-04-11 LAB — METABOLIC PANEL, COMPREHENSIVE
A-G Ratio: 0.6 — ABNORMAL LOW (ref 0.8–1.7)
ALT (SGPT): 37 U/L (ref 16–61)
AST (SGOT): 24 U/L (ref 10–38)
Albumin: 2.5 g/dL — ABNORMAL LOW (ref 3.4–5.0)
Alk. phosphatase: 86 U/L (ref 45–117)
Anion gap: 8 mmol/L (ref 3.0–18)
BUN/Creatinine ratio: 8 — ABNORMAL LOW (ref 12–20)
BUN: 50 MG/DL — ABNORMAL HIGH (ref 7.0–18)
Bilirubin, total: 0.3 MG/DL (ref 0.2–1.0)
CO2: 23 mmol/L (ref 21–32)
Calcium: 8.9 MG/DL (ref 8.5–10.1)
Chloride: 109 mmol/L (ref 100–111)
Creatinine: 6.09 MG/DL — ABNORMAL HIGH (ref 0.6–1.3)
GFR est AA: 12 mL/min/{1.73_m2} — ABNORMAL LOW (ref >60)
GFR est non-AA: 10 mL/min/{1.73_m2} — ABNORMAL LOW (ref >60)
Globulin: 4.2 g/dL — ABNORMAL HIGH (ref 2.0–4.0)
Glucose: 79 mg/dL (ref 74–99)
Potassium: 4.8 mmol/L (ref 3.5–5.5)
Protein, total: 6.7 g/dL (ref 6.4–8.2)
Sodium: 140 mmol/L (ref 136–145)

## 2021-04-11 LAB — GLUCOSE, POC
Glucose (POC): 104 mg/dL (ref 70–110)
Glucose (POC): 78 mg/dL (ref 70–110)
Glucose (POC): 95 mg/dL (ref 70–110)

## 2021-04-11 MED ORDER — LEVETIRACETAM 500 MG TAB
500 mg | Freq: Two times a day (BID) | ORAL | Status: DC
Start: 2021-04-11 — End: 2021-04-11

## 2021-04-11 MED FILL — CYANOCOBALAMIN (VITAMIN B-12) 2,500 MCG SUBLINGUAL TAB: 2500 mcg | SUBLINGUAL | Qty: 1

## 2021-04-11 MED FILL — LEVETIRACETAM 500 MG/5 ML IV SOLN: 500 mg/5 mL | INTRAVENOUS | Qty: 5

## 2021-04-11 MED FILL — THIAMINE HCL 100 MG TAB: 100 mg | ORAL | Qty: 1

## 2021-04-11 MED FILL — HYDRALAZINE 50 MG TAB: 50 mg | ORAL | Qty: 2

## 2021-04-11 MED FILL — AMLODIPINE 10 MG TAB: 10 mg | ORAL | Qty: 1

## 2021-04-11 MED FILL — PANTOPRAZOLE 40 MG TAB, DELAYED RELEASE: 40 mg | ORAL | Qty: 1

## 2021-04-11 MED FILL — METOPROLOL TARTRATE 50 MG TAB: 50 mg | ORAL | Qty: 1

## 2021-04-11 NOTE — Progress Notes (Signed)
Berniece Andreas the number for [facility] transportation for patient. The number to schedule transportation is [phone].

## 2021-04-11 NOTE — Progress Notes (Signed)
Discharge/Transition Planning   0930: [facility] and [facility] come anytime with updated dc summary       1130: CM notified by Dr Allena Katz medical transport would be better for patient girlfriend wants medical transport .    CM trying to locate Medicaid card with transport number for auth . Not in paper work from [facility] or uploaded in chart .     Have asked for assist from CM office     1200: CM called [facility] and they were not able to assist with transport auth number for [place] Medicaid and supervisor did not have number      CM googled for search and every number was not accurate     Called girlfriend and she had to look for card. After looking she read off numbers . There was no transport benefit listed on card.     Called and appears doesn't need prior auth for transport but cannot be sure       [facility] did not get auth for the transport when he discharged from there last month       1300: Went ahead and completed PCS and [facility]ed to [facility] with Face sheet with request of [facility]AP transport and if none available tomorrow morning     Calling [facility] and not able to reach anyone after multiple attempts.     1310: Able to reach rep at [facility]     They can come at 1430

## 2021-04-11 NOTE — Progress Notes (Signed)
[facility] Hospitalist Group  Progress Note    Patient: Thomas Jackson Age: 50 y.o. DOB: 10-Feb-1971 MR#: [id number] SSN: [id number]  Date/Time: 04/11/2021     Subjective:      Patient state he is sleepy and he is fine right now.  He does not want to get up at this point.  He denies having any pain or vomiting.    Objective:     VS: Visit Vitals  BP (!) 152/80   Pulse 61   Temp 98.5 ??F (36.9 ??C)   Resp 20   Ht 5' 3"  (1.6 m)   Wt 111.1 kg (245 lb)   SpO2 96%   BMI 43.40 kg/m??        Tmax/24hrs: Temp (24hrs), Avg:98.2 ??F (36.8 ??C), Min:97 ??F (36.1 ??C), Max:99 ??F (37.2 ??C)  IOBRIEF  Intake/Output Summary (Last 24 hours) at 04/11/2021 1117  Last data filed at 04/10/2021 1412  Gross per 24 hour   Intake 120 ml   Output --   Net 120 ml       General:  Alert, cooperative, no acute distress    Pulmonary:  CTA Bilaterally. No Wheezing/Rales.  Cardiovascular: Regular rate and Rhythm.  GI:  Soft, Non distended, Non tender. + Bowel sounds.  Extremities:  No edema. No calf tenderness.   Neurologic: Alert awake, receptive aphasia, follows commands inconsistently, moves left side better than right side.  Additional:    Assessment/Plan:     Hospital Problems  Date Reviewed: 2021/04/30            Codes Class Noted POA    Obstructive uropathy ICD-10-CM: N13.9  ICD-9-CM: 599.60  04/30/21 Unknown        CKD (chronic kidney disease) stage 4, GFR 15-29 ml/min (HCC) ICD-10-CM: N18.4  ICD-9-CM: 585.4  2021-04-30 Yes        Seizure disorder ([place]) ICD-10-CM: G40.909  ICD-9-CM: 345.90  04/30/2021 Yes        Type 2 diabetes mellitus ([place]) ICD-10-CM: E11.9  ICD-9-CM: 250.00  Apr 30, 2021 Yes        Metabolic encephalopathy LOV-56-EP: G93.41  ICD-9-CM: 348.31  2021/04/30 Yes        Depression ICD-10-CM: F32.A  ICD-9-CM: 329  30-Apr-2021 Yes        Essential hypertension ICD-10-CM: I10  ICD-9-CM: 401.9  2021/04/30 Yes        Mixed hyperlipidemia ICD-10-CM: E78.2  ICD-9-CM: 272.2  04-30-2021 Yes        GERD without esophagitis ICD-10-CM:  K21.9  ICD-9-CM: 530.81  04/30/2021 Yes        Vitamin D deficiency ICD-10-CM: E55.9  ICD-9-CM: 268.9  April 30, 2021 Yes        Obesity ICD-10-CM: E66.9  ICD-9-CM: 278.00  04/30/2021 Yes        Anemia ICD-10-CM: D64.9  ICD-9-CM: 285.9  Apr 30, 2021 Yes        UTI (urinary tract infection) ICD-10-CM: N39.0  ICD-9-CM: 599.0  Apr 30, 2021 Yes        AKI (acute kidney injury) ([place]) ICD-10-CM: N17.9  ICD-9-CM: 584.9  04-30-21 Yes        Sepsis ([place]) ICD-10-CM: A41.9  ICD-9-CM: 038.9, 995.91  2021-04-30 Yes        Intracerebral hemorrhage (HCC) ICD-10-CM: I61.9  ICD-9-CM: 518  02/24/2021 Yes       Sepsis POA (HR 841, WBC 66A, complicated UTI)  Complicated UTI - Ecoli, pan-sensitive   Obstructive uropathy, left renal stone with hydronephrosis.  Status post cystoscopy, stent placement by urology  AKI on CKD 4,   Metabolic acidosis, improved  Uremia  Hypernatremia, decreased free water intake  Hypertension  Leukocytosis, trending down  Thrombocytosis, reactive  Normocytic anemia  History of intracranial hemorrhage, stable  Acute metabolic encephalopathy due to multifactorial including sepsis, uremia, hypernatremia,  Seizure disorder, stable  Type 2 diabetes mellitus      Plan:    Continue IV ceftriaxone until patient is here then p.o. Levaquin upon discharge  We will change patient to IV to p.o. Keppra  Discontinue IV fluid and monitor renal function  Further plan per urology for definitive stone management as an outpatient  Resume hydralazine, metoprolol and amlodipine  Continue PPI  PT/OT eval and treatment, recommend SNF placement    Called and spoke to patient's fianc??: Updated her about medical condition.  She verbalized understanding about it.  She wants to talk to case manager for medical transportation arrangement.    Called case manager and notified about it    Patient is otherwise medically stable to be transferred to skilled nursing facility      Case discussed with:  [x] Patient  [x] Family  [x] Nursing  [] Case Management  DVT  Prophylaxis:  [] Lovenox  [] Hep SQ  [x] SCDs  [] Coumadin   [] Eliquis/Xarelto     Medications:   Current Facility-Administered Medications   Medication Dose Route Frequency    levETIRAcetam (KEPPRA) tablet 500 mg  500 mg Oral BID    cefTRIAXone (ROCEPHIN) 2 g in sterile water (preservative free) 20 mL IV syringe  2 g IntraVENous Q24H    hydrALAZINE (APRESOLINE) tablet 100 mg  100 mg Oral TID    amLODIPine (NORVASC) tablet 10 mg  10 mg Oral DAILY    insulin lispro (HUMALOG) injection   SubCUTAneous AC&HS    glucose chewable tablet 16 g  4 Tablet Oral PRN    glucagon (GLUCAGEN) injection 1 mg  1 mg IntraMUSCular PRN    dextrose 10% infusion 0-250 mL  0-250 mL IntraVENous PRN    pantoprazole (PROTONIX) tablet 40 mg  40 mg Oral ACB    rosuvastatin (CRESTOR) tablet 10 mg  10 mg Oral QHS    sodium chloride (NS) flush 5-40 mL  5-40 mL IntraVENous Q8H    sodium chloride (NS) flush 5-40 mL  5-40 mL IntraVENous PRN    acetaminophen (TYLENOL) tablet 650 mg  650 mg Oral Q6H PRN    Or    acetaminophen (TYLENOL) suppository 650 mg  650 mg Rectal Q6H PRN    polyethylene glycol (MIRALAX) packet 17 g  17 g Oral DAILY PRN    ondansetron (ZOFRAN ODT) tablet 4 mg  4 mg Oral Q8H PRN    Or    ondansetron (ZOFRAN) injection 4 mg  4 mg IntraVENous Q6H PRN    metoprolol tartrate (LOPRESSOR) tablet 50 mg  50 mg Oral BID    escitalopram oxalate (LEXAPRO) tablet 5 mg  5 mg Oral QPM    thiamine HCL (B-1) tablet 100 mg  100 mg Oral DAILY    cyanocobalamin (VITAMIN B12) sublingual tablet 2,500 mcg  2,500 mcg Oral DAILY       Labs:    Recent Results (from the past 24 hour(s))   GLUCOSE, POC    Collection Time: 04/10/21 11:40 AM   Result Value Ref Range    Glucose (POC) 149 (H) 70 - 110 mg/dL   GLUCOSE, POC    Collection Time: 04/10/21  3:57 PM   Result Value Ref Range    Glucose (POC)  116 (H) 70 - 110 mg/dL   GLUCOSE, POC    Collection Time: 04/10/21 10:33 PM   Result Value Ref Range    Glucose (POC) 104 70 - 981 mg/dL   METABOLIC PANEL,  COMPREHENSIVE    Collection Time: 04/11/21  2:35 AM   Result Value Ref Range    Sodium 140 136 - 145 mmol/L    Potassium 4.8 3.5 - 5.5 mmol/L    Chloride 109 100 - 111 mmol/L    CO2 23 21 - 32 mmol/L    Anion gap 8 3.0 - 18 mmol/L    Glucose 79 74 - 99 mg/dL    BUN 50 (H) 7.0 - 18 MG/DL    Creatinine 6.09 (H) 0.6 - 1.3 MG/DL    BUN/Creatinine ratio 8 (L) 12 - 20      GFR est AA 12 (L) >60 ml/min/1.66m    GFR est non-AA 10 (L) >60 ml/min/1.730m   Calcium 8.9 8.5 - 10.1 MG/DL    Bilirubin, total 0.3 0.2 - 1.0 MG/DL    ALT (SGPT) 37 16 - 61 U/L    AST (SGOT) 24 10 - 38 U/L    Alk. phosphatase 86 45 - 117 U/L    Protein, total 6.7 6.4 - 8.2 g/dL    Albumin 2.5 (L) 3.4 - 5.0 g/dL    Globulin 4.2 (H) 2.0 - 4.0 g/dL    A-G Ratio 0.6 (L) 0.8 - 1.7     GLUCOSE, POC    Collection Time: 04/11/21  7:48 AM   Result Value Ref Range    Glucose (POC) 78 70 - 110 mg/dL       Signed By: ViNatasha BenceMD     April 11, 2021      Disclaimer: Sections of this note are dictated using utilizing voice recognition software.  Minor typographical errors may be present. If questions arise, please do not hesitate to contact me or call our department.

## 2021-04-11 NOTE — Progress Notes (Signed)
Problem: Pressure Injury - Risk of  Goal: *Prevention of pressure injury  Description: Document Braden Scale and appropriate interventions in the flowsheet.  Outcome: Progressing Towards Goal  Note: Pressure Injury Interventions:  Sensory Interventions: Assess changes in LOC, Minimize linen layers    Moisture Interventions: Absorbent underpads    Activity Interventions: Pressure redistribution bed/mattress(bed type), Increase time out of bed    Mobility Interventions: Pressure redistribution bed/mattress (bed type)    Nutrition Interventions: Document food/fluid/supplement intake    Friction and Shear Interventions: HOB 30 degrees or less, Minimize layers                Problem: Patient Education: Go to Patient Education Activity  Goal: Patient/Family Education  Outcome: Progressing Towards Goal     Problem: Falls - Risk of  Goal: *Absence of Falls  Description: Document Schmid Fall Risk and appropriate interventions in the flowsheet.  Outcome: Progressing Towards Goal  Note: Fall Risk Interventions:  Mobility Interventions: Bed/chair exit alarm    Mentation Interventions: Bed/chair exit alarm    Medication Interventions: Bed/chair exit alarm    Elimination Interventions: Bed/chair exit alarm              Problem: Patient Education: Go to Patient Education Activity  Goal: Patient/Family Education  Outcome: Progressing Towards Goal     Problem: Patient Education: Go to Patient Education Activity  Goal: Patient/Family Education  Outcome: Progressing Towards Goal     Problem: Patient Education: Go to Patient Education Activity  Goal: Patient/Family Education  Outcome: Progressing Towards Goal     Problem: Urinary Elimination - Impaired  Goal: *Absence/decrease in episodes of  urinary incontinence  Outcome: Progressing Towards Goal     Problem: Patient Education: Go to Patient Education Activity  Goal: Patient/Family Education  Outcome: Progressing Towards Goal     Problem: Pain  Goal: *Control of Pain  Outcome:  Progressing Towards Goal     Problem: Patient Education: Go to Patient Education Activity  Goal: Patient/Family Education  Outcome: Progressing Towards Goal

## 2021-04-11 NOTE — Progress Notes (Signed)
Infectious Disease progress Note        Reason: sepsis, complicated UTI    Current abx Prior abx   Cefepime, vancomycin  8/26-8/29  Ceftriaxone since 8/29 Ceftriaxone 8/25     Lines:       Assessment :    50 y.o. male with a PMHx of Intracranial Hemorrhage, CKD stage 4, metabolic encephalopathy, depression, HTN, GERD, hyperlipidemia, seizure disorder, T2DM and Vit D deficiency who presented to [facility] from [facility] on 04/03/21 for evaluation of complicated urinary tract infection.    Clinical presentation consistent with sepsis-present on admission due to complicated UTI, left kidney pyelonephritis/hydronephrosis    Status post cystoscopy, left ureteral stent placement on 04/03/2021  Intra-Op cultures 8/25-E. Coli      Altered mentation-likely metabolic encephalopathy.    Acute on chronic kidney disease-likely due to obstructive uropathy, sepsis    Antibiotic management complicated due to documented history of penicillin allergy- tolerated cefepime, ceftriaxone without difficulties    Persistent leukocytosis despite clinical improvement, improvement in renal function  Improved mentation.  Improved left CVA tenderness   USG 04/10/21- mild left hydronephrosis    Recommendations:    D/c  ceftriaxone - start po levofloxacin  Follow-up nephrology recommendations  Obtain urology input regarding left hydronephrosis, ?need for intervention  Recommend to continue levofloxacin till 04/16/2021 if patient cleared for discharge per urology    Above plan was discussed in details with patient, dr. Aldean Baker Tilman Mcclaren  Please call me if any further questions or concerns. Will continue to participate in the care of this patient.    HPI:    Feels better.  Denies nausea, worsening left flank pain.  Does not answer all questions asked.        Past Medical History:   Diagnosis Date    CKD (chronic kidney disease) stage 4, GFR 15-29 ml/min (HCC) 04/03/2021    Depression 04/03/2021    Essential hypertension 04/03/2021    GERD without esophagitis  04/03/2021    Intracerebral hemorrhage (HCC) 02/24/2021    Intracerebral hemorrhage, nontraumatic (HCC)     Metabolic encephalopathy 04/03/2021    Mixed hyperlipidemia 04/03/2021    Obesity 04/03/2021    Seizure disorder (HCC) 04/03/2021    Type 2 diabetes mellitus (HCC) 04/03/2021    Vitamin D deficiency 04/03/2021       History reviewed. No pertinent surgical history.    home Medication List         Details   hydrALAZINE (APRESOLINE) 50 mg tablet Take 50 mg by mouth three (3) times daily.      amLODIPine (NORVASC) 10 mg tablet Take 1 Tablet by mouth in the morning.  Qty: 30 Tablet, Refills: 0      cyanocobalamin (VITAMIN B12) 2,500 mcg sublingual tablet Take 1 Tablet by mouth in the morning.  Qty: 30 Tablet, Refills: 0      ergocalciferol (ERGOCALCIFEROL) 1,250 mcg (50,000 unit) capsule Take 1 Capsule by mouth every seven (7) days.  Qty: 4 Capsule, Refills: 0      escitalopram oxalate (LEXAPRO) 5 mg tablet Take 1 Tablet by mouth every evening.  Qty: 30 Tablet, Refills: 0      levETIRAcetam (KEPPRA) 500 mg tablet Take 1 Tablet by mouth two (2) times a day.  Qty: 60 Tablet, Refills: 0      metoprolol tartrate (LOPRESSOR) 50 mg tablet Take 1 Tablet by mouth two (2) times a day.  Qty: 60 Tablet, Refills: 0      pantoprazole (PROTONIX) 40 mg tablet  Take 1 Tablet by mouth Daily (before breakfast).  Qty: 30 Tablet, Refills: 0      rosuvastatin (CRESTOR) 10 mg tablet Take 1 Tablet by mouth nightly.  Qty: 30 Tablet, Refills: 0      thiamine HCL (B-1) 100 mg tablet Take 1 Tablet by mouth in the morning.  Qty: 30 Tablet, Refills: 0             Current Facility-Administered Medications   Medication Dose Route Frequency    lactated Ringers infusion  50 mL/hr IntraVENous CONTINUOUS    cefTRIAXone (ROCEPHIN) 2 g in sterile water (preservative free) 20 mL IV syringe  2 g IntraVENous Q24H    hydrALAZINE (APRESOLINE) tablet 100 mg  100 mg Oral TID    amLODIPine (NORVASC) tablet 10 mg  10 mg Oral DAILY    insulin lispro (HUMALOG) injection    SubCUTAneous AC&HS    glucose chewable tablet 16 g  4 Tablet Oral PRN    glucagon (GLUCAGEN) injection 1 mg  1 mg IntraMUSCular PRN    dextrose 10% infusion 0-250 mL  0-250 mL IntraVENous PRN    pantoprazole (PROTONIX) tablet 40 mg  40 mg Oral ACB    rosuvastatin (CRESTOR) tablet 10 mg  10 mg Oral QHS    sodium chloride (NS) flush 5-40 mL  5-40 mL IntraVENous Q8H    sodium chloride (NS) flush 5-40 mL  5-40 mL IntraVENous PRN    acetaminophen (TYLENOL) tablet 650 mg  650 mg Oral Q6H PRN    Or    acetaminophen (TYLENOL) suppository 650 mg  650 mg Rectal Q6H PRN    polyethylene glycol (MIRALAX) packet 17 g  17 g Oral DAILY PRN    ondansetron (ZOFRAN ODT) tablet 4 mg  4 mg Oral Q8H PRN    Or    ondansetron (ZOFRAN) injection 4 mg  4 mg IntraVENous Q6H PRN    metoprolol tartrate (LOPRESSOR) tablet 50 mg  50 mg Oral BID    levETIRAcetam (KEPPRA) 500 mg in 0.9% sodium chloride (MBP/ADV) 100 mL MBP  500 mg IntraVENous Q12H    escitalopram oxalate (LEXAPRO) tablet 5 mg  5 mg Oral QPM    thiamine HCL (B-1) tablet 100 mg  100 mg Oral DAILY    cyanocobalamin (VITAMIN B12) sublingual tablet 2,500 mcg  2,500 mcg Oral DAILY       Allergies: Amoxicillin and Penicillin g    History reviewed. No pertinent family history.  Social History     Socioeconomic History    Marital status: SINGLE     Spouse name: Not on file    Number of children: Not on file    Years of education: Not on file    Highest education level: Not on file   Occupational History    Not on file   Tobacco Use    Smoking status: Not on file    Smokeless tobacco: Not on file   Substance and Sexual Activity    Alcohol use: Not on file    Drug use: Not on file    Sexual activity: Not on file   Other Topics Concern    Not on file   Social History Narrative    Not on file     Social Determinants of Health     Financial Resource Strain: Not on file   Food Insecurity: Not on file   Transportation Needs: Not on file   Physical Activity: Not on file   Stress: Not on file  Social Connections: Not on file   Intimate Partner Violence: Not on file   Housing Stability: Not on file     Social History     Tobacco Use   Smoking Status Not on file   Smokeless Tobacco Not on file        Temp (24hrs), Avg:98.2 ??F (36.8 ??C), Min:97 ??F (36.1 ??C), Max:99 ??F (37.2 ??C)    Visit Vitals  BP (!) 152/80   Pulse 61   Temp 98.5 ??F (36.9 ??C)   Resp 20   Ht 5\' 3"  (1.6 m)   Wt 111.1 kg (245 lb)   SpO2 96%   BMI 43.40 kg/m??       ROS: unable to obtain due to patient factors    Physical Exam:    General: Well developed, well nourished male sitting on the bed AAOx3 in no acute distress.    General:   awake alert and oriented   HEENT:  Normocephalic, atraumatic,EOMI, no scleral icterus or pallor; no conjunctival hemmohage;  nasal and oral mucous are moist and without evidence of lesions. No thrush. Dentition good. Neck supple, no bruits.   Lymph Nodes:   no cervical, axillary or inguinal adenopathy   Lungs:   non-labored, bilateral chest movements equal, no audible wheezing   Heart:  RRR, s1 and s2; no  rubs or gallops   Abdomen:  soft, non-distended, active bowel sounds, no hepatomegaly, no splenomegaly. Non-tender   Genitourinary: Foley in place   Extremities:   no clubbing, cyanosis; no joint effusions or swelling; Full ROM of all large joints to the upper and lower extremities; muscle mass appropriate for age   Neurologic:  Does not follow commands.  Moves all 4 extremities.  Detailed neurologic evaluation not feasible                        Skin:  No rash or ulcers noted   Back:  no spinal or paraspinal muscle tenderness or rigidity, resolved left CVA tenderness     Psychiatric:  confused         Labs: Results:   Chemistry Recent Labs     04/11/21  0235 04/10/21  0243 04/09/21  0237   GLU 79 89 84   NA 140 141 144   K 4.8 4.5 4.6   CL 109 110 112*   CO2 23 24 24    BUN 50* 54* 55*   CREA 6.09* 6.13* 6.69*   CA 8.9 8.7 8.6   AGAP 8 7 8    BUCR 8* 9* 8*   AP 86 86 80   TP 6.7 6.9 6.6   ALB 2.5* 2.6* 2.3*    GLOB 4.2* 4.3* 4.3*   AGRAT 0.6* 0.6* 0.5*        CBC w/Diff Recent Labs     04/10/21  0243 04/09/21  0237 04/08/21  0927   WBC 22.3* 21.1* 21.1*   RBC 3.35* 3.15* 3.30*   HGB 10.6* 9.8* 9.9*   HCT 32.2* 29.5* 30.6*   PLT 516* 533* 553*   GRANS 76* 86* 82*   LYMPH 11* 8* 11*   EOS 5 2 3         Microbiology No results for input(s): CULT in the last 72 hours.       RADIOLOGY:    All available imaging studies/reports in connect care for this admission were reviewed        Disclaimer: Sections of this note are dictated  utilizing voice recognition software, which may have resulted in some phonetic based errors in grammar and contents. Even though attempts were made to correct all the mistakes, some may have been missed, and remained in the body of the document. If questions arise, please contact our department.    Dr. Raiford SimmondsManali Hopelynn Gartland, Infectious Disease Specialist  [phone]  April 11, 2021  9:01 AM

## 2021-04-11 NOTE — Progress Notes (Signed)
Problem: Mobility Impaired (Adult and Pediatric)  Goal: *Acute Goals and Plan of Care (Insert Text)  Description: Physical Therapy Goals  Initiated 04/04/2021 and to be accomplished within 7 day(s), re-evaluated 04/11/21  1.  Patient will move from supine to sit and sit to supine  in bed with supervision/set-up.    2.  Patient will transfer from bed to chair and chair to bed with supervision/set-up using the least restrictive device.  3.  Patient will perform sit to stand with supervision/set-up.  4.  Patient will ambulate with supervision/set-up for 50 feet with the least restrictive device.     PLOF: Unable to provide information d/t aphasis. Per chart, admitted from facility; unclear if short stay rehab or long term care.      Outcome: Progressing Towards Goal     PHYSICAL THERAPY RE-EVALUATION    Patient: Thomas Jackson (50 y.o. male)  Date: 04/11/2021  Primary Diagnosis: Obstructive uropathy [N13.9]  Procedure(s) (LRB):  CYSTOSCOPY WITH LEFT RETROGRADES AND LEFT DOUBLE J STENT PLACEMENT/ C-ARM (Left) 8 Days Post-Op   Precautions:   Fall  PLOF: see above     ASSESSMENT :  Based on the objective data described below, the patient presents with generalized weakness, decreased functional mobility from baseline, impaired balance and gait, decreased activity tolerance. Pt seen with OT to maximize functional mobility and safety. Pt sleeping upon arrival and requires max encouragement and manual cues to wake up to sit EOB. Pt amb with slow shuffling gait with cues for eyes open and head up posture, min/CGA for safety and steadying this date. Pt defers sitting up and requests to return to sleep. Left in bed with all needs met and call bell within reach, bed alarm on. Will continue to follow.     Patient will benefit from skilled intervention to address the above impairments.  Patient's rehabilitation potential is considered to be Good  Factors which may influence rehabilitation potential include:   []          None  noted  [x]          Mental ability/status  [x]          Medical condition  []          Home/family situation and support systems  [x]          Safety awareness  []          Pain tolerance/management  []          Other:      PLAN :  Recommendations and Planned Interventions:   [x]            Bed Mobility Training             [x]     Neuromuscular Re-Education  [x]            Transfer Training                   []     Orthotic/Prosthetic Training  [x]            Gait Training                          []     Modalities  [x]            Therapeutic Exercises           []     Edema Management/Control  [x]            Therapeutic Activities            [  x]    Family Training/Education  [x]            Patient Education  []            Other (comment):    Frequency/Duration: Patient will be followed by physical therapy 1-2 times per day/3-5 days per week to address goals.    Further Equipment Recommendations for Discharge: rolling walker    AMPAC: 15/24    Based on an AM-PAC score of 15/24 (**/20 if omitting stairs) and their current functional mobility deficits, it is recommended that the patient have 3-5 sessions per week of Physical Therapy at d/c to increase the patient's independence.       This AMPAC score should be considered in conjunction with interdisciplinary team recommendations to determine the most appropriate discharge setting. Patient's social support, diagnosis, medical stability, and prior level of function should also be taken into consideration.     SUBJECTIVE:   Patient stated "Why?"    OBJECTIVE DATA SUMMARY:   Hospital course since last seen and reason for re-evaluation: slow but steady progress towards goals, will continue to follow per POC  Past Medical History:   Diagnosis Date    CKD (chronic kidney disease) stage 4, GFR 15-29 ml/min (HCC) 04/03/2021    Depression 04/03/2021    Essential hypertension 04/03/2021    GERD without esophagitis 04/03/2021    Intracerebral hemorrhage (HCC) 02/24/2021    Intracerebral hemorrhage,  nontraumatic (HCC)     Metabolic encephalopathy 04/03/2021    Mixed hyperlipidemia 04/03/2021    Obesity 04/03/2021    Seizure disorder (HCC) 04/03/2021    Type 2 diabetes mellitus (HCC) 04/03/2021    Vitamin D deficiency 04/03/2021   History reviewed. No pertinent surgical history.  Barriers to Learning/Limitations: yes;  physical  Compensate with: Visual Cues and Verbal Cues  Home Situation:   Home Situation  Home Environment: Rehabilitation facility  # Steps to Enter: 0  One/Two Story Residence: Two story, live on 1st floor  # of Interior Steps: 0  Height of Each Step (in): 0 inches  Interior Rails: None  Lift Chair Available: No  Living Alone: No  Support Systems: Spouse/Significant Other  Patient Expects to be Discharged to:: Other: ([facility]  [facility] re[facility] )  Current DME Used/Available at Home: None  Critical Behavior:  Neurologic State: Alert  Orientation Level: Oriented to person  Cognition: Follows commands  Safety/Judgement: Fall prevention                    Strength:    Strength: Generally decreased, functional                    Tone & Sensation:   Tone: Normal              Sensation: Intact               Range Of Motion:  AROM: Generally decreased, functional       Functional Mobility:  Bed Mobility:     Supine to Sit: Additional time;Moderate assistance  Sit to Supine: Stand-by assistance;Additional time  Scooting: Stand-by assistance  Transfers:  Sit to Stand: Contact guard assistance  Stand to Sit: Minimum assistance    Balance:   Sitting: Intact  Standing: Impaired;With support  Standing - Static: Fair (plus)  Standing - Dynamic : Fair       Ambulation/Gait Training:  Distance (ft): 25 Feet (ft)  Assistive Device: Walker, rolling  Ambulation - Level of Assistance: Minimal  assistance        Gait Abnormalities: Decreased step clearance        Base of Support: Narrowed;Center of gravity altered     Speed/Cadence: Slow;Shuffled  Step Length: Right shortened;Left shortened    Pain:  Pain level  pre-treatment: 0/10   Pain level post-treatment: 0/10   Pain Intervention(s) : Medication (see MAR); Rest, Ice, Repositioning   Response to intervention: Nurse notified    Activity Tolerance:   Fair    Please refer to the flowsheet for vital signs taken during this treatment.  After treatment:   []          Patient left in no apparent distress sitting up in chair  [x]          Patient left in no apparent distress in bed  [x]          Call bell left within reach  [x]          Nursing notified  []          Caregiver present  [x]          Bed alarm activated  []          SCDs applied    COMMUNICATION/EDUCATION:   [x]          Role of Physical Therapy in the acute care setting.  [x]          Fall prevention education was provided and the patient/caregiver indicated understanding.  [x]          Patient/family have participated as able in goal setting and plan of care.  [x]          Patient/family agree to work toward stated goals and plan of care.  []          Patient understands intent and goals of therapy, but is neutral about his/her participation.  []          Patient is unable to participate in goal setting/plan of care: ongoing with therapy staff.  []          Other:    Thank you for this referral.  Gaylyn Rong   Time Calculation: 20 mins    [employer] AM-PAC Basic Mobility Inpatient Short Form (6-Clicks) Version 2    How much HELP from another person does the patient currently need.    (If the patient hasn't done an activity recently, how much help from another person do you think he/she would need if he/she tried?)   Total (Total A or Dep)   A Lot  (Mod to Max A)   A Little (Sup or Min A)   None (Mod I to I)   Turning from your back to your side while in a flat bed without using bedrails?   []  1 [x]  2 []  3 []  4   2. Moving from lying on your back to sitting on the side of a flat bed without using bedrails?    []  1 [x]  2 []  3 []  4   3. Moving to and from a bed to a chair (including a wheelchair)?   []  1 []  2 [x]  3 []  4    4. Standing up from a chair using your arms (e.g., wheelchair, or bedside chair)?   []  1 []  2 [x]  3 []  4   5. Walking in hospital room?   []  1 []  2 [x]  3 []  4   6. Climbing 3-5 steps with a railing?+   []  1 [x]  2 []  3 []  4   +If stair  climbing cannot be assessed, skip item #6.  Sum responses from items 1-5.       Based on an AM-PAC score of 15/24 (**/20 if omitting stairs) and their current functional mobility deficits, it is recommended that the patient have 3-5 sessions per week of Physical Therapy at d/c to increase the patient's independence.

## 2021-04-11 NOTE — Progress Notes (Signed)
I have reviewed discharge instructions with the caregiver and guardian.  The caregiver and guardian verbalized understanding.  Patient being transferred to [facility], report given to [place] ,LPN.

## 2021-04-11 NOTE — Progress Notes (Signed)
Problem: Self Care Deficits Care Plan (Adult)  Goal: *Acute Goals and Plan of Care (Insert Text)  Description: Occupational Therapy Goals  Initiated 04/04/2021 within 7 day(s). Re-evaluated 04/11/2021, goal 1 met and DCd, continue with remaining goals.    1.  Patient will perform grooming with supervision/set-up sitting EOB with Good balance for >5 min.   2.  Patient will perform upper body dressing with supervision/set-up.  3.  Patient will perform functional activity in standing with supervision/set-up for >2 min.  4.  Patient will perform toilet transfers with supervision/set-up.  5.  Patient will perform all aspects of toileting with minimal assistance/contact guard assist.  6.  Patient will participate in upper extremity therapeutic exercise/activities with supervision/set-up for 8 minutes to improve endurance and UB strength needed for ADLs    7.  Patient will utilize energy conservation techniques during functional activities with verbal cues.    Prior Level of Function: Pt is poor historian due to aphasia. Pt indicates he receives assistance for toileting and LB ADLs, unsure of accuracy. Per chart review pt admitted from rehab.  Outcome: Progressing Towards Goal  OCCUPATIONAL THERAPY RE-EVALUATION    Patient: Thomas Jackson (50 y.o. male)  Date: 04/11/2021  Primary Diagnosis: Obstructive uropathy [N13.9]  Procedure(s) (LRB):  CYSTOSCOPY WITH LEFT RETROGRADES AND LEFT DOUBLE J STENT PLACEMENT/ C-ARM (Left) 8 Days Post-Op   Precautions:   Fall      ASSESSMENT :  Pt required Max encouragement to participate in OT. Pt is seen with PT to increase safety of the pt and staff during functional mobility and ADLs. Based on the objective data described below, the patient presents with gradual progress towards goals, meeting goal #1. Pt demos improvement with functional mobility and ADLs, with encouragement, benefiting from VCs to keep eyes open and for safe sequencing during all OOB activities.    Patient will benefit  from skilled intervention to address the above impairments.  Patient's rehabilitation potential is considered to be Fair  Factors which may influence rehabilitation potential include:   []              None noted  []              Mental ability/status  [x]              Medical condition  [x]              Home/family situation and support systems  []              Safety awareness  []              Pain tolerance/management  []              Other:      PLAN :  Recommendations and Planned Interventions:   [x]                Self Care Training                  [x]       Therapeutic Activities  [x]                Functional Mobility Training   []       Cognitive Retraining  [x]                Therapeutic Exercises           [x]       Endurance Activities  [x]   Balance Training                    [x]       Neuromuscular Re-Education  []                Visual/Perceptual Training     [x]       Home Safety Training  [x]                Patient Education                   [x]       Family Training/Education  []                Other (comment):    Frequency/Duration: Patient will be followed by occupational therapy 1-2 times per day/3-5days per week to address goals.    Further Equipment Recommendations for Discharge: bedside commode, rolling walker, and wheelchair     AMPAC: Based on an AM-PAC score of 16/24 and their current ADL deficits; it is recommended that the patient have 3-5 sessions per week of Occupational Therapy at d/c to increase the patient's independence.      This AMPAC score should be considered in conjunction with interdisciplinary team recommendations to determine the most appropriate discharge setting. Patient's social support, diagnosis, medical stability, and prior level of function should also be taken into consideration.     SUBJECTIVE:   Patient stated "What do you want?"    OBJECTIVE DATA SUMMARY:   Hospital course since last seen and reason for reevaluation: Pt is due for OT re-evaluation. Pt appears to be  benefiting from receiving OT services during this hospitalization stay. Pt is making steady progress toward goals but continues to be limited by poor motivation. OT will continue to follow the patient for further intervention during this hospitalization, in order to maximize ADL performance and overall functional independence.    Past Medical History:   Diagnosis Date    CKD (chronic kidney disease) stage 4, GFR 15-29 ml/min (HCC) 04/03/2021    Depression 04/03/2021    Essential hypertension 04/03/2021    GERD without esophagitis 04/03/2021    Intracerebral hemorrhage (HCC) 02/24/2021    Intracerebral hemorrhage, nontraumatic (HCC)     Metabolic encephalopathy 04/03/2021    Mixed hyperlipidemia 04/03/2021    Obesity 04/03/2021    Seizure disorder (HCC) 04/03/2021    Type 2 diabetes mellitus (HCC) 04/03/2021    Vitamin D deficiency 04/03/2021   History reviewed. No pertinent surgical history.  Barriers to Learning/Limitations: yes;  sensory deficits-vision/hearing/speech and altered mental status (i.e.Sedation, Confusion)  Compensate with: visual, verbal, tactile, kinesthetic cues/model    Home Situation:   Home Situation  Home Environment: Rehabilitation facility  # Steps to Enter: 0  One/Two Story Residence: Two story, live on 1st floor  # of Interior Steps: 0  Height of Each Step (in): 0 inches  Interior Rails: None  Retail buyer Available: No  Living Alone: No  Support Systems: Spouse/Significant Other  Patient Expects to be Discharged to:: Other: ([facility] rehab )  Current DME Used/Available at Home: None  [x]   Right hand dominant   []   Left hand dominant    Cognitive/Behavioral Status:  Neurologic State: Alert  Orientation Level: Oriented to person  Cognition: Follows commands  Safety/Judgement: Fall prevention    Skin: visible skin intact  Edema: none noted    Vision/Perceptual:       WFL  Coordination: BUE  Coordination: Generally decreased, functional  Fine Motor Skills-Upper: Left Impaired;Right Impaired     Gross Motor Skills-Upper: Left Intact;Right Intact    Balance:  Sitting: Intact  Standing: Impaired;With support  Standing - Static: Fair (plus)  Standing - Dynamic : Fair    Strength: BUE  Strength: Generally decreased, functional   Tone & Sensation: BUE  Tone: Normal  Sensation: Impaired   Range of Motion: BUE  AROM: Generally decreased, functional   Functional Mobility and Transfers for ADLs:  Bed Mobility:     Supine to Sit: Additional time;Moderate assistance  Sit to Supine: Stand-by assistance;Additional time  Scooting: Stand-by assistance  Transfers:  Sit to Stand: Contact guard assistance  Stand to Sit: Minimum assistance   Toilet Transfer : Minimum assistance (simulated with FWW)    Bathroom Mobility: Contact guard assistance (simulated with FWW)  ADL Assessment:   Feeding: Setup  Oral Facial Hygiene/Grooming: Supervision  Bathing: Moderate assistance  Upper Body Dressing: Minimum assistance  Lower Body Dressing: Moderate assistance  Toileting: Maximum assistance   ADL Intervention:     Cognitive Retraining  Safety/Judgement: Fall prevention    Pain:  Pain level pre-treatment: 0/10   Pain level post-treatment: 0/10    Activity Tolerance:   Fair  Please refer to the flowsheet for vital signs taken during this treatment.  After treatment:   []  Patient left in no apparent distress sitting up in chair  [x]  Patient left in no apparent distress in bed  [x]  Call bell left within reach  [x]  Nursing notified  []  Caregiver present  [x]  Bed alarm activated    COMMUNICATION/EDUCATION:   [x]  Role of Occupational Therapy in the acute care setting  [x]  Home safety education was provided and the patient/caregiver indicated understanding.  [x]  Patient/family have participated as able in goal setting and plan of care.  []  Patient/family agree to work toward stated goals and plan of care.  []  Patient understands intent and goals of therapy, but is neutral about his/her participation.  []  Patient is unable to participate in  goal setting and plan of care.    Thank you for this referral.  Kemper Durie, OTR/L  Time Calculation: 21 mins    [facility] AM-PAC Daily Activity Inpatient Short Form (6-Clicks)*    How much HELP from another person does the patient currently need.    (If the patient hasn't done an activity recently, how much help from another person do you think he/she would need if he/she tried?)   Total (Total A or Dep)   A Lot  (Mod to Max A)   A Little (Sup or Min A)   None (Mod I to I)   Putting on and taking off regular lower body clothing?   []  1 [x]  2 []  3 []  4   2. Bathing (including washing, rinsing,      drying)?    []  1 [x]  2 []  3 []  4   3. Toileting, which includes using toilet, bedpan or urinal?   []  1 [x]  2 []  3 []  4   4. Putting on and taking off regular upper body clothing?   []  1 []  2 [x]  3 []  4   5. Taking care of personal grooming such as brushing teeth?   []  1 []  2 [x]  3 []  4   6. Eating meals?   []  1 []  2 []  3 [x]  4   16/24     Based on an AM-PAC score of 16/24 and their current ADL deficits; it is recommended  that the patient have 3-5 sessions per week of Occupational Therapy at d/c to increase the patient's independence.

## 2021-04-11 NOTE — Progress Notes (Signed)
Problem: Pressure Injury - Risk of  Goal: *Prevention of pressure injury  Description: Document Braden Scale and appropriate interventions in the flowsheet.  Outcome: Progressing Towards Goal  Note: Pressure Injury Interventions:  Sensory Interventions: Assess changes in LOC, Check visual cues for pain, Keep linens dry and wrinkle-free, Minimize linen layers    Moisture Interventions: Absorbent underpads, Apply protective barrier, creams and emollients, Check for incontinence Q2 hours and as needed, Maintain skin hydration (lotion/cream), Minimize layers, Moisture barrier    Activity Interventions: Assess need for specialty bed    Mobility Interventions: Assess need for specialty bed    Nutrition Interventions: Document food/fluid/supplement intake    Friction and Shear Interventions: Apply protective barrier, creams and emollients, Lift sheet, Minimize layers                Problem: Patient Education: Go to Patient Education Activity  Goal: Patient/Family Education  Outcome: Progressing Towards Goal     Problem: Falls - Risk of  Goal: *Absence of Falls  Description: Document Bridgette Habermann Fall Risk and appropriate interventions in the flowsheet.  Outcome: Progressing Towards Goal  Note: Fall Risk Interventions:  Mobility Interventions: Bed/chair exit alarm    Mentation Interventions: Bed/chair exit alarm    Medication Interventions: Bed/chair exit alarm    Elimination Interventions: Bed/chair exit alarm              Problem: Patient Education: Go to Patient Education Activity  Goal: Patient/Family Education  Outcome: Progressing Towards Goal     Problem: Patient Education: Go to Patient Education Activity  Goal: Patient/Family Education  Outcome: Progressing Towards Goal     Problem: Patient Education: Go to Patient Education Activity  Goal: Patient/Family Education  Outcome: Progressing Towards Goal     Problem: Urinary Elimination - Impaired  Goal: *Absence/decrease in episodes of  urinary incontinence  Outcome:  Progressing Towards Goal     Problem: Patient Education: Go to Patient Education Activity  Goal: Patient/Family Education  Outcome: Progressing Towards Goal     Problem: Pain  Goal: *Control of Pain  Outcome: Progressing Towards Goal     Problem: Patient Education: Go to Patient Education Activity  Goal: Patient/Family Education  Outcome: Progressing Towards Goal

## 2021-04-11 NOTE — Progress Notes (Signed)
Patient fiance spoke with physician to see about transportation. Wants a call from case management to verify plan for discharge transportation.

## 2021-04-11 NOTE — Progress Notes (Signed)
Progress Notes by Georgiann Cocker, MD at 04/11/21 1253                Author: Georgiann Cocker, MD  Service: Nephrology  Author Type: Physician       Filed: 04/11/21 1257  Date of Service: 04/11/21 1253  Status: Signed          Editor: Georgiann Cocker, MD (Physician)                       In Patient Progress note         Admit Date: 04/03/2021        Impression:        1) AKI on CKD 4 ( baseline 3-4 creatinine ) d/t ATN in setting of sepsis    , some obst component causing AKI as well, renal functions improving    2) Concern for severe Septis ,Urosepsis     3) S/p cysto with stent placed   4) Left mild hydro with UPJ stone ,Urology following    5) Metabolic acidoses improving    6) Hypernatremia better   7) Altered mental status    8) h/o intracranial hemorrhage        Plan:   1) strict intake/output    2) d/c IVF and encourage po intake    3) avoid IV contrast, nephrotoxins   4) AB per ID       Needs Nephro follow up in 2-3 weeks ,    Ok to [place] or [place] ( Dr Broadus John).   If they prefer he can follow up with me at [facility] office too , totally upto the patient/family    Needs urology follow up as well .       Please call with questions,       Robyne Askew, MD FASN   Cell [phone]   Pager: [phone]          Subjective:        - awake and alert     - No acute over night events.   - respiratory - stable   - hemodynamics - stable, no pressrs   - UOP-ok   - Nutrition -ok        Objective:        Visit Vitals      BP  (!) 180/80     Pulse  65     Temp  98 ??F (36.7 ??C)     Resp  20     Ht  5\' 3"  (1.6 m)     Wt  111.1 kg (245 lb)     SpO2  96%        BMI  43.40 kg/m??              Intake/Output Summary (Last 24 hours) at 04/11/2021 1253   Last data filed at 04/10/2021 1412     Gross per 24 hour        Intake  120 ml        Output  --        Net  120 ml                Physical Exam:        Patient is in no apparent distress.    HEENT: mmm   Neck: no cervical lymphadenopathy or  thyromegaly.    Lungs: good air entry, clear to auscultation  bilaterally.    Cardiovascular system: S1, S2, regular rate and rhythm.    Abdomen: soft, non tender, non distended.     Extremities: no clubbing, cyanosis or edema.    Integumentary: skin is grossly intact.       Data Review:        Recent Labs           04/10/21   0243     WBC  22.3*     RBC  3.35*     HCT  32.2*     MCV  96.1     MCH  31.6     MCHC  32.9        RDW  12.1             Recent Labs             04/11/21   0235  04/10/21   0243  04/09/21   0237     BUN  50*  54*  55*     CREA  6.09*  6.13*  6.69*     CA  8.9  8.7  8.6     ALB  2.5*  2.6*  2.3*     K  4.8  4.5  4.6     NA  140  141  144     CL  109  110  112*     CO2  23  24  24           GLU  79  89  84              , MD

## 2021-04-11 NOTE — Progress Notes (Signed)
Transition of Care Plan to SNF/Rehab     SNF/Rehab Transition:  Patient has been accepted to [facility] in [facility] and meets criteria for admission.   Patient will transported by significant other and expected to leave at 12noon.     Communication to Patient/Family:  Spoke with Wynonia Lawman and they are agreeable to the transition plan.     Communication to SNF/Rehab:  Bedside RN, Jasmine/Phoebe, has been notified to update the transition plan to the facility and call report [phone].     Nursing Please include all hard scripts for controlled substances, med rec and dc summary, and AVS in packet.      Reviewed and confirmed with Dulcy Fanny, admissions director,can manage the patient care needs for the following:      Loraine Leriche with (X) only those applicable:     Medication:  [x]   Medications will be available at the facility      Documents:     [x] Discharge summary           Dietary:  [x] See d/c summary      Eligible for Medicaid Long Term    Financial Resources:  Medicaid       [x]  Full coverage      Advanced Care Plan:     [x] Communicated Code Status (Full")    Other

## 2021-04-11 NOTE — Discharge Summary (Signed)
Discharge Summary by Baltazar Najjar, MD at 04/11/21 1120                Author: Baltazar Najjar, MD  Service: Hospitalist  Author Type: Physician       Filed: 04/11/21 1121  Date of Service: 04/11/21 1120  Status: Addendum          Editor: Baltazar Najjar, MD (Physician)          Related Notes: Original Note by Henrietta Hoover, DO (Physician) filed at 04/10/21 [phone]                    Discharge Summary          Patient: Thomas Jackson  MRN: [id number]   CSN: [id number]          Date of Birth: 12-01-1970   Age: 50 y.o.   Sex: male          DOA: 04/03/2021  LOS:  LOS: 7 days    Discharge Date: 04/11/2021        Admission Diagnosis: Obstructive uropathy [N13.9]      Discharge Diagnosis:        Hospital Problems   Date Reviewed: 04/03/2021                            Codes  Class  Noted  POA              Obstructive uropathy  ICD-10-CM: N13.9   ICD-9-CM: 599.60    04/03/2021  Unknown                        CKD (chronic kidney disease) stage 4, GFR 15-29 ml/min (HCC)  ICD-10-CM: N18.4   ICD-9-CM: 585.4    04/03/2021  Yes                        Seizure disorder (HCC)  ICD-10-CM: G40.909   ICD-9-CM: 345.90    04/03/2021  Yes                        Type 2 diabetes mellitus (HCC)  ICD-10-CM: E11.9   ICD-9-CM: 250.00    04/03/2021  Yes                        Metabolic encephalopathy  ICD-10-CM: G93.41   ICD-9-CM: 348.31    04/03/2021  Yes                        Depression  ICD-10-CM: F32.A   ICD-9-CM: 311    04/03/2021  Yes                        Essential hypertension  ICD-10-CM: I10   ICD-9-CM: 401.9    04/03/2021  Yes                        Mixed hyperlipidemia  ICD-10-CM: E78.2   ICD-9-CM: 272.2    04/03/2021  Yes                        GERD without esophagitis  ICD-10-CM: K21.9   ICD-9-CM: 530.81    04/03/2021  Yes  Vitamin D deficiency  ICD-10-CM: E55.9   ICD-9-CM: 268.9    04/03/2021  Yes                        Obesity  ICD-10-CM: E66.9   ICD-9-CM: 278.00    04/03/2021  Yes                         Anemia  ICD-10-CM: D64.9   ICD-9-CM: 285.9    04/03/2021  Yes                        UTI (urinary tract infection)  ICD-10-CM: N39.0   ICD-9-CM: 599.0    04/03/2021  Yes                        AKI (acute kidney injury) (HCC)  ICD-10-CM: N17.9   ICD-9-CM: 584.9    04/03/2021  Yes                        Sepsis (HCC)  ICD-10-CM: A41.9   ICD-9-CM: 038.9, 995.91    04/03/2021  Yes                        Intracerebral hemorrhage (HCC)  ICD-10-CM: I61.9   ICD-9-CM: 431    02/24/2021  Yes                     Discharge Condition: Stable   Discharge Disposition: Rehab [facility]      PHYSICAL EXAM   Visit Vitals      BP  (!) 180/84     Pulse  79     Temp  97.9 ??F (36.6 ??C)     Resp  20     Ht  5\' 3"  (1.6 m)     Wt  111.1 kg (245 lb)     SpO2  94%        BMI  43.40 kg/m??           General:  Alert, no acute distress     Pulmonary:  CTA Bilaterally. No Wheezing/Rales.   Cardiovascular: Regular rate and Rhythm.   GI:  Soft, Non distended, Non tender. + Bowel sounds.   Extremities:  No edema. No calf tenderness.    Neurologic: Alert awake, receptive aphasia, follows commands inconsistently, moves left side better than right side.      Hospital Course By Problem:       1.  Sepsis  (POA) secondary to complicated UTI - Ecoli, pan-sensitive    2.  Obstructive uropathy, left renal stone with hydronephrosis.  Status post cysto, L RGP, L JJ stent placement by 04/03/21 by Dr. Sinclair GroomsGiven. Foley catheter was discontinued on 04/10/21. Patient has follow  up scheduled by urology.    3.  AKI on CKD 4 secondary to ATN- Cr 6.13. upon discharge. Cr 9.73 upon admission. Will likely take several weeks for renal function to return to baseline. Nephrology (Dr. Mercer PodBichu) followed. Patient will need to establish with urologist as an outpatient.    4.  Metabolic acidosis, resolved.    5.  Uremia   6.  Hypernatremia, decreased free water intake   #4-6. All resolved with IVF and improvements in renal function.    7.  Hypertension - continued on home medications.    8.   Normocytic  anemia - no transfusions required. Hgb 9.8    9.  History of intracranial hemorrhage, stable - CT head this admission with no change.    10.  Acute metabolic encephalopathy due to multifactorial including sepsis, uremia, hypernatremia. Now resolved.    11.  Seizure disorder, stable. Home medications were continued    12.  Documented h/o Type 2 diabetes mellitus, Hgb A1c 6.0 upon admission. Patient stated he is no home medications for diabetes.       Consults:    Nephrology - Dr. Mercer Pod    ID - Dr. Allena Katz    Urology - Dr. Sinclair Grooms       Significant Diagnostic Studies:    Component  Ref Range & Units  04/03/21 1958   Resulting Agency      Special Requests:    LEFT KIDNEY URINE   [facility]      Colony Count     >100,000   COLONIES/mL   [facility]      Culture result:     ESCHERICHIA COLI Abnormal    [facility]                Susceptibility        Escherichia coli          MIC          Amikacin ($)  Susceptible          Ampicillin ($)  Susceptible          Ampicillin/sulbactam ($)  Susceptible          Cefazolin ($)  Susceptible          Cefepime ($$)  Susceptible          Cefoxitin  Susceptible          Ceftazidime ($)  Susceptible          Ceftriaxone ($)  Susceptible          Ciprofloxacin ($)  Susceptible          Gentamicin ($)  Susceptible          Levofloxacin ($)  Susceptible          Meropenem ($$)  Susceptible          Nitrofurantoin  Susceptible          Piperacillin/Tazobac ($)  Susceptible          Tobramycin ($)  Susceptible          Trimeth/Sulfa  Susceptible                                 Discharge Medications:        Current Discharge Medication List                 START taking these medications          Details        levoFLOXacin (LEVAQUIN) 250 mg tablet  Take 1 Tablet by mouth every fourty-eight (48) hours for 6 days. Take 1 tablet by mouth on Sept 1, Sept 3, Sept 5.  Indications: complicated UTI   Qty: 3 Tablet, Refills: 0                        CONTINUE these  medications which have CHANGED          Details  hydrALAZINE (APRESOLINE) 100 mg tablet  Take 1 Tablet by mouth three (3) times daily.   Qty: 90 Tablet, Refills: 0                        CONTINUE these medications which have NOT CHANGED          Details        amLODIPine (NORVASC) 10 mg tablet  Take 1 Tablet by mouth in the morning.   Qty: 30 Tablet, Refills: 0               cyanocobalamin (VITAMIN B12) 2,500 mcg sublingual tablet  Take 1 Tablet by mouth in the morning.   Qty: 30 Tablet, Refills: 0               ergocalciferol (ERGOCALCIFEROL) 1,250 mcg (50,000 unit) capsule  Take 1 Capsule by mouth every seven (7) days.   Qty: 4 Capsule, Refills: 0               escitalopram oxalate (LEXAPRO) 5 mg tablet  Take 1 Tablet by mouth every evening.   Qty: 30 Tablet, Refills: 0               levETIRAcetam (KEPPRA) 500 mg tablet  Take 1 Tablet by mouth two (2) times a day.   Qty: 60 Tablet, Refills: 0               metoprolol tartrate (LOPRESSOR) 50 mg tablet  Take 1 Tablet by mouth two (2) times a day.   Qty: 60 Tablet, Refills: 0               pantoprazole (PROTONIX) 40 mg tablet  Take 1 Tablet by mouth Daily (before breakfast).   Qty: 30 Tablet, Refills: 0               rosuvastatin (CRESTOR) 10 mg tablet  Take 1 Tablet by mouth nightly.   Qty: 30 Tablet, Refills: 0               thiamine HCL (B-1) 100 mg tablet  Take 1 Tablet by mouth in the morning.   Qty: 30 Tablet, Refills: 0                         Activity: activity as tolerated      Diet: Regular Diet      Wound Care: None needed      Follow-up: with PCP, Lisbeth Ply, NP in 7-10days      Total discharge time greater than 35 minutes      Makya Yurko Kathe Mariner, MD      Disclaimer: Sections of this note are dictated using utilizing voice recognition software, which may have resulted in some phonetic based errors in grammar and contents. Even though attempts were made to correct all the mistakes, some may have been missed,  and remained in the body of the  document. If questions arise, please contact our department.

## 2021-04-11 NOTE — Progress Notes (Signed)
Problem: Pressure Injury - Risk of  Goal: *Prevention of pressure injury  Description: Document Braden Scale and appropriate interventions in the flowsheet.  04/11/2021 1519 by Delma Officer, RN  Outcome: Resolved/Met  04/11/2021 1510 by Delma Officer, RN  Outcome: Progressing Towards Goal  Note: Pressure Injury Interventions:  Sensory Interventions: Assess changes in LOC, Minimize linen layers    Moisture Interventions: Absorbent underpads    Activity Interventions: Pressure redistribution bed/mattress(bed type), Increase time out of bed    Mobility Interventions: Pressure redistribution bed/mattress (bed type)    Nutrition Interventions: Document food/fluid/supplement intake    Friction and Shear Interventions: HOB 30 degrees or less, Minimize layers                Problem: Patient Education: Go to Patient Education Activity  Goal: Patient/Family Education  04/11/2021 1519 by Delma Officer, RN  Outcome: Resolved/Met  04/11/2021 1510 by Delma Officer, RN  Outcome: Progressing Towards Goal     Problem: Falls - Risk of  Goal: *Absence of Falls  Description: Document Schmid Fall Risk and appropriate interventions in the flowsheet.  04/11/2021 1519 by Delma Officer, RN  Outcome: Resolved/Met  04/11/2021 1510 by Delma Officer, RN  Outcome: Progressing Towards Goal  Note: Fall Risk Interventions:  Mobility Interventions: Bed/chair exit alarm    Mentation Interventions: Bed/chair exit alarm    Medication Interventions: Bed/chair exit alarm    Elimination Interventions: Bed/chair exit alarm              Problem: Patient Education: Go to Patient Education Activity  Goal: Patient/Family Education  04/11/2021 1519 by Delma Officer, RN  Outcome: Resolved/Met  04/11/2021 1510 by Delma Officer, RN  Outcome: Progressing Towards Goal     Problem: Patient Education: Go to Patient Education Activity  Goal: Patient/Family Education  04/11/2021 1519 by Delma Officer, RN  Outcome: Resolved/Met  04/11/2021 1510 by  Delma Officer, RN  Outcome: Progressing Towards Goal     Problem: Patient Education: Go to Patient Education Activity  Goal: Patient/Family Education  04/11/2021 1519 by Delma Officer, RN  Outcome: Resolved/Met  04/11/2021 1510 by Delma Officer, RN  Outcome: Progressing Towards Goal     Problem: Urinary Elimination - Impaired  Goal: *Absence/decrease in episodes of  urinary incontinence  04/11/2021 1519 by Delma Officer, RN  Outcome: Resolved/Met  04/11/2021 1510 by Delma Officer, RN  Outcome: Progressing Towards Goal     Problem: Patient Education: Go to Patient Education Activity  Goal: Patient/Family Education  04/11/2021 1519 by Delma Officer, RN  Outcome: Resolved/Met  04/11/2021 1510 by Delma Officer, RN  Outcome: Progressing Towards Goal     Problem: Pain  Goal: *Control of Pain  04/11/2021 1519 by Delma Officer, RN  Outcome: Resolved/Met  04/11/2021 1510 by Delma Officer, RN  Outcome: Progressing Towards Goal     Problem: Patient Education: Go to Patient Education Activity  Goal: Patient/Family Education  04/11/2021 1519 by Delma Officer, RN  Outcome: Resolved/Met  04/11/2021 1510 by Delma Officer, RN  Outcome: Progressing Towards Goal     Problem: Pressure Injury - Risk of  Goal: *Prevention of pressure injury  Description: Document Braden Scale and appropriate interventions in the flowsheet.  04/11/2021 1519 by Delma Officer, RN  Outcome: Resolved/Met  04/11/2021 1510 by Delma Officer, RN  Outcome: Progressing Towards Goal  Note: Pressure Injury Interventions:  Sensory Interventions: Assess changes in  LOC, Minimize linen layers    Moisture Interventions: Absorbent underpads    Activity Interventions: Pressure redistribution bed/mattress(bed type), Increase time out of bed    Mobility Interventions: Pressure redistribution bed/mattress (bed type)    Nutrition Interventions: Document food/fluid/supplement intake    Friction and Shear Interventions: HOB 30 degrees or less, Minimize  layers                Problem: Patient Education: Go to Patient Education Activity  Goal: Patient/Family Education  04/11/2021 1519 by Delma Officer, RN  Outcome: Resolved/Met  04/11/2021 1510 by Delma Officer, RN  Outcome: Progressing Towards Goal     Problem: Falls - Risk of  Goal: *Absence of Falls  Description: Document Bridgette Habermann Fall Risk and appropriate interventions in the flowsheet.  04/11/2021 1519 by Delma Officer, RN  Outcome: Resolved/Met  04/11/2021 1510 by Delma Officer, RN  Outcome: Progressing Towards Goal  Note: Fall Risk Interventions:  Mobility Interventions: Bed/chair exit alarm    Mentation Interventions: Bed/chair exit alarm    Medication Interventions: Bed/chair exit alarm    Elimination Interventions: Bed/chair exit alarm              Problem: Patient Education: Go to Patient Education Activity  Goal: Patient/Family Education  04/11/2021 1519 by Delma Officer, RN  Outcome: Resolved/Met  04/11/2021 1510 by Delma Officer, RN  Outcome: Progressing Towards Goal     Problem: Patient Education: Go to Patient Education Activity  Goal: Patient/Family Education  04/11/2021 1519 by Delma Officer, RN  Outcome: Resolved/Met  04/11/2021 1510 by Delma Officer, RN  Outcome: Progressing Towards Goal     Problem: Patient Education: Go to Patient Education Activity  Goal: Patient/Family Education  04/11/2021 1519 by Delma Officer, RN  Outcome: Resolved/Met  04/11/2021 1510 by Delma Officer, RN  Outcome: Progressing Towards Goal     Problem: Urinary Elimination - Impaired  Goal: *Absence/decrease in episodes of  urinary incontinence  04/11/2021 1519 by Delma Officer, RN  Outcome: Resolved/Met  04/11/2021 1510 by Delma Officer, RN  Outcome: Progressing Towards Goal     Problem: Patient Education: Go to Patient Education Activity  Goal: Patient/Family Education  04/11/2021 1519 by Delma Officer, RN  Outcome: Resolved/Met  04/11/2021 1510 by Delma Officer, RN  Outcome: Progressing Towards  Goal     Problem: Pain  Goal: *Control of Pain  04/11/2021 1519 by Delma Officer, RN  Outcome: Resolved/Met  04/11/2021 1510 by Delma Officer, RN  Outcome: Progressing Towards Goal     Problem: Patient Education: Go to Patient Education Activity  Goal: Patient/Family Education  04/11/2021 1519 by Delma Officer, RN  Outcome: Resolved/Met  04/11/2021 1510 by Delma Officer, RN  Outcome: Progressing Towards Goal     Problem: Pressure Injury - Risk of  Goal: *Prevention of pressure injury  Description: Document Braden Scale and appropriate interventions in the flowsheet.  04/11/2021 1519 by Delma Officer, RN  Outcome: Resolved/Met  04/11/2021 1510 by Delma Officer, RN  Outcome: Progressing Towards Goal  Note: Pressure Injury Interventions:  Sensory Interventions: Assess changes in LOC, Minimize linen layers    Moisture Interventions: Absorbent underpads    Activity Interventions: Pressure redistribution bed/mattress(bed type), Increase time out of bed    Mobility Interventions: Pressure redistribution bed/mattress (bed type)    Nutrition Interventions: Document food/fluid/supplement intake    Friction and Shear Interventions: HOB 30 degrees or less, Minimize layers  Problem: Patient Education: Go to Patient Education Activity  Goal: Patient/Family Education  04/11/2021 1519 by Delma Officer, RN  Outcome: Resolved/Met  04/11/2021 1510 by Delma Officer, RN  Outcome: Progressing Towards Goal     Problem: Falls - Risk of  Goal: *Absence of Falls  Description: Document Bridgette Habermann Fall Risk and appropriate interventions in the flowsheet.  04/11/2021 1519 by Delma Officer, RN  Outcome: Resolved/Met  04/11/2021 1510 by Delma Officer, RN  Outcome: Progressing Towards Goal  Note: Fall Risk Interventions:  Mobility Interventions: Bed/chair exit alarm    Mentation Interventions: Bed/chair exit alarm    Medication Interventions: Bed/chair exit alarm    Elimination Interventions: Bed/chair exit alarm               Problem: Patient Education: Go to Patient Education Activity  Goal: Patient/Family Education  04/11/2021 1519 by Delma Officer, RN  Outcome: Resolved/Met  04/11/2021 1510 by Delma Officer, RN  Outcome: Progressing Towards Goal     Problem: Patient Education: Go to Patient Education Activity  Goal: Patient/Family Education  04/11/2021 1519 by Delma Officer, RN  Outcome: Resolved/Met  04/11/2021 1510 by Delma Officer, RN  Outcome: Progressing Towards Goal     Problem: Patient Education: Go to Patient Education Activity  Goal: Patient/Family Education  04/11/2021 1519 by Delma Officer, RN  Outcome: Resolved/Met  04/11/2021 1510 by Delma Officer, RN  Outcome: Progressing Towards Goal     Problem: Urinary Elimination - Impaired  Goal: *Absence/decrease in episodes of  urinary incontinence  04/11/2021 1519 by Delma Officer, RN  Outcome: Resolved/Met  04/11/2021 1510 by Delma Officer, RN  Outcome: Progressing Towards Goal     Problem: Patient Education: Go to Patient Education Activity  Goal: Patient/Family Education  04/11/2021 1519 by Delma Officer, RN  Outcome: Resolved/Met  04/11/2021 1510 by Delma Officer, RN  Outcome: Progressing Towards Goal     Problem: Pain  Goal: *Control of Pain  04/11/2021 1519 by Delma Officer, RN  Outcome: Resolved/Met  04/11/2021 1510 by Delma Officer, RN  Outcome: Progressing Towards Goal     Problem: Patient Education: Go to Patient Education Activity  Goal: Patient/Family Education  04/11/2021 1519 by Delma Officer, RN  Outcome: Resolved/Met  04/11/2021 1510 by Delma Officer, RN  Outcome: Progressing Towards Goal

## 2021-04-15 NOTE — Telephone Encounter (Signed)
Call from [facility] with [employer] [facility]  To schedule pt for a new appt for a stent removal with Dr Given. He put the stent in at [facility] center on 04/03/21/.  Full dx-Obstructive uropathy, left renal stone with hydronephrosis.  Status post cystoscopy, stent placement by urology.  [phone]  [phone]

## 2021-04-15 NOTE — Telephone Encounter (Signed)
Called to schedule ER follow up apt. Thomas Jackson [facility] states he went back to ER. Informed I will call back to schedule once he is no longer in the hospital and call was ended.    STONE PATHWAY EXT. 3644    STONE PATHWAY SURGERY SCHEDULING NOTE     - Stone pathway: Intermediate   - Surgery recommended: Left Ureteroscopic Stone Extraction with Laser   - Stented: YES   - Reason for stent: Concern for Infection   - Location of CT: [place]   - Okay for ASC: NO   - COVID Vaccinated: NO   - Anticoagulant/antiplatelet to stop: NO       Psychologist, counselling, PA      ASSESSMENT:    Mild Left Hydronephrosis 2/2 11mm Left UPJ Stone on CT 04/02/21 from [facility]   UTI   AKI   S/p Cysto, L RGP, L JJ stent placement on 04/03/21 by Dr. Sinclair Grooms   Findings: Pus in Kidney               WBC 25.7>24.5   Creat: 9.42<9.73               Baseline 3.6-4.6             UA 04/02/21: Positive Nitrite, WBC 20-50, RBC 1-2, many bacteria   UCX and BCX pending               PT 13.3, INR 1.1 on 04/02/21       H/o Recent CVA/Hemorrhagic Stroke       PLAN:     Appreciate overall management per medicine.   Maintain foley until clinically improved.   Continue ABX- Cefepime and Vanc   Patient will need definitive stone surgery outpatient.   Will see Monday.     Follow up arranged? Yes, intermediate pathway.           Sharlyne Pacas PA-C   [facility]   Available M-Fri, 8AM- 5PM   Pager: [phone]

## 2021-04-15 NOTE — Telephone Encounter (Signed)
Clydie Braun from Dr. Kellie Shropshire office called and Dr. Amada Jupiter wanted to talk w/ Dr. Sinclair Grooms.    Please call him back @ Tel # [phone]    Msge sent to Rebeccal Sttefens and Eugenio Hoes

## 2021-04-15 NOTE — Telephone Encounter (Signed)
Returned call and spoke with Emmie who states she works for the long term care facility where he residents. Informed I called EC Michelle to schedule follow up appt and she informed pt went back to the ER. Emmie states after the visit to the hospital pt will likely readmit t the facility and request to schedule follow up apt and she will call back to cancel if he does become admitted to the hospital Scheduled apt and call was ended.

## 2021-04-15 NOTE — Telephone Encounter (Signed)
Spoke with Dr Denton Brick.  Patient and [facility] [place] [facility] room dehydration some altered mental status.  He took his last Levaquin yesterday.  We are doing cultures and gave dose of Rocephin.  They will hydrate him up.  Currently has follow-up with [place] September 20

## 2021-04-29 ENCOUNTER — Encounter: Attending: Student in an Organized Health Care Education/Training Program | Primary: Family

## 2021-05-06 ENCOUNTER — Ambulatory Visit
Admit: 2021-05-06 | Discharge: 2021-05-06 | Payer: MEDICARE | Attending: Student in an Organized Health Care Education/Training Program | Primary: Family

## 2021-05-06 ENCOUNTER — Ambulatory Visit: Attending: Student in an Organized Health Care Education/Training Program | Primary: Family

## 2021-05-06 DIAGNOSIS — N200 Calculus of kidney: Secondary | ICD-10-CM

## 2021-05-06 LAB — AMB POC URINALYSIS DIP STICK AUTO W/O MICRO
Bilirubin (UA POC): NEGATIVE
Bilirubin, Urine, POC: NEGATIVE
Ketones (UA POC): NEGATIVE
Ketones, Urine, POC: NEGATIVE
Nitrite, Urine, POC: NEGATIVE
Nitrites (UA POC): NEGATIVE
Specific Gravity, Urine, POC: 1.025 NA (ref 1.001–1.035)
Specific gravity (UA POC): 1.025 (ref 1.001–1.035)
Urobilinogen (UA POC): 0.2 (ref 0.2–1)
Urobilinogen, POC: 0.2 (ref 0.2–1)
pH (UA POC): 5.5 (ref 4.6–8.0)
pH, Urine, POC: 5.5 NA (ref 4.6–8.0)

## 2021-05-06 NOTE — Progress Notes (Signed)
Progress  Notes by Maudry Diego, [place] at 05/06/21 1315                Author: Maudry Diego, [place]  Service: --  Author Type: Physician Assistant       Filed: 05/06/21 1549  Encounter Date: 05/06/2021  Status: Signed          Editor: Maudry Diego, PA (Physician Assistant)                    Thomas Jackson   DOB: [birth year]   Encounter Date:  05/06/2021        Encounter Diagnoses              ICD-10-CM  ICD-9-CM          1.  Kidney stone   N20.0  592.0               ASSESSMENT:    -Nephrolithiasis.    Surgical History:   S/p cysto left RPG and stent placement by Dr. Sinclair Grooms 04/03/21      Medical Therapy: Lise Auer Composition: Na   24 Urine Panel: Na      Last Imaging:    CT A/P 04/02/21: 11 mm left lower pole calculus at the left UPJ resulting in mild hydronephrosis.   RUS 04/10/21: Mild left hydronephrosis.   CT A/P 04/15/21: No hydronephrosis status post left ureteral stent placement.     CT A/P 04/18/21: Stable left ureteral stent. Nonobstructing left renal stones.       -Urosepsis, UCx 04/03/21 grew 100K E. coli      -AKI with CKD stage 4 baseline Cr ~3.5. Followed by Dr. Mercer Pod    Cr was 10.84 on 04/02/21    Most recently 5.85 on 04/18/21         PLAN:   ??  Urine sent for preop culture   ??  All questions regarding patient's upcoming surgery sought and answered.   ??  To the OR for left ureteroscopy with left JJ stent exchange for definitive stone intervention                   DISCUSSION:       Patient's BMI is out of the normal parameters.  Information about BMI was given and patient was advised to follow-up with their PCP for further management.        Chief Complaint       Patient presents with        ?  Kidney Stone             Stone pathway for kidney stones after ER.                   HISTORY OF PRESENT ILLNESS:   Thomas Jackson is a 50 y.o. male who presents today in consultation for nephrolithiasis. Patient presented to the  ED 04/02/21 due to being unrepsonsive. CT showed a 11 mm left lower pole calculus at the left  UPJ resulting in mild hydronephrosis.  Patient's CBC showed a WBC count of 23.75, potassium was 5.6. Patient has CKD and his Cr at baseline is typically ~3.5,  his BMP showed his Cr was 10.84.  Patient was transported from [facility] ED [facility] to [facility] 04/03/21. Patient is S/p cysto left RPG and stent placement by Dr. Sinclair Grooms 04/03/21. UCx grew 100K E. coli. Patient's foley was removed 04/10/21  and he was discharged 04/11/21.  Patient also has a h/o a hemorrhagic stroke 02/24/21.       Patient presents today with his girlfriend, Thomas Jackson, who provides history today. She is also the patient's POA.      Doing well today, no complaints or issues.  No discomfort currently related to the stent   Denies fevers, nausea/vomiting, gross hematuria                  AUA SS:    ;     Satisfaction Score:          Labs/Radiology:   CT Abd Pelv WO Cont. 04/18/2021   Impression:   Stable left ureteral stent. Nonobstructing left renal stones. Foley catheter. Cholelithiasis.       CT Abd Pelv WO Cont. 04/15/2021   Impression:    No hydronephrosis status post left ureteral stent placement.        RUS 04/10/2021   IMPRESSION   Mild left hydronephrosis.      CT Abd Pelv WO Cont. 04/02/2021   IMPRESSION:   1.  Previously seen nonobstructing 11 mm left lower pole calculus has migrated to the left UPJ resulting in mild hydronephrosis.   2.  Possible punctate cholelithiasis is  again demonstrated without evidence of cholecystitis.   3.  Additional findings as above.          Results for orders placed or performed in visit on 05/06/21     AMB POC URINALYSIS DIP STICK AUTO W/O MICRO         Result  Value  Ref Range            Color (UA POC)  Yellow         Clarity (UA POC)  Cloudy         Glucose (UA POC)  Trace  Negative       Bilirubin (UA POC)  Negative  Negative       Ketones (UA POC)  Negative  Negative       Specific gravity (UA POC)  1.025  1.001 - 1.035       Blood (UA POC)  2+  Negative       pH (UA POC)  5.5  4.6 - 8.0        Protein (UA POC)  3+  Negative       Urobilinogen (UA POC)  0.2 mg/dL  0.2 - 1       Nitrites (UA POC)  Negative  Negative            Leukocyte esterase (UA POC)  Trace  Negative                Past Medical History:        Diagnosis  Date         ?  CKD (chronic kidney disease) stage 4, GFR 15-29 ml/min (HCC)  04/03/2021     ?  Depression  04/03/2021     ?  Essential hypertension  04/03/2021     ?  GERD without esophagitis  04/03/2021     ?  Hypercholesteremia       ?  Hypothyroidism       ?  Intracerebral hemorrhage (HCC)  02/24/2021     ?  Intracerebral hemorrhage, nontraumatic (HCC)       ?  Metabolic encephalopathy  04/03/2021     ?  Mixed hyperlipidemia  04/03/2021     ?  Obesity  04/03/2021     ?  OSA (obstructive sleep apnea)       ?  Seizure disorder (HCC)  04/03/2021     ?  Type 2 diabetes mellitus (HCC)  04/03/2021         ?  Vitamin D deficiency  04/03/2021          Past Surgical History:         Procedure  Laterality  Date          ?  HX OTHER SURGICAL    2010          Thyroid lobectomy partial benign mass          Family History         Problem  Relation  Age of Onset          ?  Hypertension  Mother       ?  Diabetes  Mother       ?  Thyroid Disease  Mother       ?  Thyroid Disease  Father       ?  Hypertension  Father       ?  Diabetes  Father       ?  Hypertension  Sister            ?  Cancer  Sister            Social History          Socioeconomic History         ?  Marital status:  SINGLE              Spouse name:  Not on file         ?  Number of children:  Not on file     ?  Years of education:  Not on file     ?  Highest education level:  Not on file       Occupational History        ?  Not on file       Tobacco Use         ?  Smoking status:  Former              Types:  Cigarettes         Quit date:  07/12/2012         Years since quitting:  8.8         ?  Smokeless tobacco:  Never       Substance and Sexual Activity         ?  Alcohol use:  Yes     ?  Drug use:  Not Currently     ?  Sexual  activity:  Not on file        Other Topics  Concern        ?  Not on file       Social History Narrative        ?  Not on file          Social Determinants of Health          Financial Resource Strain: Not on file     Food Insecurity: Not on file     Transportation Needs: Not on file     Physical Activity: Not on file     Stress: Not on file     Social Connections: Not on file     Intimate Partner Violence: Not on file  Housing Stability: Not on file          Allergies        Allergen  Reactions         ?  Apricot  Anaphylaxis and Other (comments)     ?  Peach  Anaphylaxis and Other (comments)     ?  Amoxicillin  Other (comments)     ?  Penicillin G  Unknown (comments)     ?  Lactose  Other (comments)             Sensitivity           Current Outpatient Medications          Medication  Sig  Dispense  Refill           ?  hydrALAZINE (APRESOLINE) 100 mg tablet  Take 1 Tablet by mouth three (3) times daily.  90 Tablet  0     ?  amLODIPine (NORVASC) 10 mg tablet  Take 1 Tablet by mouth in the morning.  30 Tablet  0     ?  cyanocobalamin (VITAMIN B12) 2,500 mcg sublingual tablet  Take 1 Tablet by mouth in the morning.  30 Tablet  0     ?  ergocalciferol (ERGOCALCIFEROL) 1,250 mcg (50,000 unit) capsule  Take 1 Capsule by mouth every seven (7) days.  4 Capsule  0     ?  escitalopram oxalate (LEXAPRO) 5 mg tablet  Take 1 Tablet by mouth every evening.  30 Tablet  0     ?  levETIRAcetam (KEPPRA) 500 mg tablet  Take 1 Tablet by mouth two (2) times a day.  60 Tablet  0     ?  metoprolol tartrate (LOPRESSOR) 50 mg tablet  Take 1 Tablet by mouth two (2) times a day.  60 Tablet  0     ?  pantoprazole (PROTONIX) 40 mg tablet  Take 1 Tablet by mouth Daily (before breakfast).  30 Tablet  0     ?  rosuvastatin (CRESTOR) 10 mg tablet  Take 1 Tablet by mouth nightly.  30 Tablet  0     ?  thiamine HCL (B-1) 100 mg tablet  Take 1 Tablet by mouth in the morning.  30 Tablet  0           ?  levothyroxine (SYNTHROID) 200 mcg tablet   Take 200 mcg by mouth Daily (before breakfast).               Review of Systems   Constitutional: Fever: No   Skin: Rash: No   HEENT: Hearing difficulty: No   Eyes: Blurred vision: No   Cardiovascular: Chest pain: No   Respiratory: Shortness of breath: No   Gastrointestinal: Nausea/vomiting: No   Musculoskeletal: Back pain: No   Neurological: Weakness: No   Psychological: Memory loss: No   Comments/additional findings:       Physical Examination:   Visit Vitals      Ht  5\' 3"  (1.6 m)     Wt  245 lb (111.1 kg)        BMI  43.40 kg/m??         Constitutional: WDWN, Pleasant and appropriate affect, No acute distress.     Head: Normocephalic and atraumatic.    Eyes: No discharge. No scleral icterus.    Neck: Normal range of motion. Neck supple. No JVD present. No tracheal deviation present.    Pulmonary/Chest: Effort normal. No respiratory  distress.    Musc:normal gait and station   Psych: normal affect and mood, well groomed, appropriate answers, A&Ox3   Skin: Skin is warm and dry.           CC: Lisbeth Ply, NP       Maudry Diego, PA-C   [facility]   [address]    [place], [place] [address]   [phone] (office)         Medical documentation is provided with the assistance of Hennie Duos, medical scribe for Maudry Diego, PA on 05/06/2021

## 2021-05-08 LAB — URINE C&S

## 2021-05-09 NOTE — Telephone Encounter (Signed)
Spoke with Emmie to confirm surgery date for 10/5 at [facility] with Dr. Maple Hudson. The surgery will start at 1:30pm with a 11:30am arrival time. Pt will need covid-19 testing and is not taking any blood thinners.

## 2021-05-13 NOTE — Telephone Encounter (Signed)
Called pt's spouse in regards to pt's surgery tomorrow several times to prevent surgery from being canceled, when she answered she inquired if call is urgent. I confirmed call is urgent. She states she will give him a call back.

## 2021-05-13 NOTE — Telephone Encounter (Signed)
Spoke with Emmie and [facility] pass nurse to confirm the type of covid-19 testing that was given to patient. The facility did not preform a PCR test and will not be ready in time prior to surgery therefore surgery date for 10/5 has ben cancelled. [facility] PASS nurse also states pt will need medical clearance prior to surgery since he has a new medial history. Confirmed with [employer] pt's current pcp in the facility. Informed I will call the facility back to schedule surgery for the next available and will fax request for medical clearance to their office. She voiced understanding and call was ended.

## 2021-05-13 NOTE — Telephone Encounter (Signed)
Spoke with Emmie from [phone] and informed pt needs covid-19 testing and to return call to the hospital for any additional questions to [facility] [phone]. She voiced understanding and call was ended.

## 2021-05-14 NOTE — Telephone Encounter (Signed)
Received call from [facility] from [employer]- she said pt's surgery today has been canceled as patient did not have covid test,pre op test and clearance done.      Her tel: [phone] ext [phone]

## 2021-05-21 NOTE — Telephone Encounter (Signed)
Spoke with Emmie from [facility] and confirmed surgery date for 11/15 at [facility] with Dr. Therisa Doyne. The hospital will call pt directly with a start time a day or two prior to surgery. Pt will need covid-19 testing 72 hours 11/11 or 11/12 before surgery at any [facility] location and pt is not taking any blood thinners. Also informed medical clearance will be needed and emailed request and all this info to [email or web address].

## 2021-06-12 NOTE — Telephone Encounter (Signed)
Spoke with Emmie from [facility] and inquired if urine culture had been completed. She states he is scheduled to have this done on 11/10. Advised forit to be done today or tomorrow so if the results are positive there's enough time to treat the pt prior to surgery. She voiced understanding and states she will have the nurse complete it today or tomorrow. Also inquired about the status of the pt's medical clearance by the pcp at the facility. She states the pcp will in tomorrow and that should be faxed back on Monday. I voiced understanding and call was ended.

## 2021-07-02 ENCOUNTER — Encounter: Attending: Urology | Primary: Family

## 2021-07-08 NOTE — Telephone Encounter (Signed)
Artelia [facility] [phone]  in regards to the patient   states she would like to speak to the nurse

## 2021-07-10 ENCOUNTER — Ambulatory Visit: Admit: 2021-07-10 | Discharge: 2021-07-10 | Payer: MEDICARE | Attending: Urology | Primary: Family

## 2021-07-10 DIAGNOSIS — N200 Calculus of kidney: Secondary | ICD-10-CM

## 2021-07-10 LAB — AMB POC URINALYSIS DIP STICK AUTO W/O MICRO
Bilirubin (UA POC): NEGATIVE
Bilirubin, Urine, POC: NEGATIVE
Glucose (UA POC): NEGATIVE
Glucose, Urine, POC: NEGATIVE
Ketones (UA POC): NEGATIVE
Ketones, Urine, POC: NEGATIVE
Leukocyte Esterase, Urine, POC: NEGATIVE
Leukocyte esterase (UA POC): NEGATIVE
Nitrite, Urine, POC: NEGATIVE
Nitrites (UA POC): NEGATIVE
Specific Gravity, Urine, POC: 1.025 NA (ref 1.001–1.035)
Specific gravity (UA POC): 1.025 (ref 1.001–1.035)
Urobilinogen (UA POC): 0.2 (ref 0.2–1)
Urobilinogen, POC: 0.2 (ref 0.2–1)
pH (UA POC): 5.5 (ref 4.6–8.0)
pH, Urine, POC: 5.5 NA (ref 4.6–8.0)

## 2021-07-10 MED ORDER — TRIMETHOPRIM-SULFAMETHOXAZOLE 160 MG-800 MG TAB
160-800 mg | ORAL_TABLET | ORAL | 0 refills | Status: AC
Start: 2021-07-10 — End: 2021-07-10

## 2021-07-10 NOTE — Progress Notes (Signed)
Progress  Notes by Page Spiro, MD at 07/10/21 0915                Author: Page Spiro, MD  Service: --  Author Type: Physician       Filed: 07/10/21 1027  Encounter Date: 07/10/2021  Status: Signed          Editor: Page Spiro, MD (Physician)                    Thomas Jackson   DOB: Nov 16, 1970    Encounter Date: 07/10/2021          ASSESSMENT/PLAN:    No diagnosis found.       -Nephrolithiasis.    Surgical History:   S/p cysto left RPG and stent placement by Dr. Sinclair Grooms 04/03/21   S/p cystoscopy, RPG, left URS w/ LL and left stent exchange on 06/24/21 by Dr. Therisa Doyne        Medical Therapy: Ian Bushman   Stone Composition: 06/24/21: 1005 CaOx   24 Urine Panel: Na       Last Imaging:    CT A/P 04/02/21: 11 mm left lower pole calculus at the left UPJ resulting in mild hydronephrosis.   RUS 04/10/21: Mild left hydronephrosis.   CT A/P 04/15/21: No hydronephrosis status post left ureteral stent placement.     CT A/P 04/18/21: Stable left ureteral stent. Nonobstructing left renal stones.          -Urosepsis, UCx 04/03/21 grew 100K E. coli         -AKI with CKD stage 4 baseline Cr ~3.5. Followed by Dr. Mercer Pod               Cr was 10.84 on 04/02/21               Most recently 5.85 on 04/18/21          PLAN:   ??  UA 07/10/21: 2+ blood   ??  Left stent removed 07/10/21   ??  Check litholink 24 hr urine   ??  Follow up as scheduled with Marlin, [place].            No chief complaint on file.      HISTORY OF PRESENT ILLNESS:  Thomas Jackson is a 50 y.o. male who presents in follow up for kidney stones.       Previous GU History:   Seen on 05/06/21 by Maudry Diego, PA. Patient presented to the ED 04/02/21 due to being unrepsonsive. CT showed a 11 mm left lower pole calculus at the left UPJ resulting in mild hydronephrosis.  Patient's  CBC showed a WBC count of 23.75, potassium was 5.6. Patient has CKD and his Cr at baseline is typically ~3.5, his BMP showed his Cr was 10.84.  Patient was transported from [facility] ED in [facility] to [facility]  04/03/21. Patient is S/p cysto  left RPG and stent placement by Dr. Sinclair Grooms 04/03/21. UCx grew 100K E. coli. Patient's foley was removed 04/10/21 and he was discharged 04/11/21.        Patient also has a h/o a hemorrhagic stroke 02/24/21.        Patient presents today with his girlfriend, Thomas Jackson, who provides history today. She is also the patient's POA.       Doing well today, no complaints or issues.  No discomfort currently related to the stent   Denies fevers, nausea/vomiting, gross hematuria  Interval History:   S/p cystoscopy, RPG, left URS w/ LL and left stent exchange on 06/24/21 by Dr. Rob Hickman Analysis 06/24/21: 100% CaOx       Patient presents today for cysto stent removal, see separate procedure note.                   No flowsheet data found.         Past Medical History:        Diagnosis  Date         ?  CKD (chronic kidney disease) stage 4, GFR 15-29 ml/min (HCC)  04/03/2021     ?  Depression  04/03/2021     ?  Essential hypertension  04/03/2021     ?  GERD without esophagitis  04/03/2021     ?  Hypercholesteremia       ?  Hypothyroidism       ?  Intracerebral hemorrhage (HCC)  02/24/2021     ?  Intracerebral hemorrhage, nontraumatic (HCC)       ?  Metabolic encephalopathy  04/03/2021     ?  Mixed hyperlipidemia  04/03/2021     ?  Obesity  04/03/2021     ?  OSA (obstructive sleep apnea)       ?  Seizure disorder (HCC)  04/03/2021     ?  Type 2 diabetes mellitus (HCC)  04/03/2021         ?  Vitamin D deficiency  04/03/2021          Past Surgical History:         Procedure  Laterality  Date          ?  HX OTHER SURGICAL    2010          Thyroid lobectomy partial benign mass          Family History         Problem  Relation  Age of Onset          ?  Hypertension  Mother       ?  Diabetes  Mother       ?  Thyroid Disease  Mother       ?  Thyroid Disease  Father       ?  Hypertension  Father       ?  Diabetes  Father       ?  Hypertension  Sister            ?  Cancer  Sister            Social History           Socioeconomic History         ?  Marital status:  SINGLE              Spouse name:  Not on file         ?  Number of children:  Not on file     ?  Years of education:  Not on file     ?  Highest education level:  Not on file       Occupational History        ?  Not on file       Tobacco Use         ?  Smoking status:  Former              Types:  Cigarettes         Quit date:  07/12/2012         Years since quitting:  8.9         ?  Smokeless tobacco:  Never       Substance and Sexual Activity         ?  Alcohol use:  Yes     ?  Drug use:  Not Currently     ?  Sexual activity:  Not on file        Other Topics  Concern        ?  Not on file       Social History Narrative        ?  Not on file          Social Determinants of Health          Financial Resource Strain: Not on file     Food Insecurity: Not on file     Transportation Needs: Not on file     Physical Activity: Not on file     Stress: Not on file     Social Connections: Not on file     Intimate Partner Violence: Not on file       Housing Stability: Not on file          Current Outpatient Medications        Medication  Sig         ?  levothyroxine (SYNTHROID) 200 mcg tablet  Take 200 mcg by mouth Daily (before breakfast).     ?  hydrALAZINE (APRESOLINE) 100 mg tablet  Take 1 Tablet by mouth three (3) times daily.     ?  amLODIPine (NORVASC) 10 mg tablet  Take 1 Tablet by mouth in the morning.     ?  cyanocobalamin (VITAMIN B12) 2,500 mcg sublingual tablet  Take 1 Tablet by mouth in the morning.     ?  ergocalciferol (ERGOCALCIFEROL) 1,250 mcg (50,000 unit) capsule  Take 1 Capsule by mouth every seven (7) days.     ?  escitalopram oxalate (LEXAPRO) 5 mg tablet  Take 1 Tablet by mouth every evening.     ?  levETIRAcetam (KEPPRA) 500 mg tablet  Take 1 Tablet by mouth two (2) times a day.     ?  metoprolol tartrate (LOPRESSOR) 50 mg tablet  Take 1 Tablet by mouth two (2) times a day.     ?  pantoprazole (PROTONIX) 40 mg tablet  Take 1 Tablet by mouth Daily  (before breakfast).     ?  rosuvastatin (CRESTOR) 10 mg tablet  Take 1 Tablet by mouth nightly.         ?  thiamine HCL (B-1) 100 mg tablet  Take 1 Tablet by mouth in the morning.          No current facility-administered medications for this visit.          Allergies        Allergen  Reactions         ?  Apricot  Anaphylaxis and Other (comments)     ?  Peach  Anaphylaxis and Other (comments)     ?  Amoxicillin  Other (comments)     ?  Penicillin G  Unknown (comments)     ?  Lactose  Other (comments)             Sensitivity  Review of Systems   Constitutional: Fever:    Skin: Rash:    HEENT: Hearing difficulty:    Eyes: Blurred vision:    Cardiovascular: Chest pain:    Respiratory: Shortness of breath:    Gastrointestinal: Nausea/vomiting:    Musculoskeletal: Back pain:    Neurological: Weakness:    Psychological: Memory loss:    Comments/additional findings:             Physical Exam:        Estimated body mass index is 43.4 kg/m?? as calculated from the following:     Height as of 05/06/21: 5\' 3"  (1.6 m).     Weight as of 05/06/21: 245 lb (111.1 kg).    Head: Normocephalic, without obvious abnormality, atraumatic   Eyes: negative   Neck: supple, symmetrical, trachea midline   Chest:  Normal respiratory effort and excursions.    Back: symmetric, no curvature. ROM normal. No CVA tenderness.   Abdomen: soft, non-tender. No masses,  no organomegaly   Extremities: extremities normal, atraumatic, no cyanosis or edema   Skin: Skin color, texture, turgor normal. No rashes or lesions   Neurologic: Grossly normal      LABS:     Results for orders placed or performed in visit on 05/06/21     URINE C&S          Specimen: Urine         Result  Value  Ref Range            FINAL REPORT  Microbiology results         AMB POC URINALYSIS DIP STICK AUTO W/O MICRO         Result  Value  Ref Range            Color (UA POC)  Yellow         Clarity (UA POC)  Cloudy         Glucose (UA POC)  Trace  Negative       Bilirubin (UA POC)   Negative  Negative       Ketones (UA POC)  Negative  Negative       Specific gravity (UA POC)  1.025  1.001 - 1.035       Blood (UA POC)  2+  Negative       pH (UA POC)  5.5  4.6 - 8.0       Protein (UA POC)  3+  Negative            Urobilinogen (UA POC)  0.2 mg/dL  0.2 - 1            Nitrites (UA POC)  Negative  Negative            Leukocyte esterase (UA POC)  Trace  Negative           PSA Trend:     Lab Results         Component  Value  Date/Time            Prostate Specific Ag  0.8  08/29/2020 12:00 AM              IMAGING:      CT Abd Pelv WO Cont. 04/18/2021   Impression:   Stable left ureteral stent. Nonobstructing left renal stones. Foley catheter. Cholelithiasis.        CT Abd Pelv WO Cont. 04/15/2021   Impression:    No hydronephrosis status post left ureteral stent  placement.         RUS 04/10/2021   IMPRESSION   Mild left hydronephrosis.       CT Abd Pelv WO Cont. 04/02/2021   IMPRESSION:   1.  Previously seen nonobstructing 11 mm left lower pole calculus has  migrated to the left UPJ resulting in mild hydronephrosis.   2.  Possible punctate cholelithiasis is again demonstrated without evidence  of cholecystitis.   3.  Additional findings as above.          Page Spiro, MD, FACS      This note has been sent to the referring physician, with findings and recommendations.      CC:   Lisbeth Ply, NP   [address]   [place] [address]      Medical documentation provided with assistance by Elisha Headland, medical scribe for Elisha Headland on 07/10/2021.

## 2021-07-10 NOTE — Procedures (Signed)
Cystoscopy with Stent Removal     Patient: Thomas Jackson  Date: 07/10/2021        Consent:  All risks, benefits and options were reviewed in detail and the patient agrees to procedure. Risks include but are not limited to bleeding, infection, sepsis, death, dysuria and others.      Patient placed in appropriate position and prepped/draped in sterile fashion, 10cc lidocaine gel placed into urethra  Exam   Scope flexible   Lidocaine Jelly Yes  Once adequate anesthesia was achieved, the flexible cystoscope was passed into the bladder under direct visualization.   Meatus normal   Urethra normal   Prostate obstructing:  Mild   Bladder Neck open   Trigone Normal   Comment: Left stent successfully removed.      Antibiotic/Meds Bactrim DS x 1 dose     Page Spiro, MD, FACS      Universal Procedure Pause:    1. Correct patient confirmed:  yes    2. Allergies confirmed:  yes    3. Patient taking antiplatelet medications:  no    4. Patient taking anticoagulants:  no    5. Correct procedure and side confirmed and consent signed:  yes    6. Correct antibiotics confirmed:  yes

## 2021-07-15 NOTE — Telephone Encounter (Signed)
I called M.Turner back and left a message for a call back regarding patient.

## 2021-08-05 ENCOUNTER — Encounter: Primary: Family

## 2021-08-25 ENCOUNTER — Encounter: Primary: Family

## 2021-09-04 ENCOUNTER — Encounter: Primary: Family

## 2023-09-24 ENCOUNTER — Encounter: Attending: Physician Assistant | Primary: Family

## 2023-11-09 DEATH — deceased

## 2023-11-30 ENCOUNTER — Encounter: Attending: Student in an Organized Health Care Education/Training Program | Primary: Family
